# Patient Record
Sex: Male | Born: 1945 | Race: White | Hispanic: No | Marital: Married | Smoking: Former smoker
Health system: Southern US, Community
[De-identification: ages and names within clinical notes are randomized; demographics above are authoritative.]

## PROBLEM LIST (undated history)

## (undated) ENCOUNTER — Emergency Department (HOSPITAL_BASED_OUTPATIENT_CLINIC_OR_DEPARTMENT_OTHER): Payer: Medicare Other

## (undated) DIAGNOSIS — Z9981 Dependence on supplemental oxygen: Secondary | ICD-10-CM

## (undated) DIAGNOSIS — M1990 Unspecified osteoarthritis, unspecified site: Secondary | ICD-10-CM

## (undated) DIAGNOSIS — IMO0001 Reserved for inherently not codable concepts without codable children: Secondary | ICD-10-CM

## (undated) DIAGNOSIS — G2581 Restless legs syndrome: Secondary | ICD-10-CM

## (undated) DIAGNOSIS — N200 Calculus of kidney: Secondary | ICD-10-CM

## (undated) DIAGNOSIS — I4892 Unspecified atrial flutter: Secondary | ICD-10-CM

## (undated) DIAGNOSIS — J449 Chronic obstructive pulmonary disease, unspecified: Secondary | ICD-10-CM

## (undated) HISTORY — PX: KNEE SURGERY: SHX244

---

## 2002-03-29 ENCOUNTER — Ambulatory Visit (HOSPITAL_COMMUNITY): Admission: RE | Admit: 2002-03-29 | Discharge: 2002-03-29 | Payer: Self-pay | Admitting: Family Medicine

## 2002-03-29 ENCOUNTER — Encounter: Payer: Self-pay | Admitting: Family Medicine

## 2003-12-20 ENCOUNTER — Ambulatory Visit (HOSPITAL_COMMUNITY): Admission: RE | Admit: 2003-12-20 | Discharge: 2003-12-20 | Payer: Self-pay | Admitting: Family Medicine

## 2005-08-06 ENCOUNTER — Ambulatory Visit (HOSPITAL_COMMUNITY): Admission: RE | Admit: 2005-08-06 | Discharge: 2005-08-06 | Payer: Self-pay | Admitting: Family Medicine

## 2007-02-26 ENCOUNTER — Ambulatory Visit (HOSPITAL_COMMUNITY): Admission: RE | Admit: 2007-02-26 | Discharge: 2007-02-26 | Payer: Self-pay | Admitting: Family Medicine

## 2007-03-10 ENCOUNTER — Ambulatory Visit (HOSPITAL_COMMUNITY): Admission: RE | Admit: 2007-03-10 | Discharge: 2007-03-10 | Payer: Self-pay | Admitting: Family Medicine

## 2008-04-24 ENCOUNTER — Ambulatory Visit (HOSPITAL_COMMUNITY): Admission: RE | Admit: 2008-04-24 | Discharge: 2008-04-24 | Payer: Self-pay | Admitting: Family Medicine

## 2010-07-24 ENCOUNTER — Ambulatory Visit (HOSPITAL_COMMUNITY)
Admission: RE | Admit: 2010-07-24 | Discharge: 2010-07-24 | Disposition: A | Discharge status: Home / Self Care | Payer: BC Managed Care – PPO | Source: Ambulatory Visit | Attending: Internal Medicine | Admitting: Internal Medicine

## 2010-07-24 ENCOUNTER — Encounter (HOSPITAL_COMMUNITY): Payer: Self-pay

## 2010-07-24 ENCOUNTER — Other Ambulatory Visit (HOSPITAL_COMMUNITY): Payer: Self-pay | Admitting: Internal Medicine

## 2010-07-24 DIAGNOSIS — R059 Cough, unspecified: Secondary | ICD-10-CM | POA: Insufficient documentation

## 2010-07-24 DIAGNOSIS — J438 Other emphysema: Secondary | ICD-10-CM

## 2010-07-24 DIAGNOSIS — R05 Cough: Secondary | ICD-10-CM | POA: Insufficient documentation

## 2010-07-24 DIAGNOSIS — R0602 Shortness of breath: Secondary | ICD-10-CM | POA: Insufficient documentation

## 2010-07-24 HISTORY — DX: Chronic obstructive pulmonary disease, unspecified: J44.9

## 2010-07-26 ENCOUNTER — Other Ambulatory Visit (HOSPITAL_COMMUNITY): Payer: Self-pay | Admitting: Internal Medicine

## 2010-07-26 DIAGNOSIS — Z0389 Encounter for observation for other suspected diseases and conditions ruled out: Secondary | ICD-10-CM

## 2010-07-26 DIAGNOSIS — R61 Generalized hyperhidrosis: Secondary | ICD-10-CM

## 2010-07-30 ENCOUNTER — Encounter (HOSPITAL_COMMUNITY): Payer: Self-pay

## 2010-07-30 ENCOUNTER — Ambulatory Visit (HOSPITAL_COMMUNITY)
Admission: RE | Admit: 2010-07-30 | Discharge: 2010-07-30 | Disposition: A | Discharge status: Home / Self Care | Payer: BC Managed Care – PPO | Source: Ambulatory Visit | Attending: Internal Medicine | Admitting: Internal Medicine

## 2010-07-30 DIAGNOSIS — Z0389 Encounter for observation for other suspected diseases and conditions ruled out: Secondary | ICD-10-CM

## 2010-07-30 DIAGNOSIS — R61 Generalized hyperhidrosis: Secondary | ICD-10-CM

## 2010-07-30 DIAGNOSIS — R9389 Abnormal findings on diagnostic imaging of other specified body structures: Secondary | ICD-10-CM | POA: Insufficient documentation

## 2010-08-05 ENCOUNTER — Other Ambulatory Visit (HOSPITAL_COMMUNITY): Payer: Self-pay | Admitting: Internal Medicine

## 2010-08-05 DIAGNOSIS — R634 Abnormal weight loss: Secondary | ICD-10-CM

## 2010-08-05 DIAGNOSIS — R9389 Abnormal findings on diagnostic imaging of other specified body structures: Secondary | ICD-10-CM

## 2010-08-09 ENCOUNTER — Ambulatory Visit (HOSPITAL_COMMUNITY)
Admission: RE | Admit: 2010-08-09 | Discharge: 2010-08-09 | Disposition: A | Discharge status: Home / Self Care | Payer: BC Managed Care – PPO | Source: Ambulatory Visit | Attending: Internal Medicine | Admitting: Internal Medicine

## 2010-08-09 DIAGNOSIS — J449 Chronic obstructive pulmonary disease, unspecified: Secondary | ICD-10-CM | POA: Insufficient documentation

## 2010-08-09 DIAGNOSIS — R9389 Abnormal findings on diagnostic imaging of other specified body structures: Secondary | ICD-10-CM

## 2010-08-09 DIAGNOSIS — R634 Abnormal weight loss: Secondary | ICD-10-CM

## 2010-08-09 DIAGNOSIS — J4489 Other specified chronic obstructive pulmonary disease: Secondary | ICD-10-CM | POA: Insufficient documentation

## 2010-08-09 DIAGNOSIS — F17200 Nicotine dependence, unspecified, uncomplicated: Secondary | ICD-10-CM | POA: Insufficient documentation

## 2010-08-09 DIAGNOSIS — R0609 Other forms of dyspnea: Secondary | ICD-10-CM | POA: Insufficient documentation

## 2010-08-09 DIAGNOSIS — R0989 Other specified symptoms and signs involving the circulatory and respiratory systems: Secondary | ICD-10-CM | POA: Insufficient documentation

## 2010-08-13 ENCOUNTER — Telehealth: Payer: Self-pay

## 2010-08-13 DIAGNOSIS — Z139 Encounter for screening, unspecified: Secondary | ICD-10-CM

## 2010-08-15 ENCOUNTER — Telehealth: Payer: Self-pay

## 2010-08-15 NOTE — Telephone Encounter (Signed)
Gastroenterology Pre-Procedure Form  Request Date: 08/15/2010,  Requesting Physician: Dr. Sherwood Gambler     PATIENT INFORMATION:  David Stein is a 65 y.o., male (DOB=Jul 29, 1945).  PROCEDURE: Procedure(s) requested: colonoscopy Procedure Reason: screening for colon cancer  PATIENT REVIEW QUESTIONS: The patient reports the following:   1. Diabetes Melitis: no 2. Joint replacements in the past 12 months: no 3. Major health problems in the past 3 months: no 4. Has an artificial valve or MVP:no 5. Has been advised in past to take antibiotics in advance of a procedure like teeth cleaning: no}    MEDICATIONS & ALLERGIES:    Patient reports the following regarding taking any blood thinners:   Plavix? no Aspirin?no Coumadin?  no  Patient confirms/reports the following medications:  Current Outpatient Prescriptions  Medication Sig Dispense Refill  . albuterol (PROVENTIL) (2.5 MG/3ML) 0.083% nebulizer solution Take 2.5 mg by nebulization every 6 (six) hours as needed.        . predniSONE (DELTASONE) 10 MG tablet pack Take 10 mg by mouth daily.        . predniSONE (DELTASONE) 5 MG tablet Take 5 mg by mouth daily. Pt unsure of the dosage       . senna (SENOKOT) 8.6 MG tablet Take 1 tablet by mouth daily.          Patient confirms/reports the following allergies:  No Known Allergies  Patient is appropriate to schedule for requested procedure(s): yes  AUTHORIZATION INFORMATION Primary Insurance: ,  ID #:,  Group #: Pre-Cert / Auth required: Pre-Cert / Auth #:  Secondary Insurance Pre-Cert / Auth required: Pre-Cert / Auth #:  No orders of the defined types were placed in this encounter.    SCHEDULE INFORMATION: Procedure has been scheduled as follows:  Date: 08/20/2010, Time: 12:30 pm  Location: [facility] Short Stay  This Gastroenterology Pre-Precedure Form is being routed to the following provider(s) for review: R. Roetta Sessions, MD

## 2010-08-15 NOTE — Telephone Encounter (Signed)
ERROR. ALREADY HAD ENCOUNTER OPEN FOR THE TRIAGE FOR THIS PT.

## 2010-08-15 NOTE — Telephone Encounter (Signed)
OK to proceed with colonoscopy.

## 2010-08-16 ENCOUNTER — Encounter: Payer: Self-pay | Admitting: Internal Medicine

## 2010-08-20 ENCOUNTER — Other Ambulatory Visit: Payer: Self-pay | Admitting: Internal Medicine

## 2010-08-20 ENCOUNTER — Ambulatory Visit (HOSPITAL_COMMUNITY)
Admission: RE | Admit: 2010-08-20 | Discharge: 2010-08-20 | Disposition: A | Discharge status: Home / Self Care | Payer: BC Managed Care – PPO | Source: Ambulatory Visit | Attending: Internal Medicine | Admitting: Internal Medicine

## 2010-08-20 ENCOUNTER — Encounter: Payer: BC Managed Care – PPO | Admitting: Internal Medicine

## 2010-08-20 DIAGNOSIS — Z1211 Encounter for screening for malignant neoplasm of colon: Secondary | ICD-10-CM

## 2010-08-20 DIAGNOSIS — J4489 Other specified chronic obstructive pulmonary disease: Secondary | ICD-10-CM | POA: Insufficient documentation

## 2010-08-20 DIAGNOSIS — D126 Benign neoplasm of colon, unspecified: Secondary | ICD-10-CM

## 2010-08-20 DIAGNOSIS — J449 Chronic obstructive pulmonary disease, unspecified: Secondary | ICD-10-CM | POA: Insufficient documentation

## 2010-08-29 ENCOUNTER — Encounter: Payer: Self-pay | Admitting: Internal Medicine

## 2010-10-07 NOTE — Op Note (Signed)
  NAME:  David Stein, David Stein                 ACCOUNT NO.:  [id number]  MEDICAL RECORD NO.:  [id number]           PATIENT TYPE:  O  LOCATION:  [facility]                          FACILITY:  [facility]  PHYSICIAN:  R. Roetta Sessions, M.D. DATE OF BIRTH:  January 01, 1946  DATE OF PROCEDURE:  08/20/2010 DATE OF DISCHARGE:                              OPERATIVE REPORT   PROCEDURE:  Colonoscopy with biopsy and colonoscopy with snare polypectomy.  INDICATIONS FOR PROCEDURE:  A 65-year gentleman who presents in referral by Dr. Karleen Hampshire, for his first ever screening colonoscopy.  Mr. Mccaslin is devoid of any lower GI tract symptoms.  No family history of colon cancer biopsy of first or secondary relatives.  Colonoscopy is now being done.  Risks, benefits, limitations, alternatives, and imponderables have been discussed, questions answered.  Please see the documentation medical record.  PROCEDURE NOTE:  O2 saturation, blood pressure, pulse, and respirationsmonitored throughout the entire procedure.  CONSCIOUS SEDATION:  Versed 3 mg IV and Demerol 75 mg IV in divided doses.  INSTRUMENT:  Pentax video chip system.  Digital rectal exam revealed no abnormalities.  Endoscopic findings: The prep was good.  Colon:  Pancolonic mucosa was surveyed from the rectosigmoid junction through the left transverse right colon to the appendiceal orifice, ileocecal valve/cecum.  These structures were well seen and photographed for the record.  From this level, scope was slowly and cautiously withdrawn.  All previously mentioned mucosal surfaces were again seen.  On the way in the mid transverse colon, there was a 5- minute polyp was cold snared, recovered just distal to ileocecal valve, there was a single diminutive polyp was cold biopsied.  Remainder of the colonic mucosa appeared normal.  Scope was pulled down the rectum where thorough examination of the rectal mucosa including retroflex view of anal verge demonstrated no  abnormalities.  The patient tolerated the procedure well.  Cecal withdrawal time 10 minutes.  IMPRESSION: 1. Normal rectum. 2. Polyps in the ascending and transverse segments removed as     described above.  RECOMMENDATIONS: 1. Literature on polyps provided to Mr. Shrewsberry. 2. Follow up on path. 3. Further recommendations to follow.     Jonathon Bellows, M.D.     RMR/MEDQ  D:  08/20/2010  T:  08/21/2010  Job:  [id number]  cc:   Kirk Ruths, M.D. Fax: [phone]  Electronically Signed by Lorrin Goodell M.D. on 10/07/2010 08:35:10 AM

## 2011-01-20 ENCOUNTER — Ambulatory Visit (HOSPITAL_COMMUNITY)
Admission: RE | Admit: 2011-01-20 | Discharge: 2011-01-20 | Disposition: A | Discharge status: Home / Self Care | Payer: Medicare Other | Source: Ambulatory Visit | Attending: Family Medicine | Admitting: Family Medicine

## 2011-01-20 ENCOUNTER — Other Ambulatory Visit (HOSPITAL_COMMUNITY): Payer: Self-pay | Admitting: Family Medicine

## 2011-01-20 DIAGNOSIS — R0989 Other specified symptoms and signs involving the circulatory and respiratory systems: Secondary | ICD-10-CM | POA: Insufficient documentation

## 2011-01-20 DIAGNOSIS — J449 Chronic obstructive pulmonary disease, unspecified: Secondary | ICD-10-CM

## 2011-01-20 DIAGNOSIS — J209 Acute bronchitis, unspecified: Secondary | ICD-10-CM

## 2011-01-20 DIAGNOSIS — J40 Bronchitis, not specified as acute or chronic: Secondary | ICD-10-CM | POA: Insufficient documentation

## 2011-01-20 DIAGNOSIS — J438 Other emphysema: Secondary | ICD-10-CM | POA: Insufficient documentation

## 2011-01-20 DIAGNOSIS — R0609 Other forms of dyspnea: Secondary | ICD-10-CM | POA: Insufficient documentation

## 2011-01-20 DIAGNOSIS — J4489 Other specified chronic obstructive pulmonary disease: Secondary | ICD-10-CM

## 2011-03-12 ENCOUNTER — Other Ambulatory Visit (HOSPITAL_COMMUNITY): Payer: Self-pay | Admitting: Pulmonary Disease

## 2011-03-14 ENCOUNTER — Ambulatory Visit (HOSPITAL_COMMUNITY)
Admission: RE | Admit: 2011-03-14 | Discharge: 2011-03-14 | Disposition: A | Discharge status: Home / Self Care | Payer: Medicare Other | Source: Ambulatory Visit | Attending: Pulmonary Disease | Admitting: Pulmonary Disease

## 2011-03-14 DIAGNOSIS — R0602 Shortness of breath: Secondary | ICD-10-CM | POA: Insufficient documentation

## 2011-03-14 LAB — BLOOD GAS, ARTERIAL
Acid-Base Excess: 3.2 mmol/L — ABNORMAL HIGH (ref 0.0–2.0)
Patient temperature: 37
TCO2: 23.4 mmol/L (ref 0–100)

## 2011-03-14 MED ORDER — ALBUTEROL SULFATE (5 MG/ML) 0.5% IN NEBU
2.5000 mg | INHALATION_SOLUTION | Freq: Once | RESPIRATORY_TRACT | Status: AC
  Administered 2011-03-14: 2.5 mg via RESPIRATORY_TRACT

## 2011-03-14 MED ORDER — ALBUTEROL SULFATE (5 MG/ML) 0.5% IN NEBU: 2.5000 mg | INHALATION_SOLUTION | Freq: Once | RESPIRATORY_TRACT | Status: DC

## 2011-03-17 NOTE — Procedures (Signed)
David Stein, David Stein                 ACCOUNT NO.:  [id number]  MEDICAL RECORD NO.:  [id number]  LOCATION:                                 FACILITY:  PHYSICIAN:  Chrisann Melaragno L. Juanetta Gosling, M.D.DATE OF BIRTH:  Nov 06, 1945  DATE OF PROCEDURE: DATE OF DISCHARGE:                           PULMONARY FUNCTION TEST   Reason for pulmonary function testing is shortness of breath.  1. Spirometry shows severe ventilatory defect with airflow     obstruction. 2. Lung volumes show air trapping. 3. DLCO is severely reduced. 4. Arterial blood gas shows borderline hypoxia and CO2 is normal. 5. There is significant bronchodilator improvement. 6. This is consistent with COPD.     Brionne Mertz L. Juanetta Gosling, M.D.     ELH/MEDQ  D:  03/17/2011  T:  03/17/2011  Job:  [id number]

## 2012-09-12 IMAGING — CT CT CHEST W/O CM
2 of 3 series · 15 of 36 positions shown, 18 images · non-contrast
Comparison: None

CLINICAL DATA: COPD, difficulty breathing, smoker

CT CHEST WITHOUT CONTRAST
TECHNIQUE: Multidetector CT imaging of the chest was performed
following the standard protocol without IV contrast.

[Series 2: chestroutine 5.0 b40f · axial · 0.66mm/px · z∈[-404,-79]mm · 12 of 77 slices shown, 15 images]
[im 6/77  mediastinal]
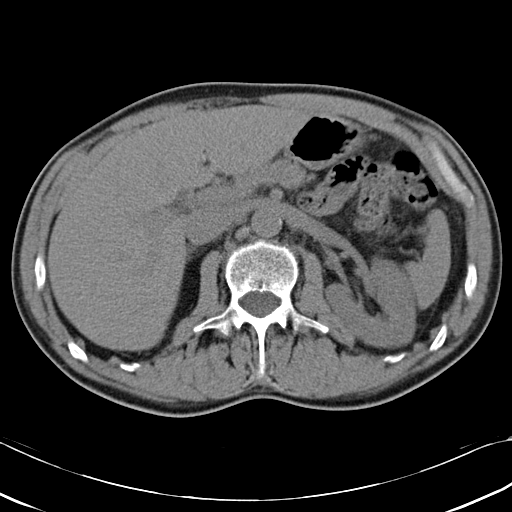
[im 6/77  lung]
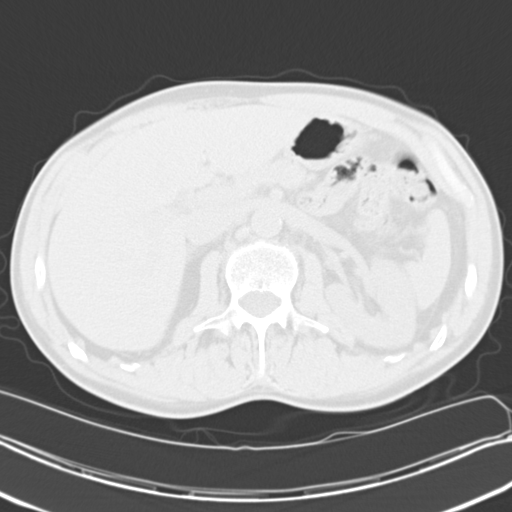
[im 12/77  lung]
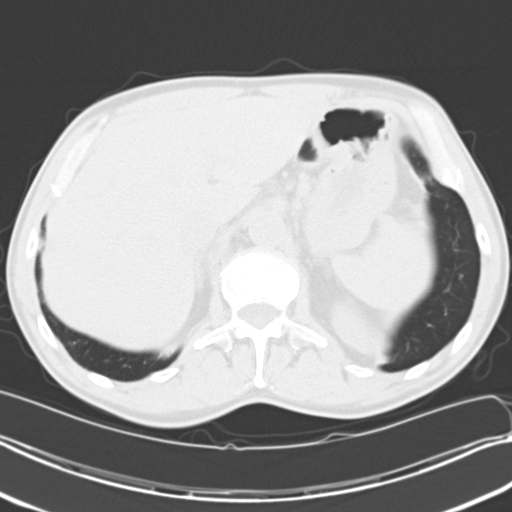
[im 17/77  lung]
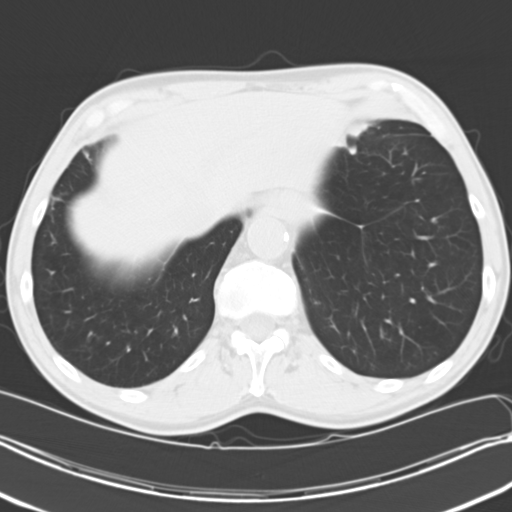
[im 23/77  lung]
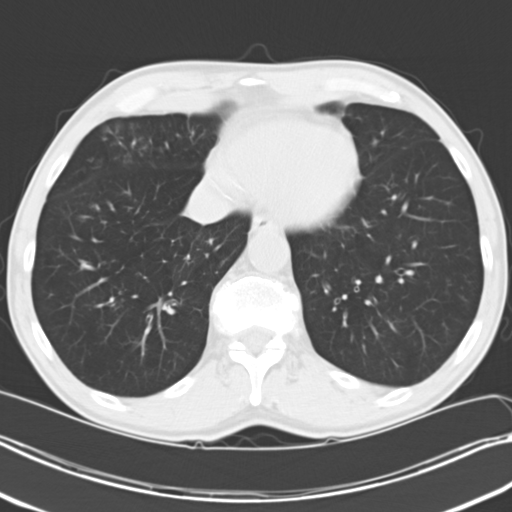
[im 29/77  mediastinal]
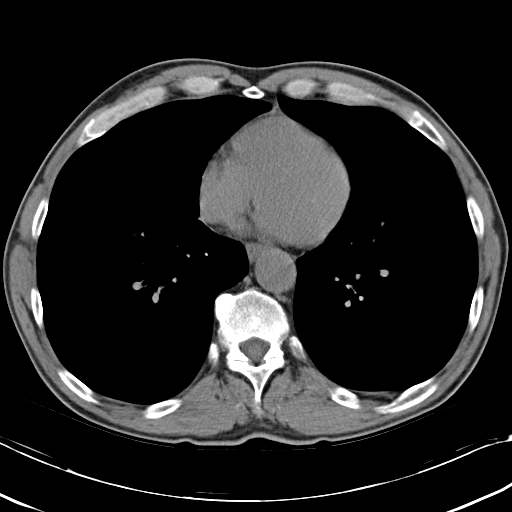
[im 29/77  lung]
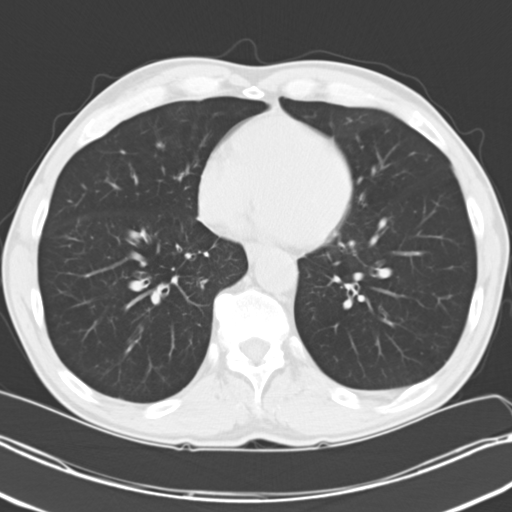
[im 34/77  lung]
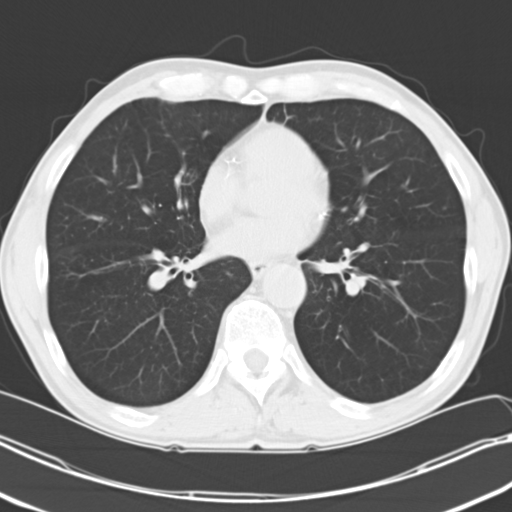
[im 43/77  lung]
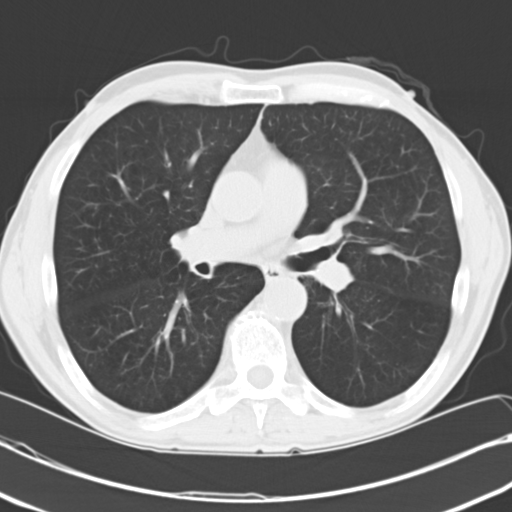
[im 48/77  lung]
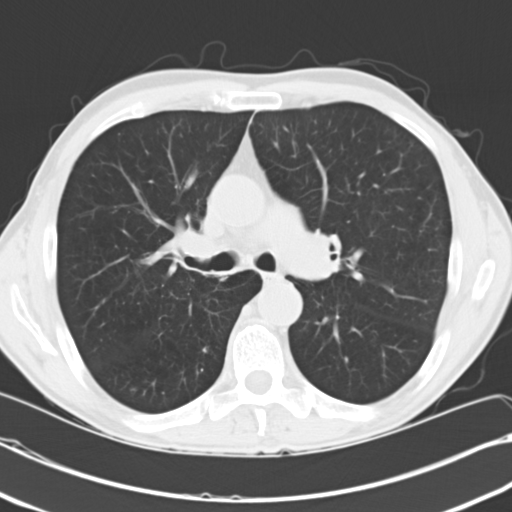
[im 54/77  mediastinal]
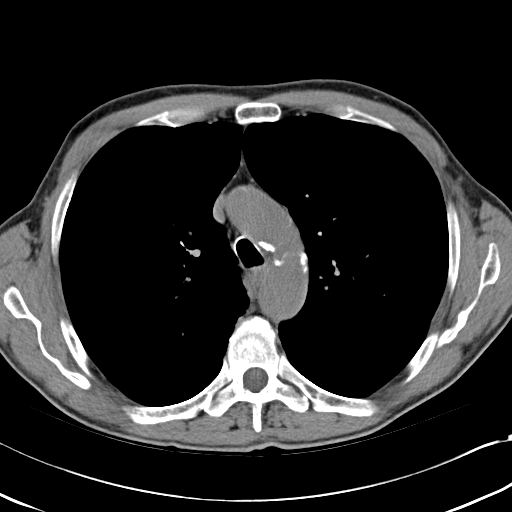
[im 54/77  lung]
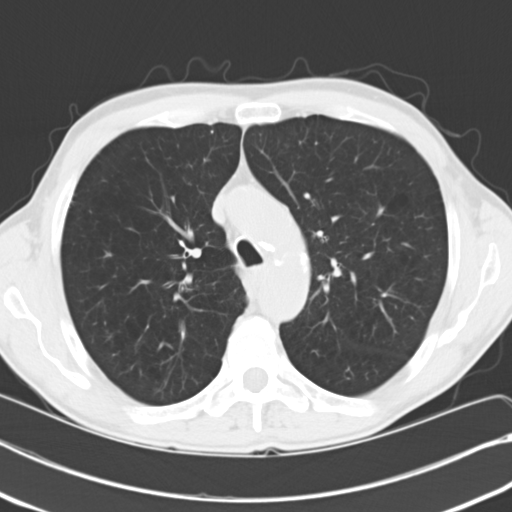
[im 60/77  lung]
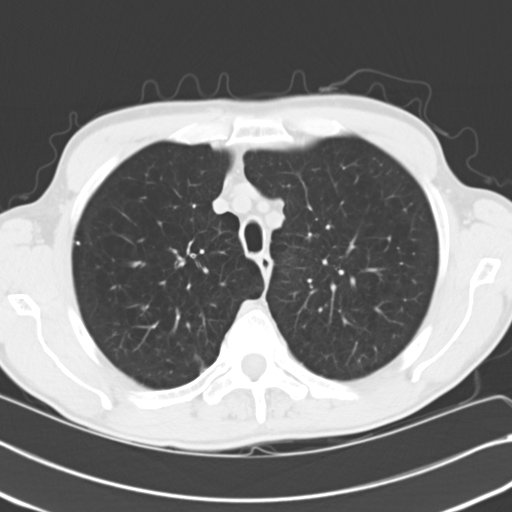
[im 65/77  lung]
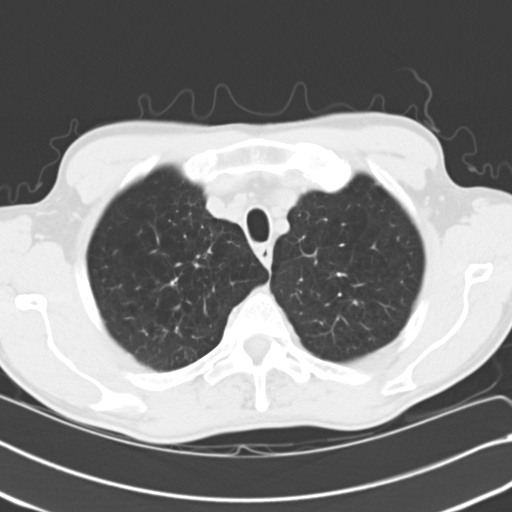
[im 71/77  lung]
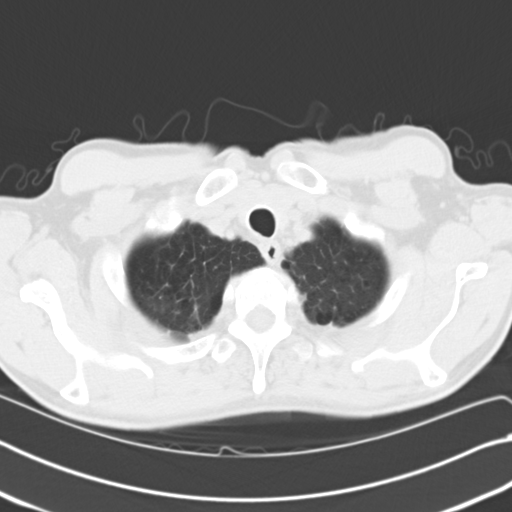

[Series 4: mpr coro 3mm · coronal · 0.67mm/px · 3 of 81 slices shown]
[im 17/81  lung]
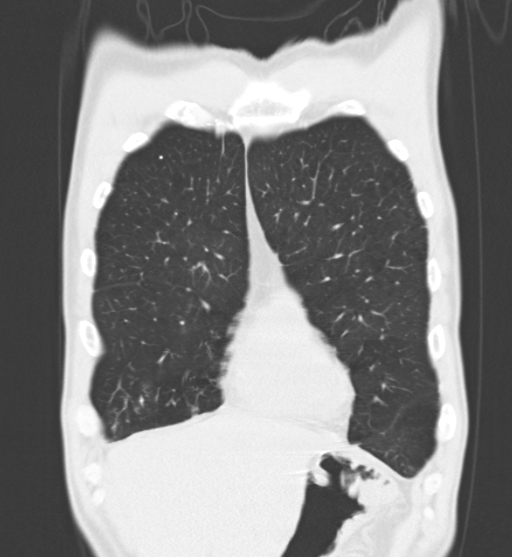
[im 33/81  lung]
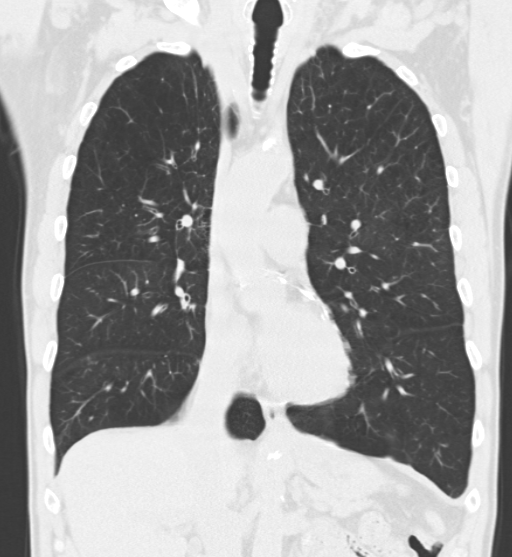
[im 49/81  lung]
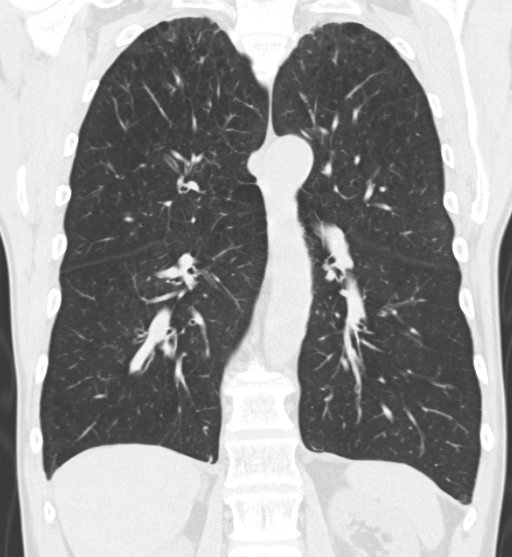

[15 of 36 positions shown; findings below may reference images not displayed]

FINDINGS: Scattered atherosclerotic calcifications of aorta and coronary
arteries.
Thoracic aorta normal caliber.
Visualized portion of upper abdomen normal.
Numerous tiny calcified pulmonary granulomata bilaterally.
Few additional tiny nodules are questionably calcified.
Severe emphysematous changes.
Scattered peribronchial thickening.
No definite infiltrate, effusion, or mass.
No acute osseous findings.
IMPRESSION: Emphysematous and bronchitic changes.
Old granulomatous disease changes.
No acute intrathoracic abnormalities.

## 2012-09-28 ENCOUNTER — Other Ambulatory Visit (HOSPITAL_COMMUNITY): Payer: Self-pay | Admitting: Pulmonary Disease

## 2012-09-28 ENCOUNTER — Ambulatory Visit (HOSPITAL_COMMUNITY)
Admission: RE | Admit: 2012-09-28 | Discharge: 2012-09-28 | Disposition: A | Discharge status: Home / Self Care | Payer: Medicare Other | Source: Ambulatory Visit | Attending: Pulmonary Disease | Admitting: Pulmonary Disease

## 2012-09-28 DIAGNOSIS — M7989 Other specified soft tissue disorders: Secondary | ICD-10-CM

## 2012-09-28 DIAGNOSIS — M25579 Pain in unspecified ankle and joints of unspecified foot: Secondary | ICD-10-CM | POA: Insufficient documentation

## 2012-09-28 DIAGNOSIS — M79605 Pain in left leg: Secondary | ICD-10-CM

## 2012-09-28 DIAGNOSIS — M1120 Other chondrocalcinosis, unspecified site: Secondary | ICD-10-CM | POA: Insufficient documentation

## 2012-09-28 DIAGNOSIS — M25559 Pain in unspecified hip: Secondary | ICD-10-CM | POA: Insufficient documentation

## 2012-09-28 DIAGNOSIS — M25569 Pain in unspecified knee: Secondary | ICD-10-CM | POA: Insufficient documentation

## 2012-09-28 DIAGNOSIS — M79609 Pain in unspecified limb: Secondary | ICD-10-CM | POA: Insufficient documentation

## 2013-04-28 ENCOUNTER — Ambulatory Visit (HOSPITAL_COMMUNITY)
Admission: RE | Admit: 2013-04-28 | Discharge: 2013-04-28 | Disposition: A | Discharge status: Home / Self Care | Payer: Medicare Other | Source: Ambulatory Visit | Attending: Pulmonary Disease | Admitting: Pulmonary Disease

## 2013-04-28 ENCOUNTER — Other Ambulatory Visit (HOSPITAL_COMMUNITY): Payer: Self-pay | Admitting: Pulmonary Disease

## 2013-04-28 DIAGNOSIS — J449 Chronic obstructive pulmonary disease, unspecified: Secondary | ICD-10-CM | POA: Insufficient documentation

## 2013-04-28 DIAGNOSIS — J8410 Pulmonary fibrosis, unspecified: Secondary | ICD-10-CM | POA: Insufficient documentation

## 2013-04-28 DIAGNOSIS — J4489 Other specified chronic obstructive pulmonary disease: Secondary | ICD-10-CM | POA: Insufficient documentation

## 2013-04-28 DIAGNOSIS — R0602 Shortness of breath: Secondary | ICD-10-CM

## 2013-08-22 ENCOUNTER — Other Ambulatory Visit (HOSPITAL_COMMUNITY): Payer: Self-pay | Admitting: Family Medicine

## 2013-08-22 DIAGNOSIS — Z0000 Encounter for general adult medical examination without abnormal findings: Secondary | ICD-10-CM

## 2013-08-22 DIAGNOSIS — J449 Chronic obstructive pulmonary disease, unspecified: Secondary | ICD-10-CM

## 2013-08-22 DIAGNOSIS — F17200 Nicotine dependence, unspecified, uncomplicated: Secondary | ICD-10-CM

## 2013-08-26 ENCOUNTER — Ambulatory Visit (HOSPITAL_COMMUNITY)
Admission: RE | Admit: 2013-08-26 | Discharge: 2013-08-26 | Disposition: A | Discharge status: Home / Self Care | Payer: Medicare Other | Source: Ambulatory Visit | Attending: Family Medicine | Admitting: Family Medicine

## 2013-08-26 DIAGNOSIS — F17200 Nicotine dependence, unspecified, uncomplicated: Secondary | ICD-10-CM

## 2013-08-26 DIAGNOSIS — Z0000 Encounter for general adult medical examination without abnormal findings: Secondary | ICD-10-CM

## 2013-08-26 DIAGNOSIS — Z0389 Encounter for observation for other suspected diseases and conditions ruled out: Secondary | ICD-10-CM | POA: Insufficient documentation

## 2013-08-26 DIAGNOSIS — J449 Chronic obstructive pulmonary disease, unspecified: Secondary | ICD-10-CM

## 2014-01-18 ENCOUNTER — Encounter (HOSPITAL_COMMUNITY): Payer: Self-pay | Admitting: Pharmacy Technician

## 2014-01-25 NOTE — Patient Instructions (Signed)
David Stein  01/25/2014   Your procedure is scheduled on:  01/31/14  Report to [facility] at 0800 AM.  Call this number if you have problems the morning of surgery: [phone]   Remember:   Do not eat food or drink liquids after midnight.   Take these medicines the morning of surgery with A SIP OF WATER: pressair, albuterol, symbicort inhalers & bring them with you. Also take your prednisone.   Do not wear jewelry, make-up or nail polish.  Do not wear lotions, powders, or perfumes. You may wear deodorant.  Do not shave 48 hours prior to surgery. Men may shave face and neck.  Do not bring valuables to the hospital.  [facility] is not responsible                  for any belongings or valuables.               Contacts, dentures or bridgework may not be worn into surgery.  Leave suitcase in the car. After surgery it may be brought to your room.  For patients admitted to the hospital, discharge time is determined by your                treatment team.               Patients discharged the day of surgery will not be allowed to drive  home.  Name and phone number of your driver: family  Special Instructions: N/A   Please read over the following fact sheets that you were given: Anesthesia Post-op Instructions and Care and Recovery After Surgery    PATIENT INSTRUCTIONS POST-ANESTHESIA  IMMEDIATELY FOLLOWING SURGERY:  Do not drive or operate machinery for the first twenty four hours after surgery.  Do not make any important decisions for twenty four hours after surgery or while taking narcotic pain medications or sedatives.  If you develop intractable nausea and vomiting or a severe headache please notify your doctor immediately.  FOLLOW-UP:  Please make an appointment with your surgeon as instructed. You do not need to follow up with anesthesia unless specifically instructed to do so.  WOUND CARE INSTRUCTIONS (if applicable):  Keep a dry clean dressing on the anesthesia/puncture wound  site if there is drainage.  Once the wound has quit draining you may leave it open to air.  Generally you should leave the bandage intact for twenty four hours unless there is drainage.  If the epidural site drains for more than 36-48 hours please call the anesthesia department.  QUESTIONS?:  Please feel free to call your physician or the hospital operator if you have any questions, and they will be happy to assist you.      Cataract A cataract is a clouding of the lens of the eye. When a lens becomes cloudy, vision is reduced based on the degree and nature of the clouding. Many cataracts reduce vision to some degree. Some cataracts make people more near-sighted as they develop. Other cataracts increase glare. Cataracts that are ignored and become worse can sometimes look white. The white color can be seen through the pupil. CAUSES   Aging. However, cataracts may occur at any age, even in newborns.  Certain drugs.  Trauma to the eye.  Certain diseases such as diabetes.  Specific eye diseases such as chronic inflammation inside the eye or a sudden attack of a rare form of glaucoma.  Inherited or acquired medical problems. SYMPTOMS   Gradual, progressive  drop in vision in the affected eye.  Severe, rapid visual loss. This most often happens when trauma is the cause. DIAGNOSIS  To detect a cataract, an eye doctor examines the lens. Cataracts are best diagnosed with an exam of the eyes with the pupils enlarged (dilated) by drops.  TREATMENT  For an early cataract, vision may improve by using different eyeglasses or stronger lighting. If that does not help your vision, surgery is the only effective treatment. A cataract needs to be surgically removed when vision loss interferes with your everyday activities, such as driving, reading, or watching TV. A cataract may also have to be removed if it prevents examination or treatment of another eye problem. Surgery removes the cloudy lens and usually  replaces it with a substitute lens (intraocular lens, IOL).  At a time when both you and your doctor agree, the cataract will be surgically removed. If you have cataracts in both eyes, only one is usually removed at a time. This allows the operated eye to heal and be out of danger from any possible problems after surgery (such as infection or poor wound healing). In rare cases, a cataract may be doing damage to your eye. In these cases, your caregiver may advise surgical removal right away. The vast majority of people who have cataract surgery have better vision afterward. HOME CARE INSTRUCTIONS  If you are not planning surgery, you may be asked to do the following:  Use different eyeglasses.  Use stronger or brighter lighting.  Ask your eye doctor about reducing your medicine dose or changing medicines if it is thought that a medicine caused your cataract. Changing medicines does not make the cataract go away on its own.  Become familiar with your surroundings. Poor vision can lead to injury. Avoid bumping into things on the affected side. You are at a higher risk for tripping or falling.  Exercise extreme care when driving or operating machinery.  Wear sunglasses if you are sensitive to bright light or experiencing problems with glare. SEEK IMMEDIATE MEDICAL CARE IF:   You have a worsening or sudden vision loss.  You notice redness, swelling, or increasing pain in the eye.  You have a fever. Document Released: 03/10/2005 Document Revised: 06/02/2011 Document Reviewed: 11/01/2010 [facility] Patient Information 2015 [place], [place]. This information is not intended to replace advice given to you by your health care provider. Make sure you discuss any questions you have with your health care provider.

## 2014-01-26 ENCOUNTER — Encounter (HOSPITAL_COMMUNITY)
Admission: RE | Admit: 2014-01-26 | Discharge: 2014-01-26 | Disposition: A | Discharge status: Home / Self Care | Payer: Medicare Other | Source: Ambulatory Visit | Attending: Ophthalmology | Admitting: Ophthalmology

## 2014-01-26 ENCOUNTER — Other Ambulatory Visit: Payer: Self-pay

## 2014-01-26 ENCOUNTER — Encounter (HOSPITAL_COMMUNITY): Payer: Self-pay

## 2014-01-26 DIAGNOSIS — Z01818 Encounter for other preprocedural examination: Secondary | ICD-10-CM | POA: Diagnosis present

## 2014-01-26 HISTORY — DX: Restless legs syndrome: G25.81

## 2014-01-26 HISTORY — DX: Calculus of kidney: N20.0

## 2014-01-26 HISTORY — DX: Dependence on supplemental oxygen: Z99.81

## 2014-01-26 HISTORY — DX: Unspecified osteoarthritis, unspecified site: M19.90

## 2014-01-26 HISTORY — DX: Reserved for inherently not codable concepts without codable children: IMO0001

## 2014-01-26 LAB — BASIC METABOLIC PANEL
ANION GAP: 7 (ref 5–15)
BUN: 10 mg/dL (ref 6–23)
CHLORIDE: 96 meq/L (ref 96–112)
CO2: 38 meq/L — AB (ref 19–32)
CREATININE: 0.75 mg/dL (ref 0.50–1.35)
Calcium: 9.4 mg/dL (ref 8.4–10.5)
GFR calc non Af Amer: >90 mL/min (ref >90)
Glucose, Bld: 127 mg/dL — ABNORMAL HIGH (ref 70–99)
POTASSIUM: 4.2 meq/L (ref 3.7–5.3)
SODIUM: 141 meq/L (ref 137–147)

## 2014-01-26 LAB — HEMOGLOBIN AND HEMATOCRIT, BLOOD
HCT: 40.2 % (ref 39.0–52.0)
HEMOGLOBIN: 12.7 g/dL — AB (ref 13.0–17.0)

## 2014-01-26 NOTE — Pre-Procedure Instructions (Signed)
Pt. Given info for MyChart. To be set up at home. 

## 2014-01-30 MED ORDER — TETRACAINE HCL 0.5 % OP SOLN
OPHTHALMIC | Status: AC
  Filled 2014-01-30: qty 2

## 2014-01-30 MED ORDER — KETOROLAC TROMETHAMINE 0.5 % OP SOLN
OPHTHALMIC | Status: AC
  Filled 2014-01-30: qty 5

## 2014-01-30 MED ORDER — CYCLOPENTOLATE-PHENYLEPHRINE OP SOLN OPTIME - NO CHARGE
OPHTHALMIC | Status: AC
  Filled 2014-01-30: qty 2

## 2014-01-30 MED ORDER — PHENYLEPHRINE HCL 2.5 % OP SOLN
OPHTHALMIC | Status: AC
  Filled 2014-01-30: qty 15

## 2014-01-31 ENCOUNTER — Encounter (HOSPITAL_COMMUNITY): Payer: Self-pay | Admitting: *Deleted

## 2014-01-31 ENCOUNTER — Ambulatory Visit (HOSPITAL_COMMUNITY)
Admission: RE | Admit: 2014-01-31 | Discharge: 2014-01-31 | Disposition: A | Discharge status: Home / Self Care | Payer: Medicare Other | Source: Ambulatory Visit | Attending: Ophthalmology | Admitting: Ophthalmology

## 2014-01-31 ENCOUNTER — Encounter (HOSPITAL_COMMUNITY)
Admission: RE | Disposition: A | Discharge status: Home / Self Care | Payer: Self-pay | Source: Ambulatory Visit | Attending: Ophthalmology

## 2014-01-31 ENCOUNTER — Ambulatory Visit (HOSPITAL_COMMUNITY): Payer: Medicare Other | Admitting: Anesthesiology

## 2014-01-31 DIAGNOSIS — Z885 Allergy status to narcotic agent status: Secondary | ICD-10-CM | POA: Insufficient documentation

## 2014-01-31 DIAGNOSIS — F17210 Nicotine dependence, cigarettes, uncomplicated: Secondary | ICD-10-CM | POA: Diagnosis not present

## 2014-01-31 DIAGNOSIS — H2511 Age-related nuclear cataract, right eye: Secondary | ICD-10-CM | POA: Diagnosis not present

## 2014-01-31 DIAGNOSIS — J449 Chronic obstructive pulmonary disease, unspecified: Secondary | ICD-10-CM | POA: Insufficient documentation

## 2014-01-31 DIAGNOSIS — M1990 Unspecified osteoarthritis, unspecified site: Secondary | ICD-10-CM | POA: Diagnosis not present

## 2014-01-31 HISTORY — PX: CATARACT EXTRACTION W/PHACO: SHX586

## 2014-01-31 SURGERY — PHACOEMULSIFICATION, CATARACT, WITH IOL INSERTION
Anesthesia: Monitor Anesthesia Care | Laterality: Right

## 2014-01-31 MED ORDER — PHENYLEPHRINE HCL 2.5 % OP SOLN
1.0000 [drp] | OPHTHALMIC | Status: AC
  Administered 2014-01-31 (×3): 1 [drp] via OPHTHALMIC

## 2014-01-31 MED ORDER — CYCLOPENTOLATE-PHENYLEPHRINE 0.2-1 % OP SOLN
1.0000 [drp] | OPHTHALMIC | Status: AC
  Administered 2014-01-31 (×3): 1 [drp] via OPHTHALMIC

## 2014-01-31 MED ORDER — PROVISC 10 MG/ML IO SOLN
INTRAOCULAR | Status: DC | PRN
  Administered 2014-01-31: 0.85 mL via INTRAOCULAR

## 2014-01-31 MED ORDER — TETRACAINE 0.5 % OP SOLN OPTIME - NO CHARGE
OPHTHALMIC | Status: DC | PRN
  Administered 2014-01-31: 2 [drp] via OPHTHALMIC

## 2014-01-31 MED ORDER — DEXAMETHASONE SODIUM PHOSPHATE 4 MG/ML IJ SOLN
4.0000 mg | Freq: Once | INTRAMUSCULAR | Status: AC
  Administered 2014-01-31: 4 mg via INTRAVENOUS

## 2014-01-31 MED ORDER — FENTANYL CITRATE 0.05 MG/ML IJ SOLN
INTRAMUSCULAR | Status: AC
  Filled 2014-01-31: qty 2

## 2014-01-31 MED ORDER — LACTATED RINGERS IV SOLN
INTRAVENOUS | Status: DC
  Administered 2014-01-31: 1000 mL via INTRAVENOUS

## 2014-01-31 MED ORDER — KETOROLAC TROMETHAMINE 0.5 % OP SOLN
1.0000 [drp] | OPHTHALMIC | Status: AC
  Administered 2014-01-31 (×3): 1 [drp] via OPHTHALMIC

## 2014-01-31 MED ORDER — EPINEPHRINE HCL 1 MG/ML IJ SOLN
INTRAOCULAR | Status: DC | PRN
  Administered 2014-01-31: 09:00:00

## 2014-01-31 MED ORDER — MIDAZOLAM HCL 2 MG/2ML IJ SOLN
INTRAMUSCULAR | Status: AC
  Filled 2014-01-31: qty 2

## 2014-01-31 MED ORDER — ONDANSETRON HCL 4 MG/2ML IJ SOLN
INTRAMUSCULAR | Status: AC
  Filled 2014-01-31: qty 2

## 2014-01-31 MED ORDER — FENTANYL CITRATE 0.05 MG/ML IJ SOLN
25.0000 ug | INTRAMUSCULAR | Status: AC
  Administered 2014-01-31 (×2): 25 ug via INTRAVENOUS

## 2014-01-31 MED ORDER — DEXAMETHASONE SODIUM PHOSPHATE 4 MG/ML IJ SOLN
INTRAMUSCULAR | Status: AC
  Filled 2014-01-31: qty 1

## 2014-01-31 MED ORDER — TETRACAINE HCL 0.5 % OP SOLN
1.0000 [drp] | OPHTHALMIC | Status: AC
  Administered 2014-01-31 (×3): 1 [drp] via OPHTHALMIC

## 2014-01-31 MED ORDER — MIDAZOLAM HCL 2 MG/2ML IJ SOLN
1.0000 mg | INTRAMUSCULAR | Status: DC | PRN
  Administered 2014-01-31: 2 mg via INTRAVENOUS

## 2014-01-31 MED ORDER — BSS IO SOLN
INTRAOCULAR | Status: DC | PRN
  Administered 2014-01-31: 15 mL

## 2014-01-31 MED ORDER — EPINEPHRINE HCL 1 MG/ML IJ SOLN
INTRAMUSCULAR | Status: AC
  Filled 2014-01-31: qty 1

## 2014-01-31 SURGICAL SUPPLY — 10 items
CLOTH BEACON ORANGE TIMEOUT ST (SAFETY) ×2 IMPLANT
EYE SHIELD UNIVERSAL CLEAR (GAUZE/BANDAGES/DRESSINGS) ×2 IMPLANT
GLOVE BIO SURGEON STRL SZ 6.5 (GLOVE) ×2 IMPLANT
GLOVE BIO SURGEONS STRL SZ 6.5 (GLOVE) ×1
GLOVE EXAM NITRILE MD LF STRL (GLOVE) ×2 IMPLANT
PAD ARMBOARD 7.5X6 YLW CONV (MISCELLANEOUS) ×2 IMPLANT
SIGHTPATH CAT PROC W REG LENS (Ophthalmic Related) ×3 IMPLANT
TAPE SURG TRANSPORE 1 IN (GAUZE/BANDAGES/DRESSINGS) ×1 IMPLANT
TAPE SURGICAL TRANSPORE 1 IN (GAUZE/BANDAGES/DRESSINGS) ×2
WATER STERILE IRR 250ML POUR (IV SOLUTION) ×2 IMPLANT

## 2014-01-31 NOTE — Discharge Instructions (Signed)
David Stein  01/31/2014           [facility] Instructions [place] [address]      1. Avoid closing eyes tightly. One often closes the eye tightly when laughing, talking, sneezing, coughing or if they feel irritated. At these times, you should be careful not to close your eyes tightly.  2. Instill eye drops as instructed. To instill drops in your eye, open it, look up and have someone gently pull the lower lid down and instill a couple of drops inside the lower lid.  3. Do not touch upper lid.  4. Take Advil or Tylenol for pain.  5. You may use either eye for near work, such as reading or sewing and you may watch television.  6. You may have your hair done at the beauty parlor at any time.  7. Wear dark glasses with or without your own glasses if you are in bright light.  8. Call our office at [phone] or [phone] if you have sharp pain in your eye or unusual symptoms.  9. Do not be concerned because vision in the operative eye is not good. It will not be good, no matter how successful the operation, until you get a special lens for it. Your old glasses will not be suited to the new eye that was operated on and you will not be ready for a new lens for about a month.  10. Follow up at the [facility] office.    I have received a copy of the above instructions and will follow them.      PATIENT INSTRUCTIONS POST-ANESTHESIA  IMMEDIATELY FOLLOWING SURGERY:  Do not drive or operate machinery for the first twenty four hours after surgery.  Do not make any important decisions for twenty four hours after surgery or while taking narcotic pain medications or sedatives.  If you develop intractable nausea and vomiting or a severe headache please notify your doctor immediately.  FOLLOW-UP:  Please make an appointment with your surgeon as instructed. You do not need to follow up with anesthesia unless specifically instructed to do  so.  WOUND CARE INSTRUCTIONS (if applicable):  Keep a dry clean dressing on the anesthesia/puncture wound site if there is drainage.  Once the wound has quit draining you may leave it open to air.  Generally you should leave the bandage intact for twenty four hours unless there is drainage.  If the epidural site drains for more than 36-48 hours please call the anesthesia department.  QUESTIONS?:  Please feel free to call your physician or the hospital operator if you have any questions, and they will be happy to assist you.

## 2014-01-31 NOTE — Transfer of Care (Signed)
Immediate Anesthesia Transfer of Care Note  Patient: David Stein  Procedure(s) Performed: Procedure(s): CATARACT EXTRACTION PHACO AND INTRAOCULAR LENS PLACEMENT RIGHT EYE CDE=6.90 (Right)  Patient Location: Short Stay  Anesthesia Type:MAC  Level of Consciousness: awake  Airway & Oxygen Therapy: Patient Spontanous Breathing  Post-op Assessment: Report given to PACU RN  Post vital signs: Reviewed  Complications: No apparent anesthesia complications

## 2014-01-31 NOTE — Anesthesia Postprocedure Evaluation (Signed)
  Anesthesia Post-op Note  Patient: David Stein  Procedure(s) Performed: Procedure(s): CATARACT EXTRACTION PHACO AND INTRAOCULAR LENS PLACEMENT RIGHT EYE CDE=6.90 (Right)  Patient Location: Short Stay  Anesthesia Type:MAC  Level of Consciousness: awake, alert  and oriented  Airway and Oxygen Therapy: Patient Spontanous Breathing  Post-op Pain: none  Post-op Assessment: Post-op Vital signs reviewed, Patient's Cardiovascular Status Stable, Respiratory Function Stable, Patent Airway and No signs of Nausea or vomiting  Post-op Vital Signs: Reviewed and stable  Last Vitals:  Filed Vitals:   01/31/14 0855  BP: 104/70  Temp:   Resp: 23    Complications: No apparent anesthesia complications

## 2014-01-31 NOTE — H&P (Signed)
The patient was re examined and there is no change in the patients condition since the original H and P. 

## 2014-01-31 NOTE — Op Note (Signed)
Patient brought to the operating room and prepped and draped in the usual manner.  Lid speculum inserted in right eye.  Stab incision made at the twelve o'clock position.  Provisc instilled in the anterior chamber.   A 2.4 mm. Stab incision was made temporally.  An anterior capsulotomy was done with a bent 25 gauge needle.  The nucleus was hydrodissected.  The Phaco tip was inserted in the anterior chamber and the nucleus was emulsified.  CDE was 6.90.  The cortical material was then removed with the I and A tip.  Posterior capsule was the polished.  The anterior chamber was deepened with Provisc.  A 22.5 Alcon SN60WF IOL was then inserted in the capsular bag.  Provisc was then removed with the I and A tip.  The wound was then hydrated.  Patient sent to the Recovery Room in good condition with follow up in my office.  Preoperative Diagnosis:  Nuclear Cataract OD Postoperative Diagnosis:  Same Procedure name: Kelman Phacoemulsification OD with IOL

## 2014-01-31 NOTE — Anesthesia Preprocedure Evaluation (Signed)
Anesthesia Evaluation  Patient identified by MRN, date of birth, ID band Patient awake    Reviewed: Allergy & Precautions, H&P , NPO status , Patient's Chart, lab work & pertinent test results  Airway Mallampati: II  TM Distance: >3 FB     Dental  (+) Teeth Intact   Pulmonary shortness of breath and with exertion, COPD (severe) COPD inhaler and oxygen dependent, Current Smoker,  breath sounds clear to auscultation        Cardiovascular negative cardio ROS  Rhythm:Regular Rate:Normal     Neuro/Psych    GI/Hepatic negative GI ROS,   Endo/Other    Renal/GU      Musculoskeletal  (+) Arthritis -,   Abdominal   Peds  Hematology   Anesthesia Other Findings   Reproductive/Obstetrics                             Anesthesia Physical Anesthesia Plan  ASA: III  Anesthesia Plan: MAC   Post-op Pain Management:    Induction: Intravenous  Airway Management Planned: Nasal Cannula  Additional Equipment:   Intra-op Plan:   Post-operative Plan:   Informed Consent: I have reviewed the patients History and Physical, chart, labs and discussed the procedure including the risks, benefits and alternatives for the proposed anesthesia with the patient or authorized representative who has indicated his/her understanding and acceptance.     Plan Discussed with:   Anesthesia Plan Comments:         Anesthesia Quick Evaluation

## 2014-02-01 ENCOUNTER — Encounter (HOSPITAL_COMMUNITY): Payer: Self-pay | Admitting: Ophthalmology

## 2014-02-09 ENCOUNTER — Encounter (HOSPITAL_COMMUNITY): Payer: Self-pay

## 2014-02-09 ENCOUNTER — Encounter (HOSPITAL_COMMUNITY)
Admission: RE | Admit: 2014-02-09 | Discharge: 2014-02-09 | Disposition: A | Discharge status: Home / Self Care | Payer: Medicare Other | Source: Ambulatory Visit | Attending: Ophthalmology | Admitting: Ophthalmology

## 2014-02-13 MED ORDER — PHENYLEPHRINE HCL 2.5 % OP SOLN
OPHTHALMIC | Status: AC
  Filled 2014-02-13: qty 15

## 2014-02-13 MED ORDER — CYCLOPENTOLATE-PHENYLEPHRINE OP SOLN OPTIME - NO CHARGE
OPHTHALMIC | Status: AC
  Filled 2014-02-13: qty 2

## 2014-02-13 MED ORDER — KETOROLAC TROMETHAMINE 0.5 % OP SOLN
OPHTHALMIC | Status: AC
  Filled 2014-02-13: qty 5

## 2014-02-13 MED ORDER — TETRACAINE HCL 0.5 % OP SOLN
OPHTHALMIC | Status: AC
  Filled 2014-02-13: qty 2

## 2014-02-14 ENCOUNTER — Ambulatory Visit (HOSPITAL_COMMUNITY)
Admission: RE | Admit: 2014-02-14 | Discharge: 2014-02-14 | Disposition: A | Discharge status: Home / Self Care | Payer: Medicare Other | Source: Ambulatory Visit | Attending: Ophthalmology | Admitting: Ophthalmology

## 2014-02-14 ENCOUNTER — Encounter (HOSPITAL_COMMUNITY): Payer: Self-pay | Admitting: *Deleted

## 2014-02-14 ENCOUNTER — Encounter (HOSPITAL_COMMUNITY)
Admission: RE | Disposition: A | Discharge status: Home / Self Care | Payer: Self-pay | Source: Ambulatory Visit | Attending: Ophthalmology

## 2014-02-14 ENCOUNTER — Ambulatory Visit (HOSPITAL_COMMUNITY): Payer: Medicare Other | Admitting: Anesthesiology

## 2014-02-14 DIAGNOSIS — J449 Chronic obstructive pulmonary disease, unspecified: Secondary | ICD-10-CM | POA: Diagnosis not present

## 2014-02-14 DIAGNOSIS — H269 Unspecified cataract: Secondary | ICD-10-CM | POA: Insufficient documentation

## 2014-02-14 DIAGNOSIS — Z7951 Long term (current) use of inhaled steroids: Secondary | ICD-10-CM | POA: Insufficient documentation

## 2014-02-14 DIAGNOSIS — Z9981 Dependence on supplemental oxygen: Secondary | ICD-10-CM | POA: Diagnosis not present

## 2014-02-14 DIAGNOSIS — F17200 Nicotine dependence, unspecified, uncomplicated: Secondary | ICD-10-CM | POA: Diagnosis not present

## 2014-02-14 HISTORY — PX: CATARACT EXTRACTION W/PHACO: SHX586

## 2014-02-14 SURGERY — PHACOEMULSIFICATION, CATARACT, WITH IOL INSERTION
Anesthesia: Monitor Anesthesia Care | Site: Eye | Laterality: Left

## 2014-02-14 MED ORDER — LIDOCAINE HCL (PF) 1 % IJ SOLN
INTRAMUSCULAR | Status: AC
  Filled 2014-02-14: qty 2

## 2014-02-14 MED ORDER — KETOROLAC TROMETHAMINE 0.5 % OP SOLN
1.0000 [drp] | OPHTHALMIC | Status: AC
  Administered 2014-02-14 (×3): 1 [drp] via OPHTHALMIC

## 2014-02-14 MED ORDER — PROVISC 10 MG/ML IO SOLN
INTRAOCULAR | Status: DC | PRN
  Administered 2014-02-14: 0.85 mL via INTRAOCULAR

## 2014-02-14 MED ORDER — FENTANYL CITRATE 0.05 MG/ML IJ SOLN
INTRAMUSCULAR | Status: AC
  Filled 2014-02-14: qty 2

## 2014-02-14 MED ORDER — PHENYLEPHRINE HCL 2.5 % OP SOLN
1.0000 [drp] | OPHTHALMIC | Status: AC | PRN
  Administered 2014-02-14 (×3): 1 [drp] via OPHTHALMIC

## 2014-02-14 MED ORDER — TETRACAINE 0.5 % OP SOLN OPTIME - NO CHARGE
OPHTHALMIC | Status: DC | PRN
  Administered 2014-02-14: 1 [drp] via OPHTHALMIC

## 2014-02-14 MED ORDER — EPINEPHRINE HCL 1 MG/ML IJ SOLN
INTRAMUSCULAR | Status: AC
  Filled 2014-02-14: qty 1

## 2014-02-14 MED ORDER — MIDAZOLAM HCL 2 MG/2ML IJ SOLN
1.0000 mg | INTRAMUSCULAR | Status: DC | PRN
  Administered 2014-02-14: 2 mg via INTRAVENOUS

## 2014-02-14 MED ORDER — CYCLOPENTOLATE-PHENYLEPHRINE 0.2-1 % OP SOLN
1.0000 [drp] | OPHTHALMIC | Status: AC
  Administered 2014-02-14 (×3): 1 [drp] via OPHTHALMIC

## 2014-02-14 MED ORDER — TETRACAINE HCL 0.5 % OP SOLN
1.0000 [drp] | OPHTHALMIC | Status: AC
  Administered 2014-02-14 (×3): 1 [drp] via OPHTHALMIC

## 2014-02-14 MED ORDER — LACTATED RINGERS IV SOLN
INTRAVENOUS | Status: DC
  Administered 2014-02-14: 07:00:00 via INTRAVENOUS

## 2014-02-14 MED ORDER — FENTANYL CITRATE 0.05 MG/ML IJ SOLN
25.0000 ug | INTRAMUSCULAR | Status: AC
  Administered 2014-02-14 (×2): 25 ug via INTRAVENOUS

## 2014-02-14 MED ORDER — EPINEPHRINE HCL 1 MG/ML IJ SOLN
INTRAOCULAR | Status: DC | PRN
  Administered 2014-02-14: 08:00:00

## 2014-02-14 MED ORDER — MIDAZOLAM HCL 2 MG/2ML IJ SOLN
INTRAMUSCULAR | Status: AC
  Filled 2014-02-14: qty 2

## 2014-02-14 MED ORDER — BSS IO SOLN
INTRAOCULAR | Status: DC | PRN
  Administered 2014-02-14: 15 mL via INTRAOCULAR

## 2014-02-14 SURGICAL SUPPLY — 27 items
CAPSULAR TENSION RING-AMO (OPHTHALMIC RELATED) IMPLANT
CLOTH BEACON ORANGE TIMEOUT ST (SAFETY) ×2 IMPLANT
EYE SHIELD UNIVERSAL CLEAR (GAUZE/BANDAGES/DRESSINGS) ×2 IMPLANT
GLOVE BIO SURGEON STRL SZ 6.5 (GLOVE) IMPLANT
GLOVE BIO SURGEONS STRL SZ 6.5 (GLOVE)
GLOVE BIOGEL PI IND STRL 6.5 (GLOVE) IMPLANT
GLOVE BIOGEL PI INDICATOR 6.5 (GLOVE) ×2
GLOVE ECLIPSE 6.5 STRL STRAW (GLOVE) IMPLANT
GLOVE ECLIPSE 7.0 STRL STRAW (GLOVE) IMPLANT
GLOVE EXAM NITRILE LRG STRL (GLOVE) IMPLANT
GLOVE EXAM NITRILE MD LF STRL (GLOVE) ×2 IMPLANT
GLOVE SKINSENSE NS SZ6.5 (GLOVE)
GLOVE SKINSENSE STRL SZ6.5 (GLOVE) IMPLANT
HEALON 5 0.6 ML (INTRAOCULAR LENS) IMPLANT
KIT VITRECTOMY (OPHTHALMIC RELATED) IMPLANT
PAD ARMBOARD 7.5X6 YLW CONV (MISCELLANEOUS) ×2 IMPLANT
PROC W NO LENS (INTRAOCULAR LENS)
PROC W SPEC LENS (INTRAOCULAR LENS)
PROCESS W NO LENS (INTRAOCULAR LENS) IMPLANT
PROCESS W SPEC LENS (INTRAOCULAR LENS) IMPLANT
RETRACTOR IRIS SIGHTPATH (OPHTHALMIC RELATED) IMPLANT
RING MALYGIN (MISCELLANEOUS) IMPLANT
SIGHTPATH CAT PROC W REG LENS (Ophthalmic Related) ×3 IMPLANT
TAPE SURG TRANSPORE 1 IN (GAUZE/BANDAGES/DRESSINGS) IMPLANT
TAPE SURGICAL TRANSPORE 1 IN (GAUZE/BANDAGES/DRESSINGS) ×2
VISCOELASTIC ADDITIONAL (OPHTHALMIC RELATED) IMPLANT
WATER STERILE IRR 250ML POUR (IV SOLUTION) ×2 IMPLANT

## 2014-02-14 NOTE — Op Note (Signed)
Patient brought to the operating room and prepped and draped in the usual manner.  Lid speculum inserted in left eye.  Stab incision made at the twelve o'clock position.  Provisc instilled in the anterior chamber.   A 2.4 mm. Stab incision was made temporally.  An anterior capsulotomy was done with a bent 25 gauge needle.  The nucleus was hydrodissected.  The Phaco tip was inserted in the anterior chamber and the nucleus was emulsified.  CDE was 8.34.David Stein  The cortical material was then removed with the I and A tip.  Posterior capsule was the polished.  The anterior chamber was deepened with Provisc.  A 22.5 Alcon SN60WF IOL was then inserted in the capsular bag.  Provisc was then removed with the I and A tip.  The wound was then hydrated.  Patient sent to the Recovery Room in good condition with follow up in my office.  Preoperative Diagnosis:  Nuclear Cataract OS Postoperative Diagnosis:  Same Procedure name: Kelman Phacoemulsification OS with IOL

## 2014-02-14 NOTE — Anesthesia Postprocedure Evaluation (Signed)
  Anesthesia Post-op Note  Patient: David Stein  Procedure(s) Performed: Procedure(s): CATARACT EXTRACTION PHACO AND INTRAOCULAR LENS PLACEMENT; CDE:  8.34 (Left)  Patient Location: Short Stay  Anesthesia Type:MAC  Level of Consciousness: awake, alert , oriented and patient cooperative  Airway and Oxygen Therapy: Patient Spontanous Breathing  Post-op Pain: none  Post-op Assessment: Post-op Vital signs reviewed, Patient's Cardiovascular Status Stable, Respiratory Function Stable, Patent Airway and Pain level controlled  Post-op Vital Signs: Reviewed and stable  Last Vitals:  Filed Vitals:   02/14/14 0720  BP: 124/81  Pulse:   Temp:   Resp: 25    Complications: No apparent anesthesia complications

## 2014-02-14 NOTE — H&P (Signed)
The patient was re examined and there is no change in the patients condition since the original H and P. 

## 2014-02-14 NOTE — Discharge Instructions (Signed)
David Stein  02/14/2014           [facility] Instructions [place] [address]      1. Avoid closing eyes tightly. One often closes the eye tightly when laughing, talking, sneezing, coughing or if they feel irritated. At these times, you should be careful not to close your eyes tightly.  2. Instill eye drops as instructed. To instill drops in your eye, open it, look up and have someone gently pull the lower lid down and instill a couple of drops inside the lower lid.  3. Do not touch upper lid.  4. Take Advil or Tylenol for pain.  5. You may use either eye for near work, such as reading or sewing and you may watch television.  6. You may have your hair done at the beauty parlor at any time.  7. Wear dark glasses with or without your own glasses if you are in bright light.  8. Call our office at [phone] or [phone] if you have sharp pain in your eye or unusual symptoms.  9. Do not be concerned because vision in the operative eye is not good. It will not be good, no matter how successful the operation, until you get a special lens for it. Your old glasses will not be suited to the new eye that was operated on and you will not be ready for a new lens for about a month.  10. Follow up at the [facility] office.    I have received a copy of the above instructions and will follow them.      PATIENT INSTRUCTIONS POST-ANESTHESIA  IMMEDIATELY FOLLOWING SURGERY:  Do not drive or operate machinery for the first twenty four hours after surgery.  Do not make any important decisions for twenty four hours after surgery or while taking narcotic pain medications or sedatives.  If you develop intractable nausea and vomiting or a severe headache please notify your doctor immediately.  FOLLOW-UP:  Please make an appointment with your surgeon as instructed. You do not need to follow up with anesthesia unless specifically instructed to do  so.  WOUND CARE INSTRUCTIONS (if applicable):  Keep a dry clean dressing on the anesthesia/puncture wound site if there is drainage.  Once the wound has quit draining you may leave it open to air.  Generally you should leave the bandage intact for twenty four hours unless there is drainage.  If the epidural site drains for more than 36-48 hours please call the anesthesia department.  QUESTIONS?:  Please feel free to call your physician or the hospital operator if you have any questions, and they will be happy to assist you.

## 2014-02-14 NOTE — Anesthesia Preprocedure Evaluation (Signed)
Anesthesia Evaluation  Patient identified by MRN, date of birth, ID band Patient awake    Reviewed: Allergy & Precautions, H&P , NPO status , Patient's Chart, lab work & pertinent test results  Airway Mallampati: II  TM Distance: >3 FB     Dental  (+) Teeth Intact   Pulmonary shortness of breath and with exertion, COPD (severe) COPD inhaler and oxygen dependent, Current Smoker,  breath sounds clear to auscultation        Cardiovascular negative cardio ROS  Rhythm:Regular Rate:Normal     Neuro/Psych    GI/Hepatic negative GI ROS,   Endo/Other    Renal/GU      Musculoskeletal  (+) Arthritis -,   Abdominal   Peds  Hematology   Anesthesia Other Findings   Reproductive/Obstetrics                             Anesthesia Physical Anesthesia Plan  ASA: III  Anesthesia Plan: MAC   Post-op Pain Management:    Induction: Intravenous  Airway Management Planned: Nasal Cannula  Additional Equipment:   Intra-op Plan:   Post-operative Plan:   Informed Consent: I have reviewed the patients History and Physical, chart, labs and discussed the procedure including the risks, benefits and alternatives for the proposed anesthesia with the patient or authorized representative who has indicated his/her understanding and acceptance.     Plan Discussed with:   Anesthesia Plan Comments:         Anesthesia Quick Evaluation

## 2014-02-14 NOTE — Transfer of Care (Signed)
Immediate Anesthesia Transfer of Care Note  Patient: David Stein  Procedure(s) Performed: Procedure(s): CATARACT EXTRACTION PHACO AND INTRAOCULAR LENS PLACEMENT; CDE:  8.34 (Left)  Patient Location: Short Stay  Anesthesia Type:MAC  Level of Consciousness: awake, alert , oriented and patient cooperative  Airway & Oxygen Therapy: Patient Spontanous Breathing  Post-op Assessment: Report given to PACU RN  Post vital signs: Reviewed and stable  Complications: No apparent anesthesia complications

## 2014-02-15 ENCOUNTER — Encounter (HOSPITAL_COMMUNITY): Payer: Self-pay | Admitting: Ophthalmology

## 2014-03-24 DIAGNOSIS — J449 Chronic obstructive pulmonary disease, unspecified: Secondary | ICD-10-CM | POA: Diagnosis not present

## 2014-03-24 DIAGNOSIS — J439 Emphysema, unspecified: Secondary | ICD-10-CM | POA: Diagnosis not present

## 2014-03-28 DIAGNOSIS — Z961 Presence of intraocular lens: Secondary | ICD-10-CM | POA: Diagnosis not present

## 2014-04-06 DIAGNOSIS — J449 Chronic obstructive pulmonary disease, unspecified: Secondary | ICD-10-CM | POA: Diagnosis not present

## 2014-04-24 DIAGNOSIS — J449 Chronic obstructive pulmonary disease, unspecified: Secondary | ICD-10-CM | POA: Diagnosis not present

## 2014-04-24 DIAGNOSIS — J439 Emphysema, unspecified: Secondary | ICD-10-CM | POA: Diagnosis not present

## 2014-05-23 DIAGNOSIS — J449 Chronic obstructive pulmonary disease, unspecified: Secondary | ICD-10-CM | POA: Diagnosis not present

## 2014-05-23 DIAGNOSIS — J439 Emphysema, unspecified: Secondary | ICD-10-CM | POA: Diagnosis not present

## 2014-06-23 DIAGNOSIS — J449 Chronic obstructive pulmonary disease, unspecified: Secondary | ICD-10-CM | POA: Diagnosis not present

## 2014-06-23 DIAGNOSIS — J439 Emphysema, unspecified: Secondary | ICD-10-CM | POA: Diagnosis not present

## 2014-06-27 DIAGNOSIS — Z961 Presence of intraocular lens: Secondary | ICD-10-CM | POA: Diagnosis not present

## 2014-07-23 DIAGNOSIS — J439 Emphysema, unspecified: Secondary | ICD-10-CM | POA: Diagnosis not present

## 2014-07-23 DIAGNOSIS — J449 Chronic obstructive pulmonary disease, unspecified: Secondary | ICD-10-CM | POA: Diagnosis not present

## 2014-08-04 DIAGNOSIS — J449 Chronic obstructive pulmonary disease, unspecified: Secondary | ICD-10-CM | POA: Diagnosis not present

## 2014-08-23 DIAGNOSIS — J439 Emphysema, unspecified: Secondary | ICD-10-CM | POA: Diagnosis not present

## 2014-08-23 DIAGNOSIS — J449 Chronic obstructive pulmonary disease, unspecified: Secondary | ICD-10-CM | POA: Diagnosis not present

## 2014-09-22 DIAGNOSIS — J439 Emphysema, unspecified: Secondary | ICD-10-CM | POA: Diagnosis not present

## 2014-09-22 DIAGNOSIS — J449 Chronic obstructive pulmonary disease, unspecified: Secondary | ICD-10-CM | POA: Diagnosis not present

## 2014-10-23 DIAGNOSIS — J439 Emphysema, unspecified: Secondary | ICD-10-CM | POA: Diagnosis not present

## 2014-10-23 DIAGNOSIS — J449 Chronic obstructive pulmonary disease, unspecified: Secondary | ICD-10-CM | POA: Diagnosis not present

## 2014-11-23 DIAGNOSIS — J439 Emphysema, unspecified: Secondary | ICD-10-CM | POA: Diagnosis not present

## 2014-11-23 DIAGNOSIS — J449 Chronic obstructive pulmonary disease, unspecified: Secondary | ICD-10-CM | POA: Diagnosis not present

## 2014-12-01 ENCOUNTER — Other Ambulatory Visit (HOSPITAL_COMMUNITY): Payer: Self-pay | Admitting: Internal Medicine

## 2014-12-01 DIAGNOSIS — R05 Cough: Secondary | ICD-10-CM | POA: Diagnosis not present

## 2014-12-01 DIAGNOSIS — I10 Essential (primary) hypertension: Secondary | ICD-10-CM | POA: Diagnosis not present

## 2014-12-01 DIAGNOSIS — M1991 Primary osteoarthritis, unspecified site: Secondary | ICD-10-CM | POA: Diagnosis not present

## 2014-12-01 DIAGNOSIS — Z1389 Encounter for screening for other disorder: Secondary | ICD-10-CM

## 2014-12-01 DIAGNOSIS — M779 Enthesopathy, unspecified: Secondary | ICD-10-CM | POA: Diagnosis not present

## 2014-12-01 DIAGNOSIS — J449 Chronic obstructive pulmonary disease, unspecified: Secondary | ICD-10-CM | POA: Diagnosis not present

## 2014-12-01 DIAGNOSIS — M25531 Pain in right wrist: Secondary | ICD-10-CM

## 2014-12-01 DIAGNOSIS — Z0001 Encounter for general adult medical examination with abnormal findings: Secondary | ICD-10-CM | POA: Diagnosis not present

## 2014-12-01 DIAGNOSIS — Z6823 Body mass index (BMI) 23.0-23.9, adult: Secondary | ICD-10-CM | POA: Diagnosis not present

## 2014-12-04 DIAGNOSIS — J449 Chronic obstructive pulmonary disease, unspecified: Secondary | ICD-10-CM | POA: Diagnosis not present

## 2014-12-04 DIAGNOSIS — J209 Acute bronchitis, unspecified: Secondary | ICD-10-CM | POA: Diagnosis not present

## 2014-12-04 DIAGNOSIS — J9611 Chronic respiratory failure with hypoxia: Secondary | ICD-10-CM | POA: Diagnosis not present

## 2014-12-23 DIAGNOSIS — J439 Emphysema, unspecified: Secondary | ICD-10-CM | POA: Diagnosis not present

## 2014-12-23 DIAGNOSIS — J449 Chronic obstructive pulmonary disease, unspecified: Secondary | ICD-10-CM | POA: Diagnosis not present

## 2015-01-23 DIAGNOSIS — J449 Chronic obstructive pulmonary disease, unspecified: Secondary | ICD-10-CM | POA: Diagnosis not present

## 2015-01-23 DIAGNOSIS — J439 Emphysema, unspecified: Secondary | ICD-10-CM | POA: Diagnosis not present

## 2015-02-08 ENCOUNTER — Other Ambulatory Visit (HOSPITAL_COMMUNITY): Payer: Self-pay | Admitting: Internal Medicine

## 2015-02-08 ENCOUNTER — Ambulatory Visit (HOSPITAL_COMMUNITY)
Admission: RE | Admit: 2015-02-08 | Discharge: 2015-02-08 | Disposition: A | Discharge status: Home / Self Care | Payer: Medicare Other | Source: Ambulatory Visit | Attending: Internal Medicine | Admitting: Internal Medicine

## 2015-02-08 DIAGNOSIS — M79603 Pain in arm, unspecified: Secondary | ICD-10-CM | POA: Diagnosis not present

## 2015-02-08 DIAGNOSIS — M25539 Pain in unspecified wrist: Secondary | ICD-10-CM

## 2015-02-08 DIAGNOSIS — R0602 Shortness of breath: Secondary | ICD-10-CM | POA: Diagnosis not present

## 2015-02-08 DIAGNOSIS — J439 Emphysema, unspecified: Secondary | ICD-10-CM | POA: Insufficient documentation

## 2015-02-22 DIAGNOSIS — J439 Emphysema, unspecified: Secondary | ICD-10-CM | POA: Diagnosis not present

## 2015-02-22 DIAGNOSIS — J449 Chronic obstructive pulmonary disease, unspecified: Secondary | ICD-10-CM | POA: Diagnosis not present

## 2015-03-25 DIAGNOSIS — J439 Emphysema, unspecified: Secondary | ICD-10-CM | POA: Diagnosis not present

## 2015-03-25 DIAGNOSIS — J449 Chronic obstructive pulmonary disease, unspecified: Secondary | ICD-10-CM | POA: Diagnosis not present

## 2015-04-05 DIAGNOSIS — J449 Chronic obstructive pulmonary disease, unspecified: Secondary | ICD-10-CM | POA: Diagnosis not present

## 2015-04-05 DIAGNOSIS — J9611 Chronic respiratory failure with hypoxia: Secondary | ICD-10-CM | POA: Diagnosis not present

## 2015-04-25 DIAGNOSIS — J439 Emphysema, unspecified: Secondary | ICD-10-CM | POA: Diagnosis not present

## 2015-04-25 DIAGNOSIS — J449 Chronic obstructive pulmonary disease, unspecified: Secondary | ICD-10-CM | POA: Diagnosis not present

## 2015-05-23 DIAGNOSIS — J439 Emphysema, unspecified: Secondary | ICD-10-CM | POA: Diagnosis not present

## 2015-05-23 DIAGNOSIS — J449 Chronic obstructive pulmonary disease, unspecified: Secondary | ICD-10-CM | POA: Diagnosis not present

## 2015-06-23 DIAGNOSIS — J449 Chronic obstructive pulmonary disease, unspecified: Secondary | ICD-10-CM | POA: Diagnosis not present

## 2015-06-23 DIAGNOSIS — J439 Emphysema, unspecified: Secondary | ICD-10-CM | POA: Diagnosis not present

## 2015-07-23 DIAGNOSIS — J439 Emphysema, unspecified: Secondary | ICD-10-CM | POA: Diagnosis not present

## 2015-07-23 DIAGNOSIS — J449 Chronic obstructive pulmonary disease, unspecified: Secondary | ICD-10-CM | POA: Diagnosis not present

## 2015-08-02 DIAGNOSIS — J9611 Chronic respiratory failure with hypoxia: Secondary | ICD-10-CM | POA: Diagnosis not present

## 2015-08-02 DIAGNOSIS — J309 Allergic rhinitis, unspecified: Secondary | ICD-10-CM | POA: Diagnosis not present

## 2015-08-02 DIAGNOSIS — J449 Chronic obstructive pulmonary disease, unspecified: Secondary | ICD-10-CM | POA: Diagnosis not present

## 2015-08-06 ENCOUNTER — Encounter: Payer: Self-pay | Admitting: Internal Medicine

## 2015-08-23 DIAGNOSIS — J449 Chronic obstructive pulmonary disease, unspecified: Secondary | ICD-10-CM | POA: Diagnosis not present

## 2015-08-23 DIAGNOSIS — J439 Emphysema, unspecified: Secondary | ICD-10-CM | POA: Diagnosis not present

## 2015-09-22 DIAGNOSIS — J449 Chronic obstructive pulmonary disease, unspecified: Secondary | ICD-10-CM | POA: Diagnosis not present

## 2015-09-22 DIAGNOSIS — J439 Emphysema, unspecified: Secondary | ICD-10-CM | POA: Diagnosis not present

## 2015-10-23 DIAGNOSIS — J439 Emphysema, unspecified: Secondary | ICD-10-CM | POA: Diagnosis not present

## 2015-10-23 DIAGNOSIS — J449 Chronic obstructive pulmonary disease, unspecified: Secondary | ICD-10-CM | POA: Diagnosis not present

## 2015-11-23 DIAGNOSIS — J439 Emphysema, unspecified: Secondary | ICD-10-CM | POA: Diagnosis not present

## 2015-11-23 DIAGNOSIS — J449 Chronic obstructive pulmonary disease, unspecified: Secondary | ICD-10-CM | POA: Diagnosis not present

## 2015-12-08 ENCOUNTER — Emergency Department (HOSPITAL_COMMUNITY): Payer: Medicare Other

## 2015-12-08 ENCOUNTER — Inpatient Hospital Stay (HOSPITAL_COMMUNITY)
Admission: EM | Admit: 2015-12-08 | Discharge: 2015-12-14 | DRG: 308 | Disposition: A | Discharge status: Home / Self Care | Payer: Medicare Other | Attending: Family Medicine | Admitting: Family Medicine

## 2015-12-08 ENCOUNTER — Encounter (HOSPITAL_COMMUNITY): Payer: Self-pay | Admitting: Emergency Medicine

## 2015-12-08 DIAGNOSIS — N200 Calculus of kidney: Secondary | ICD-10-CM | POA: Insufficient documentation

## 2015-12-08 DIAGNOSIS — J441 Chronic obstructive pulmonary disease with (acute) exacerbation: Secondary | ICD-10-CM | POA: Diagnosis present

## 2015-12-08 DIAGNOSIS — G2581 Restless legs syndrome: Secondary | ICD-10-CM | POA: Diagnosis not present

## 2015-12-08 DIAGNOSIS — Z823 Family history of stroke: Secondary | ICD-10-CM

## 2015-12-08 DIAGNOSIS — J42 Unspecified chronic bronchitis: Secondary | ICD-10-CM | POA: Diagnosis not present

## 2015-12-08 DIAGNOSIS — J449 Chronic obstructive pulmonary disease, unspecified: Secondary | ICD-10-CM

## 2015-12-08 DIAGNOSIS — J440 Chronic obstructive pulmonary disease with acute lower respiratory infection: Secondary | ICD-10-CM | POA: Diagnosis not present

## 2015-12-08 DIAGNOSIS — Z9981 Dependence on supplemental oxygen: Secondary | ICD-10-CM

## 2015-12-08 DIAGNOSIS — I4892 Unspecified atrial flutter: Secondary | ICD-10-CM | POA: Diagnosis not present

## 2015-12-08 DIAGNOSIS — Z820 Family history of epilepsy and other diseases of the nervous system: Secondary | ICD-10-CM

## 2015-12-08 DIAGNOSIS — J9610 Chronic respiratory failure, unspecified whether with hypoxia or hypercapnia: Secondary | ICD-10-CM | POA: Diagnosis not present

## 2015-12-08 DIAGNOSIS — Z792 Long term (current) use of antibiotics: Secondary | ICD-10-CM

## 2015-12-08 DIAGNOSIS — M6281 Muscle weakness (generalized): Secondary | ICD-10-CM

## 2015-12-08 DIAGNOSIS — F17210 Nicotine dependence, cigarettes, uncomplicated: Secondary | ICD-10-CM | POA: Diagnosis present

## 2015-12-08 DIAGNOSIS — I4891 Unspecified atrial fibrillation: Secondary | ICD-10-CM | POA: Diagnosis not present

## 2015-12-08 DIAGNOSIS — Z87442 Personal history of urinary calculi: Secondary | ICD-10-CM | POA: Diagnosis not present

## 2015-12-08 DIAGNOSIS — M1990 Unspecified osteoarthritis, unspecified site: Secondary | ICD-10-CM | POA: Diagnosis not present

## 2015-12-08 DIAGNOSIS — J9611 Chronic respiratory failure with hypoxia: Secondary | ICD-10-CM | POA: Diagnosis not present

## 2015-12-08 DIAGNOSIS — R0602 Shortness of breath: Secondary | ICD-10-CM | POA: Diagnosis not present

## 2015-12-08 DIAGNOSIS — J189 Pneumonia, unspecified organism: Secondary | ICD-10-CM | POA: Diagnosis not present

## 2015-12-08 DIAGNOSIS — Z7952 Long term (current) use of systemic steroids: Secondary | ICD-10-CM | POA: Diagnosis not present

## 2015-12-08 DIAGNOSIS — R079 Chest pain, unspecified: Secondary | ICD-10-CM | POA: Diagnosis not present

## 2015-12-08 LAB — BASIC METABOLIC PANEL
ANION GAP: 7 (ref 5–15)
BUN: 16 mg/dL (ref 6–20)
CHLORIDE: 90 mmol/L — AB (ref 101–111)
CO2: 38 mmol/L — AB (ref 22–32)
Calcium: 9.2 mg/dL (ref 8.9–10.3)
Creatinine, Ser: 0.85 mg/dL (ref 0.61–1.24)
GFR calc Af Amer: >60 mL/min (ref >60)
GFR calc non Af Amer: >60 mL/min (ref >60)
GLUCOSE: 103 mg/dL — AB (ref 65–99)
Potassium: 3.9 mmol/L (ref 3.5–5.1)
Sodium: 135 mmol/L (ref 135–145)

## 2015-12-08 LAB — CBC
HEMATOCRIT: 42.1 % (ref 39.0–52.0)
HEMOGLOBIN: 13.1 g/dL (ref 13.0–17.0)
MCH: 31.4 pg (ref 26.0–34.0)
MCHC: 31.1 g/dL (ref 30.0–36.0)
MCV: 101 fL — AB (ref 78.0–100.0)
Platelets: 180 10*3/uL (ref 150–400)
RBC: 4.17 MIL/uL — ABNORMAL LOW (ref 4.22–5.81)
RDW: 13.5 % (ref 11.5–15.5)
WBC: 13.5 10*3/uL — ABNORMAL HIGH (ref 4.0–10.5)

## 2015-12-08 LAB — TROPONIN I: Troponin I: <0.03 ng/mL (ref <0.03)

## 2015-12-08 LAB — BRAIN NATRIURETIC PEPTIDE: B Natriuretic Peptide: 279 pg/mL — ABNORMAL HIGH (ref 0.0–100.0)

## 2015-12-08 LAB — PROTIME-INR
INR: 1.07
PROTHROMBIN TIME: 13.9 s (ref 11.4–15.2)

## 2015-12-08 LAB — T4, FREE: FREE T4: 1.08 ng/dL (ref 0.61–1.12)

## 2015-12-08 LAB — LACTIC ACID, PLASMA: LACTIC ACID, VENOUS: 1.1 mmol/L (ref 0.5–1.9)

## 2015-12-08 LAB — TSH: TSH: 1.397 u[IU]/mL (ref 0.350–4.500)

## 2015-12-08 LAB — D-DIMER, QUANTITATIVE: D-Dimer, Quant: 0.44 ug/mL-FEU (ref 0.00–0.50)

## 2015-12-08 MED ORDER — LEVOFLOXACIN 750 MG PO TABS
750.0000 mg | ORAL_TABLET | ORAL | Status: AC
  Administered 2015-12-09 – 2015-12-13 (×5): 750 mg via ORAL
  Filled 2015-12-08 (×5): qty 1

## 2015-12-08 MED ORDER — APIXABAN 5 MG PO TABS
5.0000 mg | ORAL_TABLET | Freq: Two times a day (BID) | ORAL | Status: DC
  Administered 2015-12-08 – 2015-12-14 (×12): 5 mg via ORAL
  Filled 2015-12-08 (×12): qty 1

## 2015-12-08 MED ORDER — DEXTROSE 5 % IV SOLN
1.0000 g | INTRAVENOUS | Status: DC
Start: 2015-12-08 — End: 2015-12-08
  Administered 2015-12-08: 1 g via INTRAVENOUS
  Filled 2015-12-08 (×2): qty 10

## 2015-12-08 MED ORDER — IBUPROFEN 400 MG PO TABS: 400.0000 mg | ORAL_TABLET | Freq: Four times a day (QID) | ORAL | Status: DC | PRN

## 2015-12-08 MED ORDER — ASPIRIN 81 MG PO CHEW
324.0000 mg | CHEWABLE_TABLET | Freq: Once | ORAL | Status: AC
  Administered 2015-12-08: 324 mg via ORAL
  Filled 2015-12-08: qty 4

## 2015-12-08 MED ORDER — ROPINIROLE HCL 0.25 MG PO TABS
0.2500 mg | ORAL_TABLET | Freq: Every day | ORAL | Status: DC
  Administered 2015-12-08 – 2015-12-13 (×6): 0.25 mg via ORAL
  Filled 2015-12-08 (×9): qty 1

## 2015-12-08 MED ORDER — PREDNISONE 10 MG PO TABS
10.0000 mg | ORAL_TABLET | Freq: Every day | ORAL | Status: DC
  Administered 2015-12-09: 10 mg via ORAL
  Filled 2015-12-08: qty 1

## 2015-12-08 MED ORDER — TIOTROPIUM BROMIDE MONOHYDRATE 18 MCG IN CAPS
18.0000 ug | ORAL_CAPSULE | Freq: Every day | RESPIRATORY_TRACT | Status: DC
  Filled 2015-12-08: qty 5

## 2015-12-08 MED ORDER — ROPINIROLE HCL 0.25 MG PO TABS
ORAL_TABLET | ORAL | Status: AC
  Filled 2015-12-08: qty 1

## 2015-12-08 MED ORDER — MOMETASONE FURO-FORMOTEROL FUM 200-5 MCG/ACT IN AERO
2.0000 | INHALATION_SPRAY | Freq: Two times a day (BID) | RESPIRATORY_TRACT | Status: DC
  Filled 2015-12-08: qty 8.8

## 2015-12-08 MED ORDER — DILTIAZEM HCL 100 MG IV SOLR
5.0000 mg/h | INTRAVENOUS | Status: DC
  Administered 2015-12-08: 10 mg/h via INTRAVENOUS
  Administered 2015-12-08: 5 mg/h via INTRAVENOUS
  Administered 2015-12-08: 15 mg/h via INTRAVENOUS
  Administered 2015-12-09: 11 mg/h via INTRAVENOUS
  Administered 2015-12-09 – 2015-12-10 (×3): 9 mg/h via INTRAVENOUS
  Filled 2015-12-08 (×4): qty 100

## 2015-12-08 MED ORDER — DILTIAZEM LOAD VIA INFUSION
10.0000 mg | Freq: Once | INTRAVENOUS | Status: AC
  Administered 2015-12-08: 10 mg via INTRAVENOUS
  Filled 2015-12-08: qty 10

## 2015-12-08 MED ORDER — DILTIAZEM HCL 100 MG IV SOLR
INTRAVENOUS | Status: AC
  Filled 2015-12-08: qty 100

## 2015-12-08 MED ORDER — ALBUTEROL SULFATE (2.5 MG/3ML) 0.083% IN NEBU: 2.5000 mg | INHALATION_SOLUTION | Freq: Four times a day (QID) | RESPIRATORY_TRACT | Status: DC | PRN

## 2015-12-08 MED ORDER — SODIUM CHLORIDE 0.9 % IV BOLUS (SEPSIS)
1000.0000 mL | Freq: Once | INTRAVENOUS | Status: AC
  Administered 2015-12-08: 1000 mL via INTRAVENOUS

## 2015-12-08 MED ORDER — DEXTROSE 5 % IV SOLN
500.0000 mg | INTRAVENOUS | Status: DC
  Administered 2015-12-08: 500 mg via INTRAVENOUS
  Filled 2015-12-08 (×2): qty 500

## 2015-12-08 NOTE — H&P (Signed)
History and Physical  David Stein [id number] DOB: 06-14-1945 DOA: 12/08/2015  Referring physician: Dr Sherry Ruffing, ED physician PCP: Leonides Grills, MD  Outpatient Specialists: none  Chief Complaint: shortness of breath  HPI: David Stein is a 70 y.o. male with a history of chronic respiratory failure, oxygen dependence, steroid dependence COPD, arthritis, restless leg syndrome. Patient seen with one-week history of persistent shortness of breath, worse with activity and improved with rest. Patient was placed on Bactrim by his PCP due to the respiratory symptoms as he frequently gets respiratory infections. After 3-4 days of no improvement, the patient presented here for evaluation. He denies increase in cough, sputum production, but does admit to fever couple of days ago with night sweats.   Review of Systems:   Pt denies any fevers, chills, nausea, vomiting, diarrhea, constipation, abdominal pain, orthopnea, cough, wheezing, palpitations, headache, vision changes, lightheadedness, dizziness, melena, rectal bleeding.  Review of systems are otherwise negative  Past Medical History:  Diagnosis Date  . Arthritis   . COPD (chronic obstructive pulmonary disease) ([place])   . Kidney stones   . 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 Restless leg syndrome   . Shortness of breath dyspnea    Past Surgical History:  Procedure Laterality Date  . CATARACT EXTRACTION W/PHACO Right 01/31/2014   Procedure: CATARACT EXTRACTION PHACO AND INTRAOCULAR LENS PLACEMENT RIGHT EYE CDE=6.90;  Surgeon: Elta Guadeloupe T. Gershon Crane, MD;  Location: [facility];  Service: Ophthalmology;  Laterality: Right;  . CATARACT EXTRACTION W/PHACO Left 02/14/2014   Procedure: CATARACT EXTRACTION PHACO AND INTRAOCULAR LENS PLACEMENT; CDE:  8.34;  Surgeon: Elta Guadeloupe T. Gershon Crane, MD;  Location: [facility];  Service: Ophthalmology;  Laterality: Left;  . KNEE SURGERY Left    Social History:  reports that he has been smoking Cigarettes.  He has a 13.75 pack-year  smoking history. He has never used smokeless tobacco. He reports that he does not drink alcohol or use drugs. Patient lives at [place]  . Codeine Other (See Comments)    headache    Family History  Problem Relation Age of Onset  . Rheumatic fever Father   . Alzheimer's disease Mother   . Stroke Sister       Prior to Admission medications   Medication Sig Start Date End Date Taking? Authorizing Provider  Aclidinium Bromide (TUDORZA PRESSAIR) 400 MCG/ACT AEPB Inhale 1 puff into the lungs daily.    Historical Provider, MD  albuterol (PROVENTIL) (2.5 MG/3ML) 0.083% nebulizer solution Take 2.5 mg by nebulization every 6 (six) hours as needed for wheezing or shortness of breath.     Historical Provider, MD  budesonide-formoterol (SYMBICORT) 160-4.5 MCG/ACT inhaler Inhale 2 puffs into the lungs 2 (two) times daily.    Historical Provider, MD  ibuprofen (ADVIL,MOTRIN) 200 MG tablet Take 400 mg by mouth every 6 (six) hours as needed for moderate pain.    Historical Provider, MD  Multiple Vitamin (MULTIVITAMIN WITH MINERALS) TABS tablet Take 1 tablet by mouth daily.    Historical Provider, MD  predniSONE (DELTASONE) 5 MG tablet Take 10 mg by mouth daily as needed (difficulty breathing). Pt unsure of the dosage    Historical Provider, MD    Physical Exam: BP 107/87   Pulse (!) 154   Temp 98.2 F (36.8 C) (Oral)   Resp 19   Ht 6\' 2"  (1.88 m)   Wt 81.6 kg (180 lb)   SpO2 97%   BMI 23.11 kg/m   General: Older  Caucasian gentleman. Awake and alert and oriented x3. No acute cardiopulmonary distress.  HEENT: Normocephalic atraumatic.  Right and left ears normal in appearance.  Pupils equal, round, reactive to light. Extraocular muscles are intact. Sclerae anicteric and noninjected.  Moist mucosal membranes. No mucosal lesions.  Neck: Neck supple without lymphadenopathy. No carotid bruits. No masses palpated.  Cardiovascular: Tachycardic rate with normal S1-S2  sounds. No murmurs, rubs, gallops auscultated. No JVD.  Respiratory: Diminished breath sounds throughout. Mild wheezes throughout. No rales. Abdomen: Soft, nontender, nondistended. Active bowel sounds. No masses or hepatosplenomegaly  Skin: No rashes, lesions, or ulcerations.  Dry, warm to touch. 2+ dorsalis pedis and radial pulses. Musculoskeletal: No calf or leg pain. All major joints not erythematous nontender.  No upper or lower joint deformation.  Good ROM.  No contractures  Psychiatric: Intact judgment and insight. Pleasant and cooperative. Neurologic: No focal neurological deficits. Strength is 5/5 and symmetric in upper and lower extremities.  Cranial nerves II through XII are grossly intact.           Labs on Admission: I have personally reviewed following labs and imaging studies  CBC:  Recent Labs Lab 12/08/15 1436  WBC 13.5*  HGB 13.1  HCT 42.1  MCV 101.0*  PLT 99991111   Basic Metabolic Panel:  Recent Labs Lab 12/08/15 1436  NA 135  K 3.9  CL 90*  CO2 38*  GLUCOSE 103*  BUN 16  CREATININE 0.85  CALCIUM 9.2   GFR: Estimated Creatinine Clearance: 93.3 mL/min (by C-G formula based on SCr of 0.85 mg/dL). Liver Function Tests: No results for input(s): AST, ALT, ALKPHOS, BILITOT, PROT, ALBUMIN in the last 168 hours. No results for input(s): LIPASE, AMYLASE in the last 168 hours. No results for input(s): AMMONIA in the last 168 hours. Coagulation Profile: No results for input(s): INR, PROTIME in the last 168 hours. Cardiac Enzymes:  Recent Labs Lab 12/08/15 1436  TROPONINI <0.03   BNP (last 3 results) No results for input(s): PROBNP in the last 8760 hours. HbA1C: No results for input(s): HGBA1C in the last 72 hours. CBG: No results for input(s): GLUCAP in the last 168 hours. Lipid Profile: No results for input(s): CHOL, HDL, LDLCALC, TRIG, CHOLHDL, LDLDIRECT in the last 72 hours. Thyroid Function Tests: No results for input(s): TSH, T4TOTAL, FREET4,  T3FREE, THYROIDAB in the last 72 hours. Anemia Panel: No results for input(s): VITAMINB12, FOLATE, FERRITIN, TIBC, IRON, RETICCTPCT in the last 72 hours. Urine analysis: No results found for: COLORURINE, APPEARANCEUR, LABSPEC, PHURINE, GLUCOSEU, HGBUR, BILIRUBINUR, KETONESUR, PROTEINUR, UROBILINOGEN, NITRITE, LEUKOCYTESUR Sepsis Labs: @LABRCNTIP (procalcitonin:4,lacticidven:4) )No results found for this or any previous visit (from the past 240 hour(s)).   Radiological Exams on Admission: Dg Chest Port 1 View  Result Date: 12/08/2015 CLINICAL DATA:  Chest pain and tachycardia. Worsening shortness of breath over the past 5 days with bilateral peripheral edema. Oxygen dependent. COPD. EXAM: PORTABLE CHEST 1 VIEW COMPARISON:  02/08/2015 FINDINGS: Lungs are hyperinflated with minimal focal opacification in the left base just above the hemidiaphragm which may be due to atelectasis or infection. Evidence of emphysematous disease. Few small stable calcified granulomas. Cardiac silhouette is within normal. There is calcified plaque over the thoracic aorta. Remainder the exam is unchanged. IMPRESSION: Minimal focal opacification over the left base which may be due to atelectasis or infection. Aortic atherosclerosis. COPD. Electronically Signed   By: Marin Olp M.D.   On: 12/08/2015 15:11    EKG: Independently reviewed. White QRS tachycardia with right bundle branch  block. Rhythm strips show atrial flutter at 2-3:1.  Assessment/Plan: Principal Problem:   Atrial flutter with rapid ventricular response (HCC) Active Problems:   CAP (community acquired pneumonia)   Chronic respiratory failure ([place])    This patient was discussed with the ED physician, including pertinent vitals, physical exam findings, labs, and imaging.  We also discussed care given by the ED provider.  #1 atrial flutter  Admit to stepdown  Continue Cardizem drip - resect this is improving  Start anticoagulation with  Eliquis  TSH, T4, INR now  Echocardiogram in the morning  We'll need cardiology consult if patient doesn't convert with Cardizem #2 community acquired pneumonia  Levofloxacin  Sputum cultures  Strep urine antigen  Unable to do percussive tone and secondary to prolonged antibiotic use and prolonged steroid use. #3 chronic respiratory failure  Continue nasal cannula #4 COPD  Continue home inhalers  DVT prophylaxis: Full dose and a coagulation with Eliquis Consultants: None Code Status: Full code Family Communication: Wife, son and room  Disposition Plan: Return to home following admission   Truett Mainland, DO [facility] Pager [phone]  If 7PM-7AM, please contact night-coverage [email or web address] Password [email or web address]

## 2015-12-08 NOTE — ED Triage Notes (Signed)
PT c/o center chest pain with worsening in SOB x5days and increased bilateral peripheral edema. PT states he wears 2.5L per n/c of oxygen at all times.

## 2015-12-08 NOTE — ED Notes (Signed)
Verified that rocephin and Zithromax are compatible with cardizem

## 2015-12-08 NOTE — ED Notes (Signed)
MD at bedside. 

## 2015-12-08 NOTE — ED Provider Notes (Addendum)
[facility] DEPT Provider Note   CSN: [id number] Arrival date & time: 12/08/15  1411     History   Chief Complaint Chief Complaint  Patient presents with  . Chest Pain    HPI David Stein is a 70 y.o. male With a past medical history significant for COPD requiring 2.5 L nasal cannula At baseline, And reported mild CHF Who presents with chest pain, shortness of breath, tachycardia, palpitations, and fatigue. Patient reports that he has been "not feeling right" all week. He says that he has had some shortness of breath and chest congestion for the last week prompting him to be given Bactrim as an antibiotic for his congestion. He reports that his symptoms have not improved and today, he had sudden onset of chest pain and shortness of breath palpitations.  Patient reports chest pain is pressure like, and his central chest, nonradiating, associated with nausea and vomiting, associated with diaphoresis, and causing worsen fatigue. Patient denies taking any other medications and denies leg pain But does say he has had mild bilateral leg swelling. He denies any history of DVT were. He denies any fevers.    The history is provided by the patient, a relative and medical records. No language interpreter was used.  Palpitations   This is a new problem. The current episode started 6 to 12 hours ago. The problem occurs constantly. The problem has not changed since onset.Associated symptoms include diaphoresis, chest pain, chest pressure, irregular heartbeat, nausea, vomiting, cough and shortness of breath. Pertinent negatives include no fever, no numbness, no near-syncope, no abdominal pain, no back pain, no leg pain, no lower extremity edema, no dizziness and no weakness. He has tried nothing for the symptoms. The treatment provided no relief. Risk factors: COPD, CHF.    Past Medical History:  Diagnosis Date  . Arthritis   . COPD (chronic obstructive pulmonary disease) ([place])   . Kidney stones     . 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 Restless leg syndrome   . Shortness of breath dyspnea     There are no active problems to display for this patient.   Past Surgical History:  Procedure Laterality Date  . CATARACT EXTRACTION W/PHACO Right 01/31/2014   Procedure: CATARACT EXTRACTION PHACO AND INTRAOCULAR LENS PLACEMENT RIGHT EYE CDE=6.90;  Surgeon: Elta Guadeloupe T. Gershon Crane, MD;  Location: [facility];  Service: Ophthalmology;  Laterality: Right;  . CATARACT EXTRACTION W/PHACO Left 02/14/2014   Procedure: CATARACT EXTRACTION PHACO AND INTRAOCULAR LENS PLACEMENT; CDE:  8.34;  Surgeon: Elta Guadeloupe T. Gershon Crane, MD;  Location: [facility];  Service: Ophthalmology;  Laterality: Left;  . KNEE SURGERY Left        Home Medications    Prior to Admission medications   Medication Sig Start Date End Date Taking? Authorizing Provider  Aclidinium Bromide (TUDORZA PRESSAIR) 400 MCG/ACT AEPB Inhale 1 puff into the lungs daily.    Historical Provider, MD  albuterol (PROVENTIL) (2.5 MG/3ML) 0.083% nebulizer solution Take 2.5 mg by nebulization every 6 (six) hours as needed for wheezing or shortness of breath.     Historical Provider, MD  budesonide-formoterol (SYMBICORT) 160-4.5 MCG/ACT inhaler Inhale 2 puffs into the lungs 2 (two) times daily.    Historical Provider, MD  ibuprofen (ADVIL,MOTRIN) 200 MG tablet Take 400 mg by mouth every 6 (six) hours as needed for moderate pain.    Historical Provider, MD  Multiple Vitamin (MULTIVITAMIN WITH MINERALS) TABS tablet Take 1 tablet by mouth daily.    Historical Provider, MD  predniSONE (DELTASONE) 5 MG tablet Take 10 mg by mouth daily as needed (difficulty breathing). Pt unsure of the dosage    Historical Provider, MD    Family History History reviewed. No pertinent family history.  Social History Social History  Substance Use Topics  . Smoking status: Light Tobacco Smoker    Packs/day: 0.25    Years: 55.00    Types: Cigarettes  . Smokeless tobacco: Never Used  . Alcohol use  No     Allergies   Codeine   Review of Systems Review of Systems  Constitutional: Positive for diaphoresis. Negative for appetite change, chills and fever.  HENT: Positive for congestion. Negative for rhinorrhea.   Eyes: Negative for visual disturbance.  Respiratory: Positive for cough and shortness of breath. Negative for chest tightness, wheezing and stridor.   Cardiovascular: Positive for chest pain, palpitations and leg swelling. Negative for near-syncope.  Gastrointestinal: Positive for nausea and vomiting. Negative for abdominal pain, constipation and diarrhea.  Genitourinary: Negative for dysuria.  Musculoskeletal: Negative for back pain, neck pain and neck stiffness.  Skin: Negative for rash and wound.  Neurological: Negative for dizziness, weakness and numbness.  Psychiatric/Behavioral: Negative for agitation and confusion.  All other systems reviewed and are negative.    Physical Exam Updated Vital Signs BP 119/79 (BP Location: Left Arm)   Pulse (!) 154   Temp 98.2 F (36.8 C) (Oral)   Resp 26   Ht 6\' 2"  (1.88 m)   Wt 180 lb (81.6 kg)   SpO2 95%   BMI 23.11 kg/m   Physical Exam  Constitutional: He is oriented to person, place, and time. He appears well-developed and well-nourished. He appears distressed.  HENT:  Head: Normocephalic and atraumatic.  Mouth/Throat: Oropharynx is clear and moist. No oropharyngeal exudate.  Eyes: Conjunctivae and EOM are normal. Pupils are equal, round, and reactive to light.  Neck: Normal range of motion. Neck supple.  Cardiovascular: Intact distal pulses.  An irregular rhythm present. Tachycardia present.   No murmur heard. Pulmonary/Chest: Effort normal. No stridor. No respiratory distress. He has no wheezes. He exhibits no tenderness.  Abdominal: Soft. There is no tenderness.  Musculoskeletal: He exhibits no edema or tenderness.  Neurological: He is alert and oriented to person, place, and time. He exhibits normal muscle  tone.  Skin: Skin is warm. Capillary refill takes less than 2 seconds. He is diaphoretic. No pallor.  Psychiatric: He has a normal mood and affect.  Nursing note and vitals reviewed.    ED Treatments / Results  Labs (all labs ordered are listed, but only abnormal results are displayed) Labs Reviewed  BASIC METABOLIC PANEL - Abnormal; Notable for the following:       Result Value   Chloride 90 (*)    CO2 38 (*)    Glucose, Bld 103 (*)    All other components within normal limits  CBC - Abnormal; Notable for the following:    WBC 13.5 (*)    RBC 4.17 (*)    MCV 101.0 (*)    All other components within normal limits  BRAIN NATRIURETIC PEPTIDE - Abnormal; Notable for the following:    B Natriuretic Peptide 279.0 (*)    All other components within normal limits  MRSA PCR SCREENING  CULTURE, EXPECTORATED SPUTUM-ASSESSMENT  GRAM STAIN  TROPONIN I  D-DIMER, QUANTITATIVE (NOT AT [facility])  LACTIC ACID, PLASMA  TSH  PROTIME-INR  STREP PNEUMONIAE URINARY ANTIGEN  CBC  BASIC METABOLIC PANEL  T4, FREE  EKG  EKG Interpretation  Date/Time:  Saturday December 08 2015 14:16:42 EDT Ventricular Rate:  154 PR Interval:    QRS Duration: 130 QT Interval:  298 QTC Calculation: 477 R Axis:   104 Text Interpretation:  Wide QRS tachycardia Right bundle branch block T wave abnormality, consider inferior ischemia Abnormal ECG Confirmed by Sherry Ruffing MD, CHRISTOPHER [phone]) on 12/08/2015 2:52:18 PM       Radiology Dg Chest Port 1 View  Result Date: 12/08/2015 CLINICAL DATA:  Chest pain and tachycardia. Worsening shortness of breath over the past 5 days with bilateral peripheral edema. Oxygen dependent. COPD. EXAM: PORTABLE CHEST 1 VIEW COMPARISON:  02/08/2015 FINDINGS: Lungs are hyperinflated with minimal focal opacification in the left base just above the hemidiaphragm which may be due to atelectasis or infection. Evidence of emphysematous disease. Few small stable calcified granulomas.  Cardiac silhouette is within normal. There is calcified plaque over the thoracic aorta. Remainder the exam is unchanged. IMPRESSION: Minimal focal opacification over the left base which may be due to atelectasis or infection. Aortic atherosclerosis. COPD. Electronically Signed   By: Marin Olp M.D.   On: 12/08/2015 15:11    Procedures Procedures (including critical care time)  CRITICAL CARE Performed by: Gwenyth Allegra Tegeler Total critical care time: 30 minutes Critical care time was exclusive of separately billable procedures and treating other patients. Critical care was necessary to treat or prevent imminent or life-threatening deterioration. Critical care was time spent personally by me on the following activities: development of treatment plan with patient and/or surrogate as well as nursing, discussions with consultants, evaluation of patient's response to treatment, examination of patient, obtaining history from patient or surrogate, ordering and performing treatments and interventions, ordering and review of laboratory studies, ordering and review of radiographic studies, pulse oximetry and re-evaluation of patient's condition.     Medications Ordered in ED Medications  diltiazem (CARDIZEM) 1 mg/mL load via infusion 10 mg (10 mg Intravenous Bolus from Bag 12/08/15 1454)    And  diltiazem (CARDIZEM) 100 mg in dextrose 5 % 100 mL (1 mg/mL) infusion (12 mg/hr Intravenous Rate/Dose Change 12/08/15 1855)  cefTRIAXone (ROCEPHIN) 1 g in dextrose 5 % 50 mL IVPB (0 g Intravenous Stopped 12/08/15 1648)  predniSONE (DELTASONE) tablet 10 mg (not administered)  albuterol (PROVENTIL) (2.5 MG/3ML) 0.083% nebulizer solution 2.5 mg (not administered)  mometasone-formoterol (DULERA) 200-5 MCG/ACT inhaler 2 puff (not administered)  tiotropium (SPIRIVA) inhalation capsule 18 mcg (not administered)  levofloxacin (LEVAQUIN) tablet 750 mg (not administered)  apixaban (ELIQUIS) tablet 5 mg (not  administered)  aspirin chewable tablet 324 mg (324 mg Oral Given 12/08/15 1454)  sodium chloride 0.9 % bolus 1,000 mL (1,000 mLs Intravenous Transfusing/Transfer 12/08/15 1648)     Initial Impression / Assessment and Plan / ED Course  I have reviewed the triage vital signs and the nursing notes.  Pertinent labs & imaging results that were available during my care of the patient were reviewed by me and considered in my medical decision making (see chart for details).  Clinical Course  Value Comment By Time  Chloride: (!) 90 (Reviewed) Gwenyth Allegra Tegeler, MD 09/16 2023    Waynard Edwards Boehnke is a 70 y.o. male With a past medical history significant for COPD requiring 2.5 L nasal cannula At baseline, And reported mild CHF Who presents with chest pain, shortness of breath, tachycardia, palpitations, and fatigue. In triage, patient found to be diaphoretic, tachycardic in the 150s, and ill appearing. Initial EKG showed tachycardia with concern  for a Flutter with RVR.   Patient was quickly taken to exam room where he was evaluated. Vagal maneuver attempted with mild pause appreciated on telemetry revealing atrial flutter morphology. Given this, diltiazem bolus And a drip was started. Laboratory testing was performed including troponin, BNP, CBC, and BNP. Lactic acid was normal, BNP slightly elevated, CBC showed mild leukocytosis, ND dimer was negative. Chest x-Quentavious showed concern for possible pneumonia.  Given chest x-Georg findings, leukocyte hoses, and patient currently being on antibiotics for chest congestion, patient given antibiotics for community acquired pneumonia. Patient's heart rate improved on diltiazem.  With new diagnosis of atrial flutter with RVR and concern for possible inciting pneumonia, patient admitted to hospitalist service for further management.   Final Clinical Impressions(s) / ED Diagnoses   Final diagnoses:  Atrial flutter with rapid ventricular response ([place])  Community  acquired pneumonia    New Prescriptions Current Discharge Medication List     Clinical Impression: 1. Atrial flutter with rapid ventricular response ([place])   2. Chronic obstructive pulmonary disease, unspecified COPD type ([place])   3. Arthritis   4. Restless leg syndrome   5. Kidney stones   6. Community acquired pneumonia     Disposition: Admit  Condition: Stable    Courtney Paris, MD 12/08/15 2025    Gwenyth Allegra Tegeler, MD 12/09/15 [phone]

## 2015-12-09 ENCOUNTER — Inpatient Hospital Stay (HOSPITAL_COMMUNITY): Payer: Medicare Other

## 2015-12-09 DIAGNOSIS — I4892 Unspecified atrial flutter: Secondary | ICD-10-CM

## 2015-12-09 LAB — CBC
HCT: 36.1 % — ABNORMAL LOW (ref 39.0–52.0)
HEMOGLOBIN: 11.1 g/dL — AB (ref 13.0–17.0)
MCH: 31.6 pg (ref 26.0–34.0)
MCHC: 30.7 g/dL (ref 30.0–36.0)
MCV: 102.8 fL — ABNORMAL HIGH (ref 78.0–100.0)
Platelets: 177 10*3/uL (ref 150–400)
RBC: 3.51 MIL/uL — AB (ref 4.22–5.81)
RDW: 13.3 % (ref 11.5–15.5)
WBC: 11.4 10*3/uL — ABNORMAL HIGH (ref 4.0–10.5)

## 2015-12-09 LAB — ECHOCARDIOGRAM COMPLETE
HEIGHTINCHES: 74 in
WEIGHTICAEL: 3065.28 [oz_av]

## 2015-12-09 LAB — BASIC METABOLIC PANEL
ANION GAP: 1 — AB (ref 5–15)
BUN: 10 mg/dL (ref 6–20)
CHLORIDE: 96 mmol/L — AB (ref 101–111)
CO2: 38 mmol/L — AB (ref 22–32)
Calcium: 8 mg/dL — ABNORMAL LOW (ref 8.9–10.3)
Creatinine, Ser: 0.73 mg/dL (ref 0.61–1.24)
GFR calc non Af Amer: >60 mL/min (ref >60)
Glucose, Bld: 96 mg/dL (ref 65–99)
POTASSIUM: 4.2 mmol/L (ref 3.5–5.1)
SODIUM: 135 mmol/L (ref 135–145)

## 2015-12-09 LAB — MRSA PCR SCREENING: MRSA BY PCR: NEGATIVE

## 2015-12-09 MED ORDER — LEVALBUTEROL HCL 0.63 MG/3ML IN NEBU: 0.6300 mg | INHALATION_SOLUTION | Freq: Four times a day (QID) | RESPIRATORY_TRACT | Status: DC | PRN

## 2015-12-09 MED ORDER — METHYLPREDNISOLONE SODIUM SUCC 125 MG IJ SOLR
60.0000 mg | Freq: Two times a day (BID) | INTRAMUSCULAR | Status: DC
  Administered 2015-12-09 – 2015-12-10 (×4): 60 mg via INTRAVENOUS
  Filled 2015-12-09 (×4): qty 2

## 2015-12-09 MED ORDER — TIOTROPIUM BROMIDE MONOHYDRATE 18 MCG IN CAPS
18.0000 ug | ORAL_CAPSULE | Freq: Every day | RESPIRATORY_TRACT | Status: DC
  Filled 2015-12-09: qty 5

## 2015-12-09 MED ORDER — IPRATROPIUM BROMIDE 0.02 % IN SOLN
0.5000 mg | Freq: Four times a day (QID) | RESPIRATORY_TRACT | Status: DC
  Filled 2015-12-09: qty 2.5

## 2015-12-09 MED ORDER — GUAIFENESIN ER 600 MG PO TB12
600.0000 mg | ORAL_TABLET | Freq: Two times a day (BID) | ORAL | Status: DC
  Administered 2015-12-09 – 2015-12-14 (×11): 600 mg via ORAL
  Filled 2015-12-09 (×11): qty 1

## 2015-12-09 MED ORDER — MOMETASONE FURO-FORMOTEROL FUM 200-5 MCG/ACT IN AERO
2.0000 | INHALATION_SPRAY | Freq: Two times a day (BID) | RESPIRATORY_TRACT | Status: DC
  Filled 2015-12-09: qty 8.8

## 2015-12-09 MED ORDER — IPRATROPIUM BROMIDE 0.02 % IN SOLN: 0.5000 mg | Freq: Four times a day (QID) | RESPIRATORY_TRACT | Status: DC | PRN

## 2015-12-09 MED ORDER — LEVALBUTEROL HCL 0.63 MG/3ML IN NEBU
0.6300 mg | INHALATION_SOLUTION | Freq: Four times a day (QID) | RESPIRATORY_TRACT | Status: DC
  Filled 2015-12-09: qty 3

## 2015-12-09 NOTE — Progress Notes (Signed)
*  PRELIMINARY RESULTS* Echocardiogram 2D Echocardiogram has been performed.  David Stein 12/09/2015, 8:39 AM

## 2015-12-09 NOTE — Progress Notes (Signed)
Pharmacy Antibiotic Note  David Stein is a 70 y.o. male admitted on 12/08/2015 with pneumonia.  Pharmacy has been consulted for renal dose adjustment of antibiotics.  Levaquin 750 mg po q24 hours ordered for 5 days  Plan: Cont levaquin as ordered F/u renal function, cultures and clinical course  Height: 6\' 2"  (188 cm) Weight: 191 lb 9.3 oz (86.9 kg) IBW/kg (Calculated) : 82.2  Temp (24hrs), Avg:98.3 F (36.8 C), Min:97.9 F (36.6 C), Max:98.6 F (37 C)   Recent Labs Lab 12/08/15 1436 12/08/15 1555 12/09/15 0356  WBC 13.5*  --  11.4*  CREATININE 0.85  --  0.73  LATICACIDVEN  --  1.1  --     Estimated Creatinine Clearance: 99.9 mL/min (by C-G formula based on SCr of 0.73 mg/dL).    Allergies  Allergen Reactions  . Codeine Other (See Comments)    headache     Thank you for allowing pharmacy to be a part of this patient's care.  David Stein 12/09/2015 9:48 AM

## 2015-12-09 NOTE — Progress Notes (Signed)
[facility] Progress Note  Patient: David Stein [id number]   PCP: Leonides Grills, MD DOB: 1945-12-26   DOA: 12/08/2015   DOS: 12/09/2015   Date of Service: the patient was seen and examined on 12/09/2015  Brief hospital course: Pt. with PMH of COPD, chronic respiratory failure on 2.5 L of oxygen, chronic prednisone, restless leg syndrome; admitted on 12/08/2015, with complaint of shortness of breath, was found to have A. fib with RVR. Currently further plan is continue Cardizem and infusion and monitor in the step down unit.  Assessment and Plan: 1. Atrial flutter with rapid ventricular response ([place]) Patient started on Cardizem infusion. We'll continue to monitor the patient in the step down unit. Cardiology consulted. Patient's CHA2DS2-VASc score is 1, even though patient was started on Apixaban in the ER. We'll monitor for recommendation from cardiology prior to stopping it. I provided him suggestive of 60-65% EF. no wall motion abnormality.  2. Community-acquired pneumonia. X-[place] shows left basal infiltrate. Next line with worsening respiratory distress, fever ongoing for one week patient is suspected to have pneumonia. Patient is on Levaquin which will finish 5 days treatment course. Continue to monitor culture.  3. History of COPD, chronic respiratory failure. Patient is a 2.5 L of oxygen. Currently appears to be in distress more than his usual. With this I will increase the patient's steroids. Initial plan was to switch the patient to nebulizers although the respiratory therapist recommends to use nebulizer only as needed. Will resume patient's home inhaler.  Pain management: When necessary Tylenol Activity: Consulted physical therapy Bowel regimen: last BM prior to admission Diet: Cardiac diet DVT Prophylaxis: subcutaneous Heparin  Advance goals of care discussion: Full code  Family Communication: family was present at bedside, at the time of interview. The pt  provided permission to discuss medical plan with the family. Opportunity was given to ask question and all questions were answered satisfactorily.   Disposition:  Discharge to home with likely home health. Expected discharge date: 12/11/2015, improvement in A. fib  Consultants: Cardiology Procedures: Echocardiogram  Antibiotics: Anti-infectives    Start     Dose/Rate Route Frequency Ordered Stop   12/09/15 1700  levofloxacin (LEVAQUIN) tablet 750 mg     750 mg Oral Every 24 hours 12/08/15 1742 12/14/15 1659   12/08/15 1545  cefTRIAXone (ROCEPHIN) 1 g in dextrose 5 % 50 mL IVPB  Status:  Discontinued     1 g 100 mL/hr over 30 Minutes Intravenous Every 24 hours 12/08/15 1538 12/08/15 2134   12/08/15 1545  azithromycin (ZITHROMAX) 500 mg in dextrose 5 % 250 mL IVPB  Status:  Discontinued     500 mg 250 mL/hr over 60 Minutes Intravenous Every 24 hours 12/08/15 1538 12/08/15 1749        Subjective: Patient is feeling better although appears to be in respiratory distress. No cough no fever here no nausea no vomiting. No abdominal pain. Nutrition: Tolerating oral diet  Objective: Physical Exam: Vitals:   12/09/15 0400 12/09/15 0500 12/09/15 0731 12/09/15 0830  BP:   101/60 (!) 95/58  Pulse:   76 69  Resp:   20 (!) 27  Temp: 97.9 F (36.6 C)  98.3 F (36.8 C)   TempSrc: Axillary  Oral   SpO2:   97% 96%  Weight:  86.9 kg (191 lb 9.3 oz)    Height:        Intake/Output Summary (Last 24 hours) at 12/09/15 1718 Last data filed at 12/09/15 1559  Gross per  24 hour  Intake           251.96 ml  Output             1050 ml  Net          -798.04 ml   Filed Weights   12/08/15 1415 12/08/15 1800 12/09/15 0500  Weight: 81.6 kg (180 lb) 86.1 kg (189 lb 13.1 oz) 86.9 kg (191 lb 9.3 oz)    General: Alert, Awake and Oriented to Time, Place and Person. Appear in moderate distress, affect appropriate Eyes: PERRL, Conjunctiva normal ENT: Oral Mucosa clear moist. Neck: positive JVD, no  Abnormal Mass Or lumps Cardiovascular: S1 and S2 Present, no Murmur, Respiratory: Bilateral Air entry equal and Decreased, no use of accessory muscle, no Crackles, bilateral wheezes Abdomen: Bowel Sound present, Soft and no tenderness Skin: no redness, no Rash, no induration Extremities: no Pedal edema, no calf tenderness Neurologic: Grossly no focal neuro deficit. Bilaterally Equal motor strength  Data Reviewed: CBC:  Recent Labs Lab 12/08/15 1436 12/09/15 0356  WBC 13.5* 11.4*  HGB 13.1 11.1*  HCT 42.1 36.1*  MCV 101.0* 102.8*  PLT 180 123XX123   Basic Metabolic Panel:  Recent Labs Lab 12/08/15 1436 12/09/15 0356  NA 135 135  K 3.9 4.2  CL 90* 96*  CO2 38* 38*  GLUCOSE 103* 96  BUN 16 10  CREATININE 0.85 0.73  CALCIUM 9.2 8.0*    Liver Function Tests: No results for input(s): AST, ALT, ALKPHOS, BILITOT, PROT, ALBUMIN in the last 168 hours. No results for input(s): LIPASE, AMYLASE in the last 168 hours. No results for input(s): AMMONIA in the last 168 hours. Coagulation Profile:  Recent Labs Lab 12/08/15 1751  INR 1.07   Cardiac Enzymes:  Recent Labs Lab 12/08/15 1436  TROPONINI <0.03   BNP (last 3 results) No results for input(s): PROBNP in the last 8760 hours.  CBG: No results for input(s): GLUCAP in the last 168 hours.  Studies: No results found.   Scheduled Meds: . apixaban  5 mg Oral BID  . guaiFENesin  600 mg Oral BID  . levofloxacin  750 mg Oral Q24H  . methylPREDNISolone (SOLU-MEDROL) injection  60 mg Intravenous Q12H  . rOPINIRole  0.25 mg Oral QHS   Continuous Infusions: . diltiazem (CARDIZEM) infusion 13 mg/hr (12/09/15 1559)   PRN Meds: albuterol, ipratropium, levalbuterol  Time spent: 30 minutes  Author: Berle Mull, MD [facility] Pager: [phone] 12/09/2015 5:18 PM  If 7PM-7AM, please contact night-coverage at [email or web address], password [facility]

## 2015-12-10 DIAGNOSIS — I4892 Unspecified atrial flutter: Principal | ICD-10-CM

## 2015-12-10 LAB — COMPREHENSIVE METABOLIC PANEL
ALT: 18 U/L (ref 17–63)
ANION GAP: 4 — AB (ref 5–15)
AST: 17 U/L (ref 15–41)
Albumin: 3.4 g/dL — ABNORMAL LOW (ref 3.5–5.0)
Alkaline Phosphatase: 65 U/L (ref 38–126)
BUN: 11 mg/dL (ref 6–20)
CHLORIDE: 93 mmol/L — AB (ref 101–111)
CO2: 37 mmol/L — AB (ref 22–32)
Calcium: 8.9 mg/dL (ref 8.9–10.3)
Creatinine, Ser: 0.58 mg/dL — ABNORMAL LOW (ref 0.61–1.24)
GFR calc non Af Amer: >60 mL/min (ref >60)
Glucose, Bld: 165 mg/dL — ABNORMAL HIGH (ref 65–99)
Potassium: 4.5 mmol/L (ref 3.5–5.1)
SODIUM: 134 mmol/L — AB (ref 135–145)
Total Bilirubin: 0.3 mg/dL (ref 0.3–1.2)
Total Protein: 6.6 g/dL (ref 6.5–8.1)

## 2015-12-10 LAB — CBC WITH DIFFERENTIAL/PLATELET
Basophils Absolute: 0 10*3/uL (ref 0.0–0.1)
Basophils Relative: 0 %
EOS ABS: 0 10*3/uL (ref 0.0–0.7)
EOS PCT: 0 %
HCT: 38.8 % — ABNORMAL LOW (ref 39.0–52.0)
Hemoglobin: 12 g/dL — ABNORMAL LOW (ref 13.0–17.0)
LYMPHS ABS: 0.5 10*3/uL — AB (ref 0.7–4.0)
Lymphocytes Relative: 5 %
MCH: 31.1 pg (ref 26.0–34.0)
MCHC: 30.9 g/dL (ref 30.0–36.0)
MCV: 100.5 fL — ABNORMAL HIGH (ref 78.0–100.0)
MONO ABS: 0.3 10*3/uL (ref 0.1–1.0)
MONOS PCT: 3 %
Neutro Abs: 8.3 10*3/uL — ABNORMAL HIGH (ref 1.7–7.7)
Neutrophils Relative %: 92 %
PLATELETS: 177 10*3/uL (ref 150–400)
RBC: 3.86 MIL/uL — ABNORMAL LOW (ref 4.22–5.81)
RDW: 12.8 % (ref 11.5–15.5)
WBC: 9.1 10*3/uL (ref 4.0–10.5)

## 2015-12-10 LAB — STREP PNEUMONIAE URINARY ANTIGEN: Strep Pneumo Urinary Antigen: NEGATIVE

## 2015-12-10 LAB — PROTIME-INR
INR: 1.3
PROTHROMBIN TIME: 16.3 s — AB (ref 11.4–15.2)

## 2015-12-10 LAB — MAGNESIUM: Magnesium: 2.1 mg/dL (ref 1.7–2.4)

## 2015-12-10 MED ORDER — ACLIDINIUM BROMIDE 400 MCG/ACT IN AEPB: 1.0000 | INHALATION_SPRAY | Freq: Every day | RESPIRATORY_TRACT | Status: DC

## 2015-12-10 MED ORDER — DILTIAZEM HCL 30 MG PO TABS
30.0000 mg | ORAL_TABLET | Freq: Four times a day (QID) | ORAL | Status: DC
  Administered 2015-12-10 – 2015-12-11 (×4): 30 mg via ORAL
  Filled 2015-12-10 (×4): qty 1

## 2015-12-10 MED ORDER — DILTIAZEM HCL 100 MG IV SOLR
5.0000 mg/h | INTRAVENOUS | Status: DC
  Administered 2015-12-10 (×2): 7.5 mg/h via INTRAVENOUS
  Filled 2015-12-10 (×2): qty 100

## 2015-12-10 NOTE — Consult Note (Signed)
CARDIOLOGY CONSULT NOTE   Patient ID: ARTH HEDINGER MRN: [id number] DOB/AGE: 11-02-1945 70 y.o.  Admit Date: 12/08/2015 Referring Physician: TRH-Patel  Primary Physician: Leonides Grills, MD Consulting Cardiologist: Carlyle Dolly MD Primary Cardiologist: New Reason for Consultation: Atrial fib with RVR  Clinical Summary Mr. Haverty is a 70 y.o.male with no prior history of coronary artery disease or cardiology evaluation, known history of chronic respiratory failure, oxygen dependent COPD, arthritis, who presented to the emergency room with one-week history of persistent dyspnea, worsening with activity and improved with rest. He recently been treated with antibiotics by primary care physician in the setting of respiratory infection.  He states symptoms began about 5 days ago with lower extremity edema, worsening dyspnea, coughing and congestion. He was able to feel his heart rate racing. However, he states he's been feeling his heart rate go up for the last few months. It would cause some minor chest discomfort on the left, but go away on its own. He attributed to his chronic infections and COPD.  EKG in the emergency room, the patient was found to have right bundle Atziri Zubiate block and tachycardia per EKG, but apparently was diagnosed with atrial flutter with RVR on telemetry. Heart rate was found to be 155 bpm. He was started on diltiazem drip after IV loading, and allso placed on ELIQUIS 5 mg twice a day Labs on arrival sodium 135 glucose 103, creatinine 0.85. White blood cells were elevated at 13.5 (patient is on steroids at home), hemoglobin 13.1, hematocrit 42.1. Chest x-Koal revealed minimal focal opacification, over the left base either due to atelectasis or infection. He was found to have COPD. He is currently being treated with antibiotics, Rocephin and Zithromax. He remains on diltiazem gtt at 9 mg/hour.   He is currently comfortable, breathing much better, without complaints of  chest discomfort.  Allergies  Allergen Reactions  . Codeine Other (See Comments)    headache    Medications Scheduled Medications: . apixaban  5 mg Oral BID  . guaiFENesin  600 mg Oral BID  . levofloxacin  750 mg Oral Q24H  . methylPREDNISolone (SOLU-MEDROL) injection  60 mg Intravenous Q12H  . mometasone-formoterol  2 puff Inhalation BID  . rOPINIRole  0.25 mg Oral QHS  . tiotropium  18 mcg Inhalation Daily     Infusions: . diltiazem (CARDIZEM) infusion 9 mg/hr (12/10/15 0420)     PRN Medications:  albuterol, ipratropium, levalbuterol   Past Medical History:  Diagnosis Date  . Arthritis   . COPD (chronic obstructive pulmonary disease) ([place])   . Kidney stones   . 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 Restless leg syndrome   . Shortness of breath dyspnea     Past Surgical History:  Procedure Laterality Date  . CATARACT EXTRACTION W/PHACO Right 01/31/2014   Procedure: CATARACT EXTRACTION PHACO AND INTRAOCULAR LENS PLACEMENT RIGHT EYE CDE=6.90;  Surgeon: Elta Guadeloupe T. Gershon Crane, MD;  Location: [facility];  Service: Ophthalmology;  Laterality: Right;  . CATARACT EXTRACTION W/PHACO Left 02/14/2014   Procedure: CATARACT EXTRACTION PHACO AND INTRAOCULAR LENS PLACEMENT; CDE:  8.34;  Surgeon: Elta Guadeloupe T. Gershon Crane, MD;  Location: [facility];  Service: Ophthalmology;  Laterality: Left;  . KNEE SURGERY Left     Family History  Problem Relation Age of Onset  . Rheumatic fever Father   . Alzheimer's disease Mother   . Stroke Sister      Social History Mr. Dewaard reports that he has been smoking Cigarettes.  He has a  13.75 pack-year smoking history. He has never used smokeless tobacco. Mr. Langel reports that he does not drink alcohol.  Review of Systems Complete review of systems are found to be negative unless outlined in H&P above.  Physical Examination Blood pressure 120/72, pulse 74, temperature 97 F (36.1 C), temperature source Axillary, resp. rate 14, height 6\' 2"  (1.88 m), weight 190  lb 14.7 oz (86.6 kg), SpO2 99 %.  Intake/Output Summary (Last 24 hours) at 12/10/15 0911 Last data filed at 12/10/15 0700  Gross per 24 hour  Intake            75.02 ml  Output              600 ml  Net          -524.98 ml    Telemetry:Atrial flutter, irregular rates, heart rate 100-110 bpm.  GEN: No acute distress HEENT: Conjunctiva and lids normal, oropharynx clear with moist mucosa. Neck: Supple, no elevated JVP or carotid bruits, no thyromegaly. Lungs: Clear to auscultation, nonlabored breathing at rest. Expiratory wheezes Cardiac: Iregular rate and rhythm, distant heart sounds, no S3 or significant systolic murmur, no pericardial rub. Abdomen: Soft, nontender, no hepatomegaly, bowel sounds present, no guarding or rebound. Extremities: No pitting edema, distal pulses 2+. Skin: Warm and dry. Musculoskeletal: No kyphosis. Neuropsychiatric: Alert and oriented x3, affect grossly appropriate.  Prior Cardiac Testing/Procedures  1.Echocardiogram: 12/09/2015.  Left ventricle: The cavity size was normal. Wall thickness was   normal. Systolic function was normal. The estimated ejection   fraction was in the range of 55% to 60%. Although no diagnostic   regional wall motion abnormality was identified, this possibility   cannot be completely excluded on the basis of this study. - Ventricular septum: Septal motion showed abnormal function,   dyssynergy, and paradox. The contour showed systolic flattening.   These changes are consistent with RV pressure overload.   Lab Results  Basic Metabolic Panel:  Recent Labs Lab 12/08/15 1436 12/09/15 0356 12/10/15 0431  NA 135 135 134*  K 3.9 4.2 4.5  CL 90* 96* 93*  CO2 38* 38* 37*  GLUCOSE 103* 96 165*  BUN 16 10 11   CREATININE 0.85 0.73 0.58*  CALCIUM 9.2 8.0* 8.9  MG  --   --  2.1    Liver Function Tests:  Recent Labs Lab 12/10/15 0431  AST 17  ALT 18  ALKPHOS 65  BILITOT 0.3  PROT 6.6  ALBUMIN 3.4*    CBC:  Recent  Labs Lab 12/08/15 1436 12/09/15 0356 12/10/15 0431  WBC 13.5* 11.4* 9.1  NEUTROABS  --   --  8.3*  HGB 13.1 11.1* 12.0*  HCT 42.1 36.1* 38.8*  MCV 101.0* 102.8* 100.5*  PLT 180 177 177    Cardiac Enzymes:  Recent Labs Lab 12/08/15 1436  TROPONINI <0.03    Radiology: Dg Chest Port 1 View  Result Date: 12/08/2015 CLINICAL DATA:  Chest pain and tachycardia. Worsening shortness of breath over the past 5 days with bilateral peripheral edema. Oxygen dependent. COPD. EXAM: PORTABLE CHEST 1 VIEW COMPARISON:  02/08/2015 FINDINGS: Lungs are hyperinflated with minimal focal opacification in the left base just above the hemidiaphragm which may be due to atelectasis or infection. Evidence of emphysematous disease. Few small stable calcified granulomas. Cardiac silhouette is within normal. There is calcified plaque over the thoracic aorta. Remainder the exam is unchanged. IMPRESSION: Minimal focal opacification over the left base which may be due to atelectasis or infection. Aortic atherosclerosis. COPD.  Electronically Signed   By: Marin Olp M.D.   On: 12/08/2015 15:11     ECG: Sinus rhythm with right bundle-Carmina Walle block. We'll repeat EKG now that he is in atrial fib flutter   Impression and Recommendations  1. New onset Atrial fib/flutter: Of uncertain duration, patient states he has been feeling his heart racing for the last couple of months usually associated with COPD exacerbations and infection. He was able to tell that his heart was racing only during these times, and is unable to tell if his heart rate is irregular now. He is currently on diltiazem drip at 9 mg an hour, ELIQUIS 5 mg twice a day. We'll transition to by mouth diltiazem 30 mg every 6 hours. Blood pressure is stable. Continue ELIQUIS. Left atrium was found to be normal in size.   Can possibly be related to infective process along with COPD. May pharmacologically convert. Dr. Harl Bowie discussed need for DCCV if he does not  after being on Eliquis for a minimum of 3 weeks.   2. COPD with chronic bronchitis: Currently being treated by primary care physician Dr. Luan Pulling with frequent antibiotic therapy and steroids.  3. Lower extremity edema: No evidence of edema on physical exam. Likely related to steroids versus mild heart failure in the setting of rapid heart rhythm. This is now resolved.   Signed: Phill Myron. Lawrence NP [place]  12/10/2015, 9:11 AM Co-Sign MD  Patient seen and discussed with NP Purcell Nails, I agree with her documentation above. 70 yo male with history of COPD on home O2 admitted with SOB. Found to be in aflutter with RVR, started on dilt gtt and started on eliquis for anticoagulation.    K 3.9, Cr 0.85, Hgb 13.1, Plt 180, BNP 279, D-dimer neg, lactic acid 1.1, TSH 1.397,  Trop neg x 1 CXR minimal focal opacification left base, atelectasis vs infection Echo LVEF 55-60%, evidence of RV pressure overload, cannot estimate PASP in absence of TR jet, normal RV size and function described, RA reportedly normal EKG: aflutter rate 150, RAD, RBBB  Pneumonia management per primary team. Aflutter rate controlled with IV dilt, CHADS2 Vasc score is 1 (age), indicating either ASA or anticoag is reasonable. Has been started on eliquis on admission, tolerating, would continue at this time. 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 We will continue rate control with dilt, start to transition to oral. Given his respiratory status would not pursue TEE/DCCV at this time. Hopefully with medication and as the sympathetic/catecholamine drive decreases as his COPD exacerbation and pneumonia improve is rates will be controlled. If dilt fails can consider a trial of amiodarone, would not plan for extended use given his pulmonary disease but would be reasonable for a few weeks course until  acute COPD exacerbation/pneumonia resolved.     Zandra Abts MD

## 2015-12-10 NOTE — Progress Notes (Signed)
PT Cancellation Note  Patient Details Name: David Stein MRN: [id number] DOB: 01/28/1946   Cancelled Treatment:    Reason Eval/Treat Not Completed: Medical issues which prohibited therapy (Spoke with RN, Mysti who states they are still trying to keep his HR under control.  It is currently in the 120's.  Will hold PT today, and check back tomorrow.  )   Eustaquio Maize Fernand Sorbello, PT, DPT X: [phone]

## 2015-12-10 NOTE — Progress Notes (Signed)
[facility] Progress Note  Patient: David Stein [id number]   PCP: Leonides Grills, MD DOB: 1945-10-22   DOA: 12/08/2015   DOS: 12/10/2015   Date of Service: the patient was seen and examined on 12/10/2015  Brief hospital course: Pt. with PMH of COPD, chronic respiratory failure on 2.5 L of oxygen, chronic prednisone, restless leg syndrome; admitted on 12/08/2015, with complaint of shortness of breath, was found to have A. fib with RVR. Currently further plan is continue Cardizem infusion and monitor in the step down unit.  Assessment and Plan: 1. Atrial flutter with rapid ventricular response ([place]) Patient started on Cardizem infusion. We'll continue to monitor the patient in the step down unit. Cardiology consulted. Patient's CHA2DS2-VASc score is 1, even though patient was started on Apixaban in the ER. Echocardiogram suggestive of 60-65% EF. no wall motion abnormality. [place] cardiology consultation.  2. Community-acquired pneumonia. X-[place] shows left basal infiltrate.  with worsening respiratory distress, fever ongoing for one week patient is suspected to have pneumonia. Patient is on Levaquin which will finish 5 days treatment course. Continue to monitor culture.  3. History of COPD, chronic respiratory failure. Patient is a 2.5 L of oxygen. Currently appears to be in distress more than his usual. Continue IV steroids. Initial plan was to switch the patient to nebulizers although the respiratory therapist recommends to use nebulizer only as needed. Will resume patient's home inhaler.  Pain management: When necessary Tylenol Activity: Consulted physical therapy Bowel regimen: last BM 12/08/2015 Diet: Cardiac diet DVT Prophylaxis: subcutaneous Heparin  Advance goals of care discussion: Full code  Family Communication: family was present at bedside, at the time of interview. The pt provided permission to discuss medical plan with the family. Opportunity was given  to ask question and all questions were answered satisfactorily.   Disposition:  Discharge to home with likely home health. Expected discharge date: 12/11/2015, improvement in A. fib  Consultants: Cardiology Procedures: Echocardiogram  Antibiotics: Anti-infectives    Start     Dose/Rate Route Frequency Ordered Stop   12/09/15 1700  levofloxacin (LEVAQUIN) tablet 750 mg     750 mg Oral Every 24 hours 12/08/15 1742 12/14/15 1659   12/08/15 1545  cefTRIAXone (ROCEPHIN) 1 g in dextrose 5 % 50 mL IVPB  Status:  Discontinued     1 g 100 mL/hr over 30 Minutes Intravenous Every 24 hours 12/08/15 1538 12/08/15 2134   12/08/15 1545  azithromycin (ZITHROMAX) 500 mg in dextrose 5 % 250 mL IVPB  Status:  Discontinued     500 mg 250 mL/hr over 60 Minutes Intravenous Every 24 hours 12/08/15 1538 12/08/15 1749        Subjective: Patient is feeling better although appears to be in respiratory distress. No cough no fever here no nausea no vomiting. No abdominal pain. Nutrition: Tolerating oral diet  Objective: Physical Exam: Vitals:   12/10/15 1100 12/10/15 1130 12/10/15 1155 12/10/15 1200  BP: 106/67 122/67  115/72  Pulse: (!) 48 (!) 50  87  Resp: 19 (!) 21  (!) 21  Temp:   97.6 F (36.4 C)   TempSrc:   Oral   SpO2: 96% 97%  98%  Weight:      Height:        Intake/Output Summary (Last 24 hours) at 12/10/15 1542 Last data filed at 12/10/15 1200  Gross per 24 hour  Intake           256.92 ml  Output  1000 ml  Net          -743.08 ml   Filed Weights   12/08/15 1800 12/09/15 0500 12/10/15 0500  Weight: 86.1 kg (189 lb 13.1 oz) 86.9 kg (191 lb 9.3 oz) 86.6 kg (190 lb 14.7 oz)    General: Alert, Awake and Oriented to Time, Place and Person. Appear in moderate distress, affect appropriate Eyes: PERRL, Conjunctiva normal ENT: Oral Mucosa clear moist. Neck: positive JVD, no Abnormal Mass Or lumps Cardiovascular: S1 and S2 Present, no Murmur, Respiratory: Bilateral Air entry  equal and Decreased, no use of accessory muscle, no Crackles, bilateral wheezes Abdomen: Bowel Sound present, Soft and no tenderness Skin: no redness, no Rash, no induration Extremities: no Pedal edema, no calf tenderness Neurologic: Grossly no focal neuro deficit. Bilaterally Equal motor strength  Data Reviewed: CBC:  Recent Labs Lab 12/08/15 1436 12/09/15 0356 12/10/15 0431  WBC 13.5* 11.4* 9.1  NEUTROABS  --   --  8.3*  HGB 13.1 11.1* 12.0*  HCT 42.1 36.1* 38.8*  MCV 101.0* 102.8* 100.5*  PLT 180 177 [id number]   Basic Metabolic Panel:  Recent Labs Lab 12/08/15 1436 12/09/15 0356 12/10/15 0431  NA 135 135 134*  K 3.9 4.2 4.5  CL 90* 96* 93*  CO2 38* 38* 37*  GLUCOSE 103* 96 165*  BUN 16 10 11   CREATININE 0.85 0.73 0.58*  CALCIUM 9.2 8.0* 8.9  MG  --   --  2.1    Liver Function Tests:  Recent Labs Lab 12/10/15 0431  AST 17  ALT 18  ALKPHOS 65  BILITOT 0.3  PROT 6.6  ALBUMIN 3.4*   No results for input(s): LIPASE, AMYLASE in the last 168 hours. No results for input(s): AMMONIA in the last 168 hours. Coagulation Profile:  Recent Labs Lab 12/08/15 1751 12/10/15 0431  INR 1.07 1.30   Cardiac Enzymes:  Recent Labs Lab 12/08/15 1436  TROPONINI <0.03   BNP (last 3 results) No results for input(s): PROBNP in the last 8760 hours.  CBG: No results for input(s): GLUCAP in the last 168 hours.  Studies: No results found.   Scheduled Meds: . Aclidinium Bromide  1 puff Inhalation Daily  . apixaban  5 mg Oral BID  . diltiazem  30 mg Oral Q6H  . guaiFENesin  600 mg Oral BID  . levofloxacin  750 mg Oral Q24H  . methylPREDNISolone (SOLU-MEDROL) injection  60 mg Intravenous Q12H  . mometasone-formoterol  2 puff Inhalation BID  . rOPINIRole  0.25 mg Oral QHS   Continuous Infusions:   PRN Meds: albuterol, ipratropium, levalbuterol  Time spent: 30 minutes  Author: Berle Mull, MD [facility] Pager: [phone] 12/10/2015 3:42 PM  If  7PM-7AM, please contact night-coverage at [email or web address], password [facility]

## 2015-12-10 NOTE — Care Management Important Message (Signed)
Important Message  Patient Details  Name: David Stein MRN: [id number] Date of Birth: June 18, 1945   Medicare Important Message Given:  Yes    Sherald Barge, RN 12/10/2015, 2:53 PM

## 2015-12-10 NOTE — Discharge Instructions (Signed)
Apixaban oral tablets °What is this medicine? °APIXABAN (a PIX a ban) is an anticoagulant (blood thinner). It is used to lower the chance of stroke in people with a medical condition called atrial fibrillation. It is also used to treat or prevent blood clots in the lungs or in the veins. °This medicine may be used for other purposes; ask your health care provider or pharmacist if you have questions. °What should I tell my health care provider before I take this medicine? °They need to know if you have any of these conditions: °-bleeding disorders °-bleeding in the brain °-blood in your stools (black or tarry stools) or if you have blood in your vomit °-history of stomach bleeding °-kidney disease °-liver disease °-mechanical heart valve °-an unusual or allergic reaction to apixaban, other medicines, foods, dyes, or preservatives °-pregnant or trying to get pregnant °-breast-feeding °How should I use this medicine? °Take this medicine by mouth with a glass of water. Follow the directions on the prescription label. You can take it with or without food. If it upsets your stomach, take it with food. Take your medicine at regular intervals. Do not take it more often than directed. Do not stop taking except on your doctor's advice. Stopping this medicine may increase your risk of a blot clot. Be sure to refill your prescription before you run out of medicine. °Talk to your pediatrician regarding the use of this medicine in children. Special care may be needed. °Overdosage: If you think you have taken too much of this medicine contact a poison control center or emergency room at once. °NOTE: This medicine is only for you. Do not share this medicine with others. °What if I miss a dose? °If you miss a dose, take it as soon as you can. If it is almost time for your next dose, take only that dose. Do not take double or extra doses. °What may interact with this medicine? °This medicine may interact with the following: °-aspirin  and aspirin-like medicines °-certain medicines for fungal infections like ketoconazole and itraconazole °-certain medicines for seizures like carbamazepine and phenytoin °-certain medicines that treat or prevent blood clots like warfarin, enoxaparin, and dalteparin °-clarithromycin °-NSAIDs, medicines for pain and inflammation, like ibuprofen or naproxen °-rifampin °-ritonavir °-St. John's wort °This list may not describe all possible interactions. Give your health care provider a list of all the medicines, herbs, non-prescription drugs, or dietary supplements you use. Also tell them if you smoke, drink alcohol, or use illegal drugs. Some items may interact with your medicine. °What should I watch for while using this medicine? °Notify your doctor or health care professional and seek emergency treatment if you develop breathing problems; changes in vision; chest pain; severe, sudden headache; pain, swelling, warmth in the leg; trouble speaking; sudden numbness or weakness of the face, arm, or leg. These can be signs that your condition has gotten worse. °If you are going to have surgery, tell your doctor or health care professional that you are taking this medicine. °Tell your health care professional that you use this medicine before you have a spinal or epidural procedure. Sometimes people who take this medicine have bleeding problems around the spine when they have a spinal or epidural procedure. This bleeding is very rare. If you have a spinal or epidural procedure while on this medicine, call your health care professional immediately if you have back pain, numbness or tingling (especially in your legs and feet), muscle weakness, paralysis, or loss of bladder or bowel   control. °Avoid sports and activities that might cause injury while you are using this medicine. Severe falls or injuries can cause unseen bleeding. Be careful when using sharp tools or knives. Consider using an electric razor. Take special care  brushing or flossing your teeth. Report any injuries, bruising, or red spots on the skin to your doctor or health care professional. °What side effects may I notice from receiving this medicine? °Side effects that you should report to your doctor or health care professional as soon as possible: °-allergic reactions like skin rash, itching or hives, swelling of the face, lips, or tongue °-signs and symptoms of bleeding such as bloody or black, tarry stools; red or dark-brown urine; spitting up blood or brown material that looks like coffee grounds; red spots on the skin; unusual bruising or bleeding from the eye, gums, or nose °This list may not describe all possible side effects. Call your doctor for medical advice about side effects. You may report side effects to FDA at 1-800-FDA-1088. °Where should I keep my medicine? °Keep out of the reach of children. °Store at room temperature between 20 and 25 degrees C (68 and 77 degrees F). Throw away any unused medicine after the expiration date. °NOTE: This sheet is a summary. It may not cover all possible information. If you have questions about this medicine, talk to your doctor, pharmacist, or health care provider. °  °© 2016, [employer]. (2012-11-12 11:59:24) ° °

## 2015-12-11 LAB — COMPREHENSIVE METABOLIC PANEL
ALT: 20 U/L (ref 17–63)
ANION GAP: 6 (ref 5–15)
AST: 18 U/L (ref 15–41)
Albumin: 3.2 g/dL — ABNORMAL LOW (ref 3.5–5.0)
Alkaline Phosphatase: 58 U/L (ref 38–126)
BUN: 15 mg/dL (ref 6–20)
CHLORIDE: 91 mmol/L — AB (ref 101–111)
CO2: 37 mmol/L — AB (ref 22–32)
CREATININE: 0.75 mg/dL (ref 0.61–1.24)
Calcium: 8.9 mg/dL (ref 8.9–10.3)
Glucose, Bld: 156 mg/dL — ABNORMAL HIGH (ref 65–99)
POTASSIUM: 4.7 mmol/L (ref 3.5–5.1)
SODIUM: 134 mmol/L — AB (ref 135–145)
Total Bilirubin: 0.5 mg/dL (ref 0.3–1.2)
Total Protein: 6.3 g/dL — ABNORMAL LOW (ref 6.5–8.1)

## 2015-12-11 LAB — MAGNESIUM: MAGNESIUM: 2.1 mg/dL (ref 1.7–2.4)

## 2015-12-11 MED ORDER — AMIODARONE HCL IN DEXTROSE 360-4.14 MG/200ML-% IV SOLN
60.0000 mg/h | INTRAVENOUS | Status: AC
  Administered 2015-12-11: 60 mg/h via INTRAVENOUS
  Filled 2015-12-11: qty 200

## 2015-12-11 MED ORDER — DILTIAZEM HCL 30 MG PO TABS: 45.0000 mg | ORAL_TABLET | Freq: Four times a day (QID) | ORAL | Status: DC

## 2015-12-11 MED ORDER — AMIODARONE HCL IN DEXTROSE 360-4.14 MG/200ML-% IV SOLN
30.0000 mg/h | INTRAVENOUS | Status: DC
  Administered 2015-12-11 – 2015-12-12 (×4): 30 mg/h via INTRAVENOUS
  Filled 2015-12-11: qty 400
  Filled 2015-12-11 (×3): qty 200

## 2015-12-11 MED ORDER — SENNOSIDES-DOCUSATE SODIUM 8.6-50 MG PO TABS
2.0000 | ORAL_TABLET | Freq: Every evening | ORAL | Status: DC | PRN
  Administered 2015-12-11: 2 via ORAL
  Filled 2015-12-11: qty 2

## 2015-12-11 MED ORDER — DILTIAZEM HCL 30 MG PO TABS
30.0000 mg | ORAL_TABLET | Freq: Four times a day (QID) | ORAL | Status: DC
  Administered 2015-12-11 – 2015-12-12 (×4): 30 mg via ORAL
  Filled 2015-12-11 (×4): qty 1

## 2015-12-11 MED ORDER — AMIODARONE LOAD VIA INFUSION
150.0000 mg | Freq: Once | INTRAVENOUS | Status: AC
  Administered 2015-12-11: 150 mg via INTRAVENOUS
  Filled 2015-12-11: qty 83.34

## 2015-12-11 MED ORDER — DILTIAZEM HCL 30 MG PO TABS
15.0000 mg | ORAL_TABLET | Freq: Once | ORAL | Status: AC
  Administered 2015-12-11: 15 mg via ORAL
  Filled 2015-12-11: qty 1

## 2015-12-11 MED ORDER — PREDNISONE 10 MG PO TABS
50.0000 mg | ORAL_TABLET | Freq: Every day | ORAL | Status: DC
  Administered 2015-12-11 – 2015-12-14 (×4): 50 mg via ORAL
  Filled 2015-12-11 (×5): qty 2

## 2015-12-11 MED ORDER — POLYETHYLENE GLYCOL 3350 17 G PO PACK
17.0000 g | PACK | Freq: Every day | ORAL | Status: DC
  Administered 2015-12-11 – 2015-12-14 (×4): 17 g via ORAL
  Filled 2015-12-11 (×4): qty 1

## 2015-12-11 NOTE — Progress Notes (Signed)
PT Cancellation Note  Patient Details Name: David Stein MRN: [id number] DOB: 1945/10/11   Cancelled Treatment:    Reason Eval/Treat Not Completed: Patient not medically ready (Pt continues to have elevated resting HR at 120-130bpm while the pt is resting supine in bed.  Will check back as schedule allows. )   Beth Zephaniah Lubrano, PT, DPT X: [phone]

## 2015-12-11 NOTE — Progress Notes (Signed)
MEDICATION RELATED CONSULT NOTE - INITIAL   Pharmacy Consult for Amiodarone > review for possible drug interactions Indication: amiodarone added to medication regimen  Allergies  Allergen Reactions  . Codeine Other (See Comments)    headache   Patient Measurements: Height: 6\' 2"  (188 cm) Weight: 190 lb 14.7 oz (86.6 kg) IBW/kg (Calculated) : 82.2  Vital Signs: Temp: 98.2 F (36.8 C) (09/19 1142) Temp Source: Oral (09/19 1142) BP: 116/81 (09/19 1300) Pulse Rate: 140 (09/19 1300) Intake/Output from previous day: 09/18 0701 - 09/19 0700 In: 1866.2 [P.O.:1560; I.V.:306.2] Out: 2500 [Urine:2500] Intake/Output from this shift: Total I/O In: 240 [P.O.:240] Out: 300 [Urine:300]  Labs:  Recent Labs  12/08/15 1436 12/09/15 0356 12/10/15 0431 12/11/15 0431  WBC 13.5* 11.4* 9.1  --   HGB 13.1 11.1* 12.0*  --   HCT 42.1 36.1* 38.8*  --   PLT 180 177 177  --   CREATININE 0.85 0.73 0.58* 0.75  MG  --   --  2.1 2.1  ALBUMIN  --   --  3.4* 3.2*  PROT  --   --  6.6 6.3*  AST  --   --  17 18  ALT  --   --  18 20  ALKPHOS  --   --  65 58  BILITOT  --   --  0.3 0.5   Estimated Creatinine Clearance: 99.9 mL/min (by C-G formula based on SCr of 0.75 mg/dL).  Microbiology: Recent Results (from the past 720 hour(s))  MRSA PCR Screening     Status: None   Collection Time: 12/08/15  6:00 PM  Result Value Ref Range Status   MRSA by PCR NEGATIVE NEGATIVE Final    Comment:        The GeneXpert MRSA Assay (FDA approved for NASAL specimens only), is one component of a comprehensive MRSA colonization surveillance program. It is not intended to diagnose MRSA infection nor to guide or monitor treatment for MRSA infections.    Medical History: Past Medical History:  Diagnosis Date  . Arthritis   . COPD (chronic obstructive pulmonary disease) ([place])   . Kidney stones   . 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 Restless leg syndrome   . Shortness of breath dyspnea    Medications:   Scheduled:  Marland Kitchen Aclidinium Bromide  1 puff Inhalation Daily  . apixaban  5 mg Oral BID  . diltiazem  30 mg Oral Q6H  . guaiFENesin  600 mg Oral BID  . levofloxacin  750 mg Oral Q24H  . mometasone-formoterol  2 puff Inhalation BID  . polyethylene glycol  17 g Oral Daily  . predniSONE  50 mg Oral Q breakfast  . rOPINIRole  0.25 mg Oral QHS   Assessment: 70yo male in ICU with Afib with RVR, trouble breathing and heart racing.  Cardiology has started amiodarone.  Potential drug interactions listed below:  + diltiazem:  Recommended to use alternative Rx.  Monitor BP, ECG.  Combo may increase risk of hypotension, bradycardia, AV block, and arrhythmias.  + Levaquin:  Combo may increase risk of QT prolongation, cardiac arrhythmias.  Consider alternative ABX (such as Rocephin IV or Ceftin PO) + Prednisone:  Monitor K+, electrolytes.  Caution advised: combo may increase risk of QT prolongation and arrhythmias.  May increase risk of hypokalemia and other electrolyte abnormalities. + Apixaban:  Caution advised:  Combo may increase risk of bleeding (increase apixaban levels)  Goal of Therapy:  Reduce risk of adverse effects.  Plan:  Monitor pt closely >  BP, electrolytes, HR, EKG Consider d/c Levaquin and switch to alternative ABX (eg, Rocephin IV or Ceftin PO)  Ondine Gemme A 12/11/2015,1:55 PM

## 2015-12-11 NOTE — Progress Notes (Signed)
[facility] Progress Note  Patient: David Stein [id number]   PCP: Leonides Grills, MD DOB: 1945-05-16   DOA: 12/08/2015   DOS: 12/11/2015   Date of Service: the patient was seen and examined on 12/11/2015  Brief hospital course: Pt. with PMH of COPD, chronic respiratory failure on 2.5 L of oxygen, chronic prednisone, restless leg syndrome; admitted on 12/08/2015, with complaint of shortness of breath, was found to have A. fib with RVR. Currently further plan is continue Cardizem infusion and monitor in the step down unit.  Assessment and Plan: 1. Atrial flutter with rapid ventricular response ([place]) We'll continue to monitor the patient in the step down unit. Cardiology consulted. Patient's CHA2DS2-VASc score is 1,  patient was started on Apixaban in the ER. Echocardiogram suggestive of 60-65% EF. no wall motion abnormality. [facility] cardiology consultation. Started on amiodarone today. Continue Cardizem.  2. Community-acquired pneumonia. X-Rayen shows left basal infiltrate.  with worsening respiratory distress, fever ongoing for one week patient is suspected to have pneumonia. Patient is on Levaquin which will finish 5 days treatment course. Continue to monitor culture.  3. History of COPD, chronic respiratory failure. Patient is a 2.5 L of oxygen. Currently appears to be in distress more than his usual. Changing IV steroids to oral prednisone. Continue patient's home inhaler.  Pain management: When necessary Tylenol Activity: Consulted physical therapy Bowel regimen: last BM 12/08/2015, bowel regimen ordered Diet: Cardiac diet DVT Prophylaxis: Therapeutic anticoagulation  Advance goals of care discussion: Full code  Family Communication: no family was present at bedside, at the time of interview.  Disposition:  Discharge to home with likely home health. Expected discharge date: 12/13/2015, improvement in A. fib  Consultants: Cardiology Procedures:  Echocardiogram  Antibiotics: Anti-infectives    Start     Dose/Rate Route Frequency Ordered Stop   12/09/15 1700  levofloxacin (LEVAQUIN) tablet 750 mg     750 mg Oral Every 24 hours 12/08/15 1742 12/14/15 1659   12/08/15 1545  cefTRIAXone (ROCEPHIN) 1 g in dextrose 5 % 50 mL IVPB  Status:  Discontinued     1 g 100 mL/hr over 30 Minutes Intravenous Every 24 hours 12/08/15 1538 12/08/15 2134   12/08/15 1545  azithromycin (ZITHROMAX) 500 mg in dextrose 5 % 250 mL IVPB  Status:  Discontinued     500 mg 250 mL/hr over 60 Minutes Intravenous Every 24 hours 12/08/15 1538 12/08/15 1749        Subjective: Patient is feeling better although appears to be in respiratory distress. No cough no fever here no nausea no vomiting. No abdominal pain. Nutrition: Tolerating oral diet  Objective: Physical Exam: Vitals:   12/11/15 1400 12/11/15 1500 12/11/15 1522 12/11/15 1704  BP: (!) 110/93 (!) 131/102 116/69   Pulse: (!) 145 71 (!) 134   Resp: (!) 25 (!) 25 19   Temp:    98 F (36.7 C)  TempSrc:    Oral  SpO2: 97% 97% 98%   Weight:      Height:        Intake/Output Summary (Last 24 hours) at 12/11/15 1738 Last data filed at 12/11/15 1517  Gross per 24 hour  Intake          1268.17 ml  Output             2225 ml  Net          -956.83 ml   Filed Weights   12/09/15 0500 12/10/15 0500 12/11/15 0500  Weight: 86.9 kg (  191 lb 9.3 oz) 86.6 kg (190 lb 14.7 oz) 86.6 kg (190 lb 14.7 oz)    General: Alert, Awake and Oriented to Time, Place and Person. Appear in moderate distress, affect appropriate Eyes: PERRL, Conjunctiva normal ENT: Oral Mucosa clear moist. Neck: positive JVD, no Abnormal Mass Or lumps Cardiovascular: S1 and S2 Present, no Murmur, Respiratory: Bilateral Air entry equal and Decreased, no use of accessory muscle, no Crackles, bilateral wheezes Abdomen: Bowel Sound present, Soft and no tenderness Skin: no redness, no Rash, no induration Extremities: no Pedal edema, no calf  tenderness Neurologic: Grossly no focal neuro deficit. Bilaterally Equal motor strength  Data Reviewed: CBC:  Recent Labs Lab 12/08/15 1436 12/09/15 0356 12/10/15 0431  WBC 13.5* 11.4* 9.1  NEUTROABS  --   --  8.3*  HGB 13.1 11.1* 12.0*  HCT 42.1 36.1* 38.8*  MCV 101.0* 102.8* 100.5*  PLT 180 177 [id number]   Basic Metabolic Panel:  Recent Labs Lab 12/08/15 1436 12/09/15 0356 12/10/15 0431 12/11/15 0431  NA 135 135 134* 134*  K 3.9 4.2 4.5 4.7  CL 90* 96* 93* 91*  CO2 38* 38* 37* 37*  GLUCOSE 103* 96 165* 156*  BUN 16 10 11 15   CREATININE 0.85 0.73 0.58* 0.75  CALCIUM 9.2 8.0* 8.9 8.9  MG  --   --  2.1 2.1    Liver Function Tests:  Recent Labs Lab 12/10/15 0431 12/11/15 0431  AST 17 18  ALT 18 20  ALKPHOS 65 58  BILITOT 0.3 0.5  PROT 6.6 6.3*  ALBUMIN 3.4* 3.2*   No results for input(s): LIPASE, AMYLASE in the last 168 hours. No results for input(s): AMMONIA in the last 168 hours. Coagulation Profile:  Recent Labs Lab 12/08/15 1751 12/10/15 0431  INR 1.07 1.30   Cardiac Enzymes:  Recent Labs Lab 12/08/15 1436  TROPONINI <0.03   BNP (last 3 results) No results for input(s): PROBNP in the last 8760 hours.  CBG: No results for input(s): GLUCAP in the last 168 hours.  Studies: No results found.   Scheduled Meds: . Aclidinium Bromide  1 puff Inhalation Daily  . apixaban  5 mg Oral BID  . diltiazem  30 mg Oral Q6H  . guaiFENesin  600 mg Oral BID  . levofloxacin  750 mg Oral Q24H  . mometasone-formoterol  2 puff Inhalation BID  . polyethylene glycol  17 g Oral Daily  . predniSONE  50 mg Oral Q breakfast  . rOPINIRole  0.25 mg Oral QHS   Continuous Infusions: . amiodarone 30 mg/hr (12/11/15 1325)   PRN Meds: albuterol, ipratropium, levalbuterol, senna-docusate  Time spent: 30 minutes  Author: Berle Mull, MD [facility] Pager: [phone] 12/11/2015 5:38 PM  If 7PM-7AM, please contact night-coverage at [email or web address], password  [facility]

## 2015-12-11 NOTE — Progress Notes (Addendum)
Primary Cardiologist: Carlyle Dolly MD  Cardiology Specific Problem List: 1. New Onset Aflutter  Subjective:    Still having trouble with heart racing. Breathing is improved.   Objective:   Temp:  [97.4 F (36.3 C)-97.7 F (36.5 C)] 97.4 F (36.3 C) (09/19 0750) Pulse Rate:  [49-113] 74 (09/19 0600) Resp:  [14-25] 17 (09/19 0600) BP: (97-123)/(61-83) 102/71 (09/19 0600) SpO2:  [92 %-99 %] 98 % (09/19 0600) Weight:  [190 lb 14.7 oz (86.6 kg)] 190 lb 14.7 oz (86.6 kg) (09/19 0500) Last BM Date: 12/08/15 (Per pt)  Filed Weights   12/09/15 0500 12/10/15 0500 12/11/15 0500  Weight: 191 lb 9.3 oz (86.9 kg) 190 lb 14.7 oz (86.6 kg) 190 lb 14.7 oz (86.6 kg)    Intake/Output Summary (Last 24 hours) at 12/11/15 0814 Last data filed at 12/11/15 0600  Gross per 24 hour  Intake          1866.16 ml  Output             2500 ml  Net          -633.84 ml    Telemetry: Atrial fib with RVR, rates into the 120's transiently.   Exam:  General: No acute distress.  HEENT: Conjunctiva and lids normal, oropharynx clear.  Lungs: Clear to auscultation, nonlabored.  Cardiac: No elevated JVP or bruits. IRRR, distant, no gallop or rub.   Abdomen: Normoactive bowel sounds, nontender, nondistended.  Extremities: No pitting edema, distal pulses full.  Neuropsychiatric: Alert and oriented x3, affect appropriate. Tremors.    Lab Results:  Basic Metabolic Panel:  Recent Labs Lab 12/09/15 0356 12/10/15 0431 12/11/15 0431  NA 135 134* 134*  K 4.2 4.5 4.7  CL 96* 93* 91*  CO2 38* 37* 37*  GLUCOSE 96 165* 156*  BUN 10 11 15   CREATININE 0.73 0.58* 0.75  CALCIUM 8.0* 8.9 8.9  MG  --  2.1 2.1    Liver Function Tests:  Recent Labs Lab 12/10/15 0431 12/11/15 0431  AST 17 18  ALT 18 20  ALKPHOS 65 58  BILITOT 0.3 0.5  PROT 6.6 6.3*  ALBUMIN 3.4* 3.2*    CBC:  Recent Labs Lab 12/08/15 1436 12/09/15 0356 12/10/15 0431  WBC 13.5* 11.4* 9.1  HGB 13.1 11.1* 12.0*   HCT 42.1 36.1* 38.8*  MCV 101.0* 102.8* 100.5*  PLT 180 177 177    Cardiac Enzymes:  Recent Labs Lab 12/08/15 1436  TROPONINI <0.03     Coagulation:  Recent Labs Lab 12/08/15 1751 12/10/15 0431  INR 1.07 1.30   Echocardiogram Left ventricle: The cavity size was normal. Wall thickness was   normal. Systolic function was normal. The estimated ejection   fraction was in the range of 55% to 60%. Although no diagnostic   regional wall motion abnormality was identified, this possibility   cannot be completely excluded on the basis of this study. - Ventricular septum: Septal motion showed abnormal function,   dyssynergy, and paradox. The contour showed systolic flattening.   These changes are consistent with RV pressure overload.  - The systolic PA pressure could not be estimated due to the   absence of TR jet. The 2D findings are suspicious for severely   elevated PA pressure.      Medications:   Scheduled Medications: . Aclidinium Bromide  1 puff Inhalation Daily  . apixaban  5 mg Oral BID  . diltiazem  15 mg Oral Once  . diltiazem  45 mg  Oral Q6H  . guaiFENesin  600 mg Oral BID  . levofloxacin  750 mg Oral Q24H  . methylPREDNISolone (SOLU-MEDROL) injection  60 mg Intravenous Q12H  . mometasone-formoterol  2 puff Inhalation BID  . rOPINIRole  0.25 mg Oral QHS       PRN Medications:  albuterol, ipratropium, levalbuterol   Assessment and Plan:   1.Atrial fib with RVR: Continuing to have difficulty with rate control on transition to oral diltiazem. Now on 45 mg Q 6 hours. BP is soft.Heart rate may  be related to steroids and infection. Had diltiazem gtt restarted last evening due to HR elevation.  Heart rate at lowest recording in the 90's. Will discuss with Dr. Harl Bowie starting amiodarone gtt with loading.   2. COPD with Pneumonia: Continued on steroids and antibiotics.          Phill Myron. Lawrence NP [place]  12/11/2015, 8:14 AM   Attending  Note Patient admitted with new onset aflutter with RVR in setting of COPD exacerbation and pneumonia. Rate controlled with dilt gtt, were not able to wean dilt drip yesterday. Remains on IV dilt, oral dilt increased to 45mg  po q6 hrs (has not yet received increased oral dose). Borderline soft bp's on current dilt dosing, heart rates not controlled. We will load IV amiodarone today, after 24 hr load convert to oral tomorrow. Plan would be for 6 weeks of therapy given his lung disease CHADS2Vasc score of 1, in discussions with patient and family we have elected for anticoagulation with eliquis. Since starting amio will decrease his oral dilt back to 30mg  po q6hrs.    Zandra Abts MD

## 2015-12-12 DIAGNOSIS — J189 Pneumonia, unspecified organism: Secondary | ICD-10-CM

## 2015-12-12 DIAGNOSIS — J449 Chronic obstructive pulmonary disease, unspecified: Secondary | ICD-10-CM

## 2015-12-12 DIAGNOSIS — J9611 Chronic respiratory failure with hypoxia: Secondary | ICD-10-CM

## 2015-12-12 LAB — BASIC METABOLIC PANEL
Anion gap: 5 (ref 5–15)
BUN: 15 mg/dL (ref 6–20)
CALCIUM: 8.7 mg/dL — AB (ref 8.9–10.3)
CO2: 36 mmol/L — AB (ref 22–32)
CREATININE: 0.67 mg/dL (ref 0.61–1.24)
Chloride: 93 mmol/L — ABNORMAL LOW (ref 101–111)
GFR calc Af Amer: >60 mL/min (ref >60)
GFR calc non Af Amer: >60 mL/min (ref >60)
GLUCOSE: 130 mg/dL — AB (ref 65–99)
Potassium: 3.8 mmol/L (ref 3.5–5.1)
Sodium: 134 mmol/L — ABNORMAL LOW (ref 135–145)

## 2015-12-12 LAB — MAGNESIUM: Magnesium: 2.1 mg/dL (ref 1.7–2.4)

## 2015-12-12 MED ORDER — DILTIAZEM HCL 30 MG PO TABS
45.0000 mg | ORAL_TABLET | Freq: Four times a day (QID) | ORAL | Status: DC
  Administered 2015-12-12 – 2015-12-13 (×4): 45 mg via ORAL
  Filled 2015-12-12 (×4): qty 2

## 2015-12-12 NOTE — Progress Notes (Addendum)
Subjective:  Feeling a little better. Can't feel heart racing unless 140 bpm.  Objective:  Vital Signs in the last 24 hours: Temp:  [96.9 F (36.1 C)-98.2 F (36.8 C)] 97 F (36.1 C) (09/20 0400) Pulse Rate:  [25-145] 67 (09/20 0415) Resp:  [13-33] 19 (09/20 0415) BP: (93-131)/(63-102) 104/75 (09/20 0400) SpO2:  [95 %-99 %] 97 % (09/20 0415) Weight:  [194 lb 0.1 oz (88 kg)] 194 lb 0.1 oz (88 kg) (09/20 0500)  Intake/Output from previous day: 09/19 0701 - 09/20 0700 In: 2876 [P.O.:840; I.V.:2036] Out: 1025 [Urine:1025] Intake/Output from this shift: No intake/output data recorded.  Physical Exam: NECK: Without JVD, HJR, or bruit LUNGS: Decreased breath sounds but Clear anterior, posterior, lateral HEART: Irregular rate and rhythm, no murmur, gallop, rub, bruit, thrill, or heave EXTREMITIES: plus 1-2 ankle edema,Without cyanosis, clubbing    Lab Results:  Recent Labs  12/10/15 0431  WBC 9.1  HGB 12.0*  PLT 177    Recent Labs  12/11/15 0431 12/12/15 0506  NA 134* 134*  K 4.7 3.8  CL 91* 93*  CO2 37* 36*  GLUCOSE 156* 130*  BUN 15 15  CREATININE 0.75 0.67   No results for input(s): TROPONINI in the last 72 hours.  Invalid input(s): CK, MB Hepatic Function Panel  Recent Labs  12/11/15 0431  PROT 6.3*  ALBUMIN 3.2*  AST 18  ALT 20  ALKPHOS 58  BILITOT 0.5   No results for input(s): CHOL in the last 72 hours. No results for input(s): PROTIME in the last 72 hours.    Cardiac Studies: Echocardiogram Left ventricle: The cavity size was normal. Wall thickness was   normal. Systolic function was normal. The estimated ejection   fraction was in the range of 55% to 60%. Although no diagnostic   regional wall motion abnormality was identified, this possibility   cannot be completely excluded on the basis of this study. - Ventricular septum: Septal motion showed abnormal function,   dyssynergy, and paradox. The contour showed systolic flattening.  These changes are consistent with RV pressure overload.   - The systolic PA pressure could not be estimated due to the   absence of TR jet. The 2D findings are suspicious for severely   elevated PA pressure.  Assessment/Plan:  1.Atrial flutter with RVR: Continuing to have difficulty with rate control on transition to oral diltiazem. Now on 30 mg Q 6 hours. BP is soft.Heart rate may  be related to steroids and infection. On IV Amiodaraone 30 mg/hr after load yesterday. Chadsvasc=1 but decision made to start Eliquis. HR still 115-120 bpm. Only received diltiazem to 45 mg once. Will increase to every 6 hrs. Probably switch to oral amiodarone today.  2. COPD with Pneumonia: Continued on steroids and antibiotics.      LOS: 4 days    David Stein 12/12/2015, 7:40 AM   Attending note Patient seen and discussed with PA Bonnell Public, I agree with her documentation. Continued elevated rates with aflutter, high catecholamine/sympathetic drive in setting of COPD exacerbation and pneumonia. Received amio IV 150mg  bolus x 2 yesterday, now on drip at 30mg /hr. Ok to increase dilt to 45mg  po q6 hrs. Hopefully as acute respiratory issues resolve his drive for tachcyardia will low and rates improve. Continue eliquis. With his respiratory status would not consider TEE/DCCV at this time. Continue IV amiodarone at 30mg /hr today. Likely change to oral tomorrow. If aflutter with RVR persists during this admission, if respiratory status improves could consider TEE/DCCV.  Zandra Abts MD

## 2015-12-12 NOTE — Progress Notes (Signed)
PT Cancellation Note  Patient Details Name: David Stein MRN: [id number] DOB: 10/01/1945   Cancelled Treatment:    Reason Eval/Treat Not Completed: Patient not medically ready (PT has attempted to see pt on 3 consecutive days, however he continues to have elevated HR.  Currently HR up to 138bpm.  At this point, PT will sign off due to continued elevated HR.  Please re-consult when pt is able to participate in skilled therapeutic intervention.  Thank you!)   Eustaquio Maize Zacharia Sowles, PT, DPT X: [phone]

## 2015-12-12 NOTE — Care Management Important Message (Signed)
Important Message  Patient Details  Name: David Stein MRN: [id number] Date of Birth: 1945/09/08   Medicare Important Message Given:  Yes    Sherald Barge, RN 12/12/2015, 1:49 PM

## 2015-12-12 NOTE — Progress Notes (Signed)
PROGRESS NOTE    RUDIE SERVEN  [id number] DOB: 1945-10-03 DOA: 12/08/2015 PCP: Leonides Grills, MD    Brief Narrative:  Pt. with PMH of COPD, chronic respiratory failure on 2.5 L of oxygen, chronic prednisone, restless leg syndrome; admitted on 12/08/2015, with complaint of shortness of breath, was found to have A. fib with RVR. Currently further plan is to transition to PO diltiazem and start IV amiodarone and monitor in the step down unit.   Assessment & Plan:   Principal Problem:   Atrial flutter with rapid ventricular response (HCC) Active Problems:   CAP (community acquired pneumonia)   Chronic respiratory failure (HCC)   Atrial flutter with rapid ventricular response ([place]) We'll continue to monitor the patient in the step down unit. Cardiology consulted and appreciate their recommendations Patient's CHA2DS2-VASc score is 1,  patient was started on Apixaban in the ER. Echocardiogram suggestive of 60-65% EF. no wall motion abnormality. Cardizem 45mg  PO q6hr ordered Amiodarone IV 30mg /hr (loading dose given yesterday)  Community-acquired pneumonia. X-[place] shows left basal infiltrate.  with worsening respiratory distress, fever ongoing for one week patient is suspected to have pneumonia. Patient is on Levaquin which will finish 5 days treatment course (currently on day 4)  History of COPD, chronic respiratory failure. Patient is a 2.5 L of oxygen. Currently appears to be in distress more than his usual. Prednisone 50mg  daily Continue Xopenex nebs, Dulera, Atrovent, Albuterol, Aclidinium Bromide   DVT prophylaxis: Therapeutic anticoagulation Code Status: Full Code Family Communication:  Disposition Plan: discharge home when Atrial fibrillation controlled with oral pharmacotherapy per cardiology recommendations   Consultants:   Cardiology  Procedures:   Echocardiogram  Antimicrobials:   Azithromycin 9/16  Rocephin 9/16  Levofloxacin 9/16=>     Subjective: Patient is sitting up eating breakfast at time of exam.  Voices that he is feeling slightly better than yesterday.  Mentions he feels his heart rate start to race at around 140bpm but only reported feeling that last evening when he ate dinner.  Denies that he felt that when eating breakfast today and so believes he is improving.  Did get up to the chair for about 3 hours yesterday and was able to ambulate to the bathroom.  Asked appropriate questions about heart rate control options if amiodarone, which was started yesterday, does not work.  Objective: Vitals:   12/12/15 0345 12/12/15 0400 12/12/15 0415 12/12/15 0500  BP:  104/75    Pulse: 64 (!) 59 67   Resp: (!) 23 (!) 21 19   Temp:  97 F (36.1 C)    TempSrc:  Oral    SpO2: 98% 97% 97%   Weight:    88 kg (194 lb 0.1 oz)  Height:        Intake/Output Summary (Last 24 hours) at 12/12/15 0748 Last data filed at 12/12/15 0000  Gross per 24 hour  Intake          2876.03 ml  Output             1025 ml  Net          1851.03 ml   Filed Weights   12/10/15 0500 12/11/15 0500 12/12/15 0500  Weight: 86.6 kg (190 lb 14.7 oz) 86.6 kg (190 lb 14.7 oz) 88 kg (194 lb 0.1 oz)    Examination:  General exam: Appears calm and comfortable  Respiratory system: Bilateral wheezing noted, on 2L [place], Respiratory effort normal. Cardiovascular system: S1 & S2 heard, irregularly irregular rhythm,  No JVD, murmurs, rubs, gallops or clicks. 2+ pedal edema. Gastrointestinal system: Abdomen is nondistended, soft and nontender. No organomegaly or masses felt. Normal bowel sounds heard. Central nervous system: Alert and oriented. No focal neurological deficits. Extremities: Symmetric 5 x 5 power. Skin: No rashes, lesions or ulcers Psychiatry: Judgement and insight appear normal. Mood & affect appropriate.     Data Reviewed: I have personally reviewed following labs and imaging studies  CBC:  Recent Labs Lab 12/08/15 1436  12/09/15 0356 12/10/15 0431  WBC 13.5* 11.4* 9.1  NEUTROABS  --   --  8.3*  HGB 13.1 11.1* 12.0*  HCT 42.1 36.1* 38.8*  MCV 101.0* 102.8* 100.5*  PLT 180 177 123XX123   Basic Metabolic Panel:  Recent Labs Lab 12/08/15 1436 12/09/15 0356 12/10/15 0431 12/11/15 0431 12/12/15 0506  NA 135 135 134* 134* 134*  K 3.9 4.2 4.5 4.7 3.8  CL 90* 96* 93* 91* 93*  CO2 38* 38* 37* 37* 36*  GLUCOSE 103* 96 165* 156* 130*  BUN 16 10 11 15 15   CREATININE 0.85 0.73 0.58* 0.75 0.67  CALCIUM 9.2 8.0* 8.9 8.9 8.7*  MG  --   --  2.1 2.1 2.1   GFR: Estimated Creatinine Clearance: 99.9 mL/min (by C-G formula based on SCr of 0.67 mg/dL). Liver Function Tests:  Recent Labs Lab 12/10/15 0431 12/11/15 0431  AST 17 18  ALT 18 20  ALKPHOS 65 58  BILITOT 0.3 0.5  PROT 6.6 6.3*  ALBUMIN 3.4* 3.2*   No results for input(s): LIPASE, AMYLASE in the last 168 hours. No results for input(s): AMMONIA in the last 168 hours. Coagulation Profile:  Recent Labs Lab 12/08/15 1751 12/10/15 0431  INR 1.07 1.30   Cardiac Enzymes:  Recent Labs Lab 12/08/15 1436  TROPONINI <0.03   BNP (last 3 results) No results for input(s): PROBNP in the last 8760 hours. HbA1C: No results for input(s): HGBA1C in the last 72 hours. CBG: No results for input(s): GLUCAP in the last 168 hours. Lipid Profile: No results for input(s): CHOL, HDL, LDLCALC, TRIG, CHOLHDL, LDLDIRECT in the last 72 hours. Thyroid Function Tests: No results for input(s): TSH, T4TOTAL, FREET4, T3FREE, THYROIDAB in the last 72 hours. Anemia Panel: No results for input(s): VITAMINB12, FOLATE, FERRITIN, TIBC, IRON, RETICCTPCT in the last 72 hours. Sepsis Labs:  Recent Labs Lab 12/08/15 1555  LATICACIDVEN 1.1    Recent Results (from the past 240 hour(s))  MRSA PCR Screening     Status: None   Collection Time: 12/08/15  6:00 PM  Result Value Ref Range Status   MRSA by PCR NEGATIVE NEGATIVE Final    Comment:        The GeneXpert MRSA  Assay (FDA approved for NASAL specimens only), is one component of a comprehensive MRSA colonization surveillance program. It is not intended to diagnose MRSA infection nor to guide or monitor treatment for MRSA infections.          Radiology Studies: No results found.      Scheduled Meds: . Aclidinium Bromide  1 puff Inhalation Daily  . apixaban  5 mg Oral BID  . diltiazem  30 mg Oral Q6H  . guaiFENesin  600 mg Oral BID  . levofloxacin  750 mg Oral Q24H  . mometasone-formoterol  2 puff Inhalation BID  . polyethylene glycol  17 g Oral Daily  . predniSONE  50 mg Oral Q breakfast  . rOPINIRole  0.25 mg Oral QHS   Continuous Infusions: .  amiodarone 30 mg/hr (12/12/15 0338)     LOS: 4 days    Time spent: 40 minutes    Newman Pies, MD [facility] Pager [phone]  If 7PM-7AM, please contact night-coverage [email or web address] Password [facility] 12/12/2015, 7:48 AM

## 2015-12-12 NOTE — Care Management Note (Signed)
Case Management Note  Patient Details  Name: David Stein MRN: [id number] Date of Birth: 18-Apr-1945  Subjective/Objective:                  Pt admitted with new onset A-fib. She is from home, lives with his wife and is ind with ADL's. He has home oxygen PTA. He states he drives himself to appointments, has no difficulty affording medications and has PCP. He plans to return home at DC. PT eval pending HR control.   Action/Plan: Will cont to follow for DC planning, will re-visit after PT eval.   Expected Discharge Date:    12/14/2015              Expected Discharge Plan:  Home/Self Care  In-House Referral:  NA  Discharge planning Services  CM Consult  Post Acute Care Choice:  NA Choice offered to:  NA  DME Arranged:    DME Agency:     HH Arranged:    HH Agency:     Status of Service:  In process, will continue to follow  If discussed at Long Length of Stay Meetings, dates discussed:    Additional Comments:  Sherald Barge, RN 12/12/2015, 1:46 PM

## 2015-12-13 MED ORDER — AMIODARONE HCL 200 MG PO TABS: 200.0000 mg | ORAL_TABLET | Freq: Two times a day (BID) | ORAL | Status: DC

## 2015-12-13 MED ORDER — AMIODARONE HCL 200 MG PO TABS
400.0000 mg | ORAL_TABLET | Freq: Two times a day (BID) | ORAL | Status: DC
  Administered 2015-12-13 – 2015-12-14 (×3): 400 mg via ORAL
  Filled 2015-12-13 (×3): qty 2

## 2015-12-13 MED ORDER — DILTIAZEM HCL ER COATED BEADS 180 MG PO CP24
180.0000 mg | ORAL_CAPSULE | Freq: Every day | ORAL | Status: DC
  Administered 2015-12-13: 180 mg via ORAL
  Filled 2015-12-13: qty 1

## 2015-12-13 NOTE — Progress Notes (Signed)
PROGRESS NOTE    David Stein  [id number] DOB: 1946/02/26 DOA: 12/08/2015 PCP: Leonides Grills, MD    Brief Narrative:  Pt. with PMH of COPD, chronic respiratory failure on 2.5 L of oxygen, chronic prednisone, restless leg syndrome; admitted on 12/08/2015, with complaint of shortness of breath, was found to have A. fib with RVR. Currently further plan is to transition to PO diltiazem and start IV amiodarone and monitor in the step down unit.   Assessment & Plan:   Principal Problem:   Atrial flutter with rapid ventricular response (HCC) Active Problems:   CAP (community acquired pneumonia)   Chronic respiratory failure (HCC)   Atrial flutter with rapid ventricular response ([place]) We'll continue to monitor the patient in the step down unit. Cardiology consulted and appreciate their recommendations Patient's CHA2DS2-VASc score is 1,  patient was started on Apixaban in the ER. Echocardiogram suggestive of 60-65% EF. no wall motion abnormality. Cardizem 45mg  PO q6hr yesterday and will change to 200mg  BID this morning Amiodarone IV 30mg /hr (loading dose) Will await cardiology recommendations concerning TEE  Community-acquired pneumonia. X-[place] shows left basal infiltrate.  with worsening respiratory distress, fever ongoing for one week patient is suspected to have pneumonia. Patient is on Levaquin which will finish 5 days treatment course (currently on day 5)  History of COPD, chronic respiratory failure. Patient is a 2.5 L of oxygen. Prednisone 50mg  daily Continue Xopenex nebs, Dulera, Atrovent, Albuterol, Aclidinium Bromide Breathing status improved Albuterol, xopenex and Atrovent ordered PRN and patient has not required any administration   DVT prophylaxis: Therapeutic anticoagulation Code Status: Full Code Family Communication: no family is bedside at time of interview Disposition Plan: discharge home when Atrial fibrillation controlled with oral pharmacotherapy per  cardiology recommendations   Consultants:   Cardiology  Procedures:   Echocardiogram  Antimicrobials:   Azithromycin 9/16  Rocephin 9/16  Levofloxacin 9/16=> 9/21    Subjective: Heart rate better controlled overnight with amiodarone and diltiazem.  Patient states his lungs and breathing feel better today than they have in a long time.  He voices that he feels like he is moving in the right direction.  Did report feeling some heart fluttering yesterday when he went and sat in the chair for a bit and that the fluttering improved with getting back in bed.  He is currently NPO pending determination of TEE.  No dizziness/ lightheadness, no chest pain, no increased work of breathing, no abdominal pain, no dysuria or constipation.  Heart rate while I was in room was in the mid 90s.  Objective: Vitals:   12/13/15 0630 12/13/15 0645 12/13/15 0723 12/13/15 0742  BP: 107/75     Pulse: (!) 43 (!) 58 (!) 43   Resp: 17 (!) 21 (!) 22   Temp:    97.6 F (36.4 C)  TempSrc:    Oral  SpO2: 99% 97% 99%   Weight:      Height:        Intake/Output Summary (Last 24 hours) at 12/13/15 0758 Last data filed at 12/13/15 0723  Gross per 24 hour  Intake          1357.02 ml  Output              800 ml  Net           557.02 ml   Filed Weights   12/11/15 0500 12/12/15 0500 12/13/15 0500  Weight: 86.6 kg (190 lb 14.7 oz) 88 kg (194 lb 0.1 oz) 89.7 kg (  197 lb 12 oz)    Examination:  General exam: Appears calm and comfortable  Respiratory system: Good aeration in lung fields bilaterally, minimal wheezing noted on exam (on expiration only), on 2L [place], Respiratory effort normal. Cardiovascular system: S1 & S2 heard, irregularly irregular rhythm,  No JVD, murmurs, rubs, gallops or clicks. 2+ pedal edema to ankles bilaterally. Gastrointestinal system: Abdomen is nondistended, soft and nontender. No organomegaly or masses felt. Normal bowel sounds heard. Central nervous system: Alert and oriented. No  focal neurological deficits. Extremities: Symmetric 5 x 5 power. Skin: No rashes, lesions or ulcers Psychiatry: Judgement and insight appear normal. Mood & affect appropriate.     Data Reviewed: I have personally reviewed following labs and imaging studies  CBC:  Recent Labs Lab 12/08/15 1436 12/09/15 0356 12/10/15 0431  WBC 13.5* 11.4* 9.1  NEUTROABS  --   --  8.3*  HGB 13.1 11.1* 12.0*  HCT 42.1 36.1* 38.8*  MCV 101.0* 102.8* 100.5*  PLT 180 177 123XX123   Basic Metabolic Panel:  Recent Labs Lab 12/08/15 1436 12/09/15 0356 12/10/15 0431 12/11/15 0431 12/12/15 0506  NA 135 135 134* 134* 134*  K 3.9 4.2 4.5 4.7 3.8  CL 90* 96* 93* 91* 93*  CO2 38* 38* 37* 37* 36*  GLUCOSE 103* 96 165* 156* 130*  BUN 16 10 11 15 15   CREATININE 0.85 0.73 0.58* 0.75 0.67  CALCIUM 9.2 8.0* 8.9 8.9 8.7*  MG  --   --  2.1 2.1 2.1   GFR: Estimated Creatinine Clearance: 99.9 mL/min (by C-G formula based on SCr of 0.67 mg/dL). Liver Function Tests:  Recent Labs Lab 12/10/15 0431 12/11/15 0431  AST 17 18  ALT 18 20  ALKPHOS 65 58  BILITOT 0.3 0.5  PROT 6.6 6.3*  ALBUMIN 3.4* 3.2*   No results for input(s): LIPASE, AMYLASE in the last 168 hours. No results for input(s): AMMONIA in the last 168 hours. Coagulation Profile:  Recent Labs Lab 12/08/15 1751 12/10/15 0431  INR 1.07 1.30   Cardiac Enzymes:  Recent Labs Lab 12/08/15 1436  TROPONINI <0.03   BNP (last 3 results) No results for input(s): PROBNP in the last 8760 hours. HbA1C: No results for input(s): HGBA1C in the last 72 hours. CBG: No results for input(s): GLUCAP in the last 168 hours. Lipid Profile: No results for input(s): CHOL, HDL, LDLCALC, TRIG, CHOLHDL, LDLDIRECT in the last 72 hours. Thyroid Function Tests: No results for input(s): TSH, T4TOTAL, FREET4, T3FREE, THYROIDAB in the last 72 hours. Anemia Panel: No results for input(s): VITAMINB12, FOLATE, FERRITIN, TIBC, IRON, RETICCTPCT in the last 72  hours. Sepsis Labs:  Recent Labs Lab 12/08/15 1555  LATICACIDVEN 1.1    Recent Results (from the past 240 hour(s))  MRSA PCR Screening     Status: None   Collection Time: 12/08/15  6:00 PM  Result Value Ref Range Status   MRSA by PCR NEGATIVE NEGATIVE Final    Comment:        The GeneXpert MRSA Assay (FDA approved for NASAL specimens only), is one component of a comprehensive MRSA colonization surveillance program. It is not intended to diagnose MRSA infection nor to guide or monitor treatment for MRSA infections.          Radiology Studies: No results found.      Scheduled Meds: . Aclidinium Bromide  1 puff Inhalation Daily  . apixaban  5 mg Oral BID  . diltiazem  45 mg Oral Q6H  . guaiFENesin  600 mg Oral BID  . levofloxacin  750 mg Oral Q24H  . mometasone-formoterol  2 puff Inhalation BID  . polyethylene glycol  17 g Oral Daily  . predniSONE  50 mg Oral Q breakfast  . rOPINIRole  0.25 mg Oral QHS   Continuous Infusions: . amiodarone 30 mg/hr (12/13/15 0723)     LOS: 5 days    Time spent: 30 minutes    Newman Pies, MD [facility] Pager [phone]  If 7PM-7AM, please contact night-coverage [email or web address] Password [email or web address] 12/13/2015, 7:58 AM

## 2015-12-13 NOTE — Consult Note (Signed)
   [facility] CM Inpatient Consult   12/13/2015  David Stein [birth year] [id number]   Spoke with patient and family at bedside regarding [facility] services. Patient does not want to participate with [facility] at this time, however, he may decide to sign up for services in the future. Patient given [facility] brochure and contact information for future reference, voices appreciation of information.    Inpatient case manager aware that patient offered [facility] case management services but declined.   Of note, [facility] Care Management services would not replace or interfere with any services that are arranged by inpatient case management or social work. For additional questions or referrals please contact:   Royetta Crochet. Laymond Purser, RN, BSN, [facility] Liaison [phone]

## 2015-12-13 NOTE — Progress Notes (Addendum)
Primary Cardiologist: Carlyle Dolly MD  Cardiology Specific Problem List: 1. Atrial fib with RVR  Subjective:    Feeling better, breathing better. Remains on IV amiodarone loading.   Objective:   Temp:  [96 F (35.6 C)-98.1 F (36.7 C)] 97.6 F (36.4 C) (09/21 0742) Pulse Rate:  [31-140] 43 (09/21 0723) Resp:  [10-27] 22 (09/21 0723) BP: (98-136)/(70-90) 107/75 (09/21 0630) SpO2:  [95 %-99 %] 99 % (09/21 0723) Weight:  [197 lb 12 oz (89.7 kg)] 197 lb 12 oz (89.7 kg) (09/21 0500) Last BM Date: 12/11/15  Filed Weights   12/11/15 0500 12/12/15 0500 12/13/15 0500  Weight: 190 lb 14.7 oz (86.6 kg) 194 lb 0.1 oz (88 kg) 197 lb 12 oz (89.7 kg)    Intake/Output Summary (Last 24 hours) at 12/13/15 0759 Last data filed at 12/13/15 0723  Gross per 24 hour  Intake          1357.02 ml  Output              800 ml  Net           557.02 ml    Telemetry: Atrial fib/flutter rates in the 60's to 70's.   Exam:  General: No acute distress.  HEENT: Conjunctiva and lids normal, oropharynx clear.  Lungs: Clear to auscultation, nonlabored.  Cardiac: No elevated JVP or bruits. IRRR,  Distant heart sounds, no gallop or rub.   Abdomen: Normoactive bowel sounds, nontender, nondistended.  Extremities: No pitting edema, distal pulses full.  Neuropsychiatric: Alert and oriented x3, affect appropriate.   Lab Results:  Basic Metabolic Panel:  Recent Labs Lab 12/10/15 0431 12/11/15 0431 12/12/15 0506  NA 134* 134* 134*  K 4.5 4.7 3.8  CL 93* 91* 93*  CO2 37* 37* 36*  GLUCOSE 165* 156* 130*  BUN 11 15 15   CREATININE 0.58* 0.75 0.67  CALCIUM 8.9 8.9 8.7*  MG 2.1 2.1 2.1    Liver Function Tests:  Recent Labs Lab 12/10/15 0431 12/11/15 0431  AST 17 18  ALT 18 20  ALKPHOS 65 58  BILITOT 0.3 0.5  PROT 6.6 6.3*  ALBUMIN 3.4* 3.2*    CBC:  Recent Labs Lab 12/08/15 1436 12/09/15 0356 12/10/15 0431  WBC 13.5* 11.4* 9.1  HGB 13.1 11.1* 12.0*  HCT 42.1  36.1* 38.8*  MCV 101.0* 102.8* 100.5*  PLT 180 177 177    Cardiac Enzymes:  Recent Labs Lab 12/08/15 1436  TROPONINI <0.03    Coagulation:  Recent Labs Lab 12/08/15 1751 12/10/15 0431  INR 1.07 1.30    Medications:   Scheduled Medications: . Aclidinium Bromide  1 puff Inhalation Daily  . apixaban  5 mg Oral BID  . diltiazem  45 mg Oral Q6H  . guaiFENesin  600 mg Oral BID  . levofloxacin  750 mg Oral Q24H  . mometasone-formoterol  2 puff Inhalation BID  . polyethylene glycol  17 g Oral Daily  . predniSONE  50 mg Oral Q breakfast  . rOPINIRole  0.25 mg Oral QHS     Infusions: . amiodarone 30 mg/hr (12/13/15 0723)     PRN Medications:  albuterol, ipratropium, levalbuterol, senna-docusate   Assessment and Plan:   1. Atrial flutter: Rates are better controlled on IV amiodarone gtt currently at 30 mg/hour and diltiazem at 45 mg Q 6 hours.. Will transition to 400 mg BID this am  On Eliquis with CHADS VASC Score of 1. May not need TEE DCCV now. Still NPO.  Will discuss with Dr. Harl Bowie need to proceed.  2. Pneumonia with COPD exacerbation: Breathing status is improved with IV antibiotics.   Phill Myron. Lawrence NP [place]  12/13/2015, 7:59 AM   Attending Note Patient seen and discussed with NP Purcell Nails, I agree with her documentation above. Remains in aflutter, rates much improved from yesterday now 70s-90s. He remains asymptomatic. We will change amio IV to 400mg  po bid x 7 days, then 200mg  bid for 3 weeks. Plan on 6 weeks of therapy given his chronic lung disease. With rate control, lack of symptoms will not pursue TEE cardoversion as this time. With his COPD exacerbation and pneumonia there would be somewhat higher risk with sedation, and in the absence of symptoms or persistent tachcyardia I would not pursue at this time. Can consider DCCV after 3 weeks of anticoagulation. His rates have improved as his pneumonia and COPD have improved, I suspect as these continue to  resolve and he is continued on amio there is a good chance he will chemically convert. We will resume diet this AM. Would recommend monitoring one more day prior to discharge. Convert dilt to long acting 180mg .   Zandra Abts MD

## 2015-12-14 MED ORDER — DILTIAZEM HCL ER COATED BEADS 240 MG PO CP24: 240.0000 mg | ORAL_CAPSULE | Freq: Every day | ORAL | Status: DC

## 2015-12-14 MED ORDER — METOPROLOL TARTRATE 5 MG/5ML IV SOLN
5.0000 mg | Freq: Once | INTRAVENOUS | Status: AC
  Administered 2015-12-14: 5 mg via INTRAVENOUS
  Filled 2015-12-14: qty 5

## 2015-12-14 MED ORDER — APIXABAN 5 MG PO TABS: 5.0000 mg | ORAL_TABLET | Freq: Two times a day (BID) | ORAL | 0 refills | Status: DC

## 2015-12-14 MED ORDER — ACETAMINOPHEN 325 MG PO TABS
650.0000 mg | ORAL_TABLET | Freq: Four times a day (QID) | ORAL | Status: DC | PRN
  Administered 2015-12-14: 650 mg via ORAL
  Filled 2015-12-14: qty 2

## 2015-12-14 MED ORDER — SENNOSIDES-DOCUSATE SODIUM 8.6-50 MG PO TABS: 2.0000 | ORAL_TABLET | Freq: Every evening | ORAL | Status: DC | PRN

## 2015-12-14 MED ORDER — DILTIAZEM HCL ER COATED BEADS 180 MG PO CP24: 180.0000 mg | ORAL_CAPSULE | Freq: Every day | ORAL | 0 refills | Status: DC

## 2015-12-14 MED ORDER — ROPINIROLE HCL 0.25 MG PO TABS: 0.2500 mg | ORAL_TABLET | Freq: Every day | ORAL | 0 refills | Status: DC

## 2015-12-14 MED ORDER — AMIODARONE HCL 400 MG PO TABS: 400.0000 mg | ORAL_TABLET | Freq: Two times a day (BID) | ORAL | 0 refills | Status: DC

## 2015-12-14 MED ORDER — GUAIFENESIN ER 600 MG PO TB12: 600.0000 mg | ORAL_TABLET | Freq: Two times a day (BID) | ORAL | Status: DC

## 2015-12-14 MED ORDER — DILTIAZEM HCL ER COATED BEADS 180 MG PO CP24
180.0000 mg | ORAL_CAPSULE | Freq: Every day | ORAL | Status: DC
  Administered 2015-12-14: 180 mg via ORAL
  Filled 2015-12-14: qty 1

## 2015-12-14 MED ORDER — FUROSEMIDE 10 MG/ML IJ SOLN
40.0000 mg | Freq: Once | INTRAMUSCULAR | Status: AC
  Administered 2015-12-14: 40 mg via INTRAVENOUS
  Filled 2015-12-14: qty 4

## 2015-12-14 MED ORDER — PREDNISONE 20 MG PO TABS: 40.0000 mg | ORAL_TABLET | Freq: Every day | ORAL | 0 refills | Status: AC

## 2015-12-14 MED ORDER — DIGOXIN 0.25 MG/ML IJ SOLN
0.5000 mg | Freq: Once | INTRAMUSCULAR | Status: AC
  Administered 2015-12-14: 0.5 mg via INTRAVENOUS
  Filled 2015-12-14: qty 2

## 2015-12-14 NOTE — Progress Notes (Signed)
Rates remain elevated. He is on amio and oral dilt, soft bp's. Will give digoxin 0.5mg  IV x1 this afternoon and monitor rates.   Zandra Abts MD

## 2015-12-14 NOTE — Discharge Summary (Signed)
Physician Discharge Summary  David Stein [id number] DOB: Jun 18, 1945 DOA: 12/08/2015  PCP: Leonides Grills, MD  Admit date: 12/08/2015 Discharge date: 12/14/2015  Admitted From: Home Disposition:  Home  Recommendations for Outpatient Follow-up:  1. Follow up with PCP in 1-2 weeks 2. Follow up with Dr. Nelly Laurence office at scheduled appointment 3. Continue all new medications  Home Health: No Equipment/Devices: Oxygen at 2.5L [place] (patient previously on this)  Discharge Condition:Stable and improved CODE STATUS:Full Code Diet recommendation: Heart Healthy / Carb Modified / Regular / Dysphagia   Brief/Interim Summary: Pt. with PMH of COPD, chronic respiratory failure on 2.5 L of oxygen, chronic prednisone, restless leg syndrome; admitted on 12/08/2015, with complaint of shortness of breath, was found to have A. fib with RVR. Patient was placed in step down unit after admission and started on IV diltiazem.  That was then transitioned to PO and amiodarone loading was done via IV.  This controlled patient's heartrate and he was ultimately transitioned to PO amiodarone in addition to PO dilitiazem.  Patient was seen and followed by cardiology during this admission who made adjustments to his medication to control his heart rate.  For patient's COPD and pneumonia patient was placed on a higher dose of steroids and treated with antibiotics.  Antibiotics for pneumonia were discontinued after 5 days of treatment.  He was stable for discharge on 9/22 and he was instructed to follow up with cardiology.  He was sent home with a steroid taper.  Discharge Diagnoses:  Principal Problem:   Atrial flutter with rapid ventricular response (HCC) Active Problems:   CAP (community acquired pneumonia)   Chronic respiratory failure [facility])    Discharge Instructions  Discharge Instructions    Call MD for:  difficulty breathing, headache or visual disturbances    Complete by:  As directed    Call MD for:   persistant dizziness or light-headedness    Complete by:  As directed    Diet - low sodium heart healthy    Complete by:  As directed    Increase activity slowly    Complete by:  As directed        Medication List    STOP taking these medications   ibuprofen 200 MG tablet Commonly known as:  ADVIL,MOTRIN   sulfamethoxazole-trimethoprim 800-160 MG tablet Commonly known as:  BACTRIM DS,SEPTRA DS     TAKE these medications   albuterol (2.5 MG/3ML) 0.083% nebulizer solution Commonly known as:  PROVENTIL Take 2.5 mg by nebulization every 6 (six) hours as needed for wheezing or shortness of breath.   PROAIR HFA 108 (90 Base) MCG/ACT inhaler Generic drug:  albuterol Inhale 1-2 puffs into the lungs every 6 (six) hours as needed for wheezing or shortness of breath.   amiodarone 400 MG tablet Commonly known as:  PACERONE Take 1 tablet (400 mg total) by mouth 2 (two) times daily.   apixaban 5 MG Tabs tablet Commonly known as:  ELIQUIS Take 1 tablet (5 mg total) by mouth 2 (two) times daily.   budesonide-formoterol 160-4.5 MCG/ACT inhaler Commonly known as:  SYMBICORT Inhale 2 puffs into the lungs 2 (two) times daily.   diltiazem 180 MG 24 hr capsule Commonly known as:  CARDIZEM CD Take 1 capsule (180 mg total) by mouth daily. Start taking on:  12/15/2015   guaiFENesin 600 MG 12 hr tablet Commonly known as:  MUCINEX Take 1 tablet (600 mg total) by mouth 2 (two) times daily.   multivitamin with minerals Tabs tablet  Take 1 tablet by mouth daily.   predniSONE 20 MG tablet Commonly known as:  DELTASONE Take 2 tablets (40 mg total) by mouth daily. 2 tabs PO qAM x 4 days then 1 tab PO qAM x4 days then half a tablet PO qAM x 4 days What changed:  medication strength  how much to take  when to take this  reasons to take this  additional instructions   rOPINIRole 0.25 MG tablet Commonly known as:  REQUIP Take 1 tablet (0.25 mg total) by mouth at bedtime.    senna-docusate 8.6-50 MG tablet Commonly known as:  Senokot-S Take 2 tablets by mouth at bedtime as needed for mild constipation.   TUDORZA PRESSAIR 400 MCG/ACT Aepb Generic drug:  Aclidinium Bromide Inhale 1 puff into the lungs daily.      Follow-up Information    Carlyle Dolly, MD .   Specialty:  Cardiology Why:  office to call with follow up appt.  5-7 days after discharge. Contact information: [address] [place]  [address] [phone]          Allergies  Allergen Reactions  . Codeine Other (See Comments)    headache    Consultations:  Cardiology  Physical Therapy   Procedures/Studies: Dg Chest Port 1 View  Result Date: 12/08/2015 CLINICAL DATA:  Chest pain and tachycardia. Worsening shortness of breath over the past 5 days with bilateral peripheral edema. Oxygen dependent. COPD. EXAM: PORTABLE CHEST 1 VIEW COMPARISON:  02/08/2015 FINDINGS: Lungs are hyperinflated with minimal focal opacification in the left base just above the hemidiaphragm which may be due to atelectasis or infection. Evidence of emphysematous disease. Few small stable calcified granulomas. Cardiac silhouette is within normal. There is calcified plaque over the thoracic aorta. Remainder the exam is unchanged. IMPRESSION: Minimal focal opacification over the left base which may be due to atelectasis or infection. Aortic atherosclerosis. COPD. Electronically Signed   By: Marin Olp M.D.   On: 12/08/2015 15:11     Subjective: Patient in good spirits today.  Reports that he feels well and slept well from midnight onwards.  Ready to go home.    Discharge Exam: Vitals:   12/14/15 1530 12/14/15 1609  BP: 101/72   Pulse: (!) 131   Resp: 18   Temp:  97.3 F (36.3 C)   Vitals:   12/14/15 1430 12/14/15 1500 12/14/15 1530 12/14/15 1609  BP: 105/73 99/73 101/72   Pulse: (!) 130 78 (!) 131   Resp: (!) 24 18 18    Temp:    97.3 F (36.3 C)  TempSrc:    Oral  SpO2: 96% 97% 96%    Weight:      Height:        General: Pt is alert, awake, not in acute distress Cardiovascular: irregularly irregular Rhythm, tachycardic, S1/S2 +, no rubs, no gallops Respiratory: CTA bilaterally, no wheezing, few rhonchi noted Abdominal: Soft, NT, ND, bowel sounds + Extremities: no cyanosis, edema from ankles distally, slightly worse than previous    The results of significant diagnostics from this hospitalization (including imaging, microbiology, ancillary and laboratory) are listed below for reference.     Microbiology: Recent Results (from the past 240 hour(s))  MRSA PCR Screening     Status: None   Collection Time: 12/08/15  6:00 PM  Result Value Ref Range Status   MRSA by PCR NEGATIVE NEGATIVE Final    Comment:        The GeneXpert MRSA Assay (FDA approved for NASAL specimens  only), is one component of a comprehensive MRSA colonization surveillance program. It is not intended to diagnose MRSA infection nor to guide or monitor treatment for MRSA infections.      Labs: BNP (last 3 results)  Recent Labs  12/08/15 1436  BNP [id number]*   Basic Metabolic Panel:  Recent Labs Lab 12/08/15 1436 12/09/15 0356 12/10/15 0431 12/11/15 0431 12/12/15 0506  NA 135 135 134* 134* 134*  K 3.9 4.2 4.5 4.7 3.8  CL 90* 96* 93* 91* 93*  CO2 38* 38* 37* 37* 36*  GLUCOSE 103* 96 165* 156* 130*  BUN 16 10 11 15 15   CREATININE 0.85 0.73 0.58* 0.75 0.67  CALCIUM 9.2 8.0* 8.9 8.9 8.7*  MG  --   --  2.1 2.1 2.1   Liver Function Tests:  Recent Labs Lab 12/10/15 0431 12/11/15 0431  AST 17 18  ALT 18 20  ALKPHOS 65 58  BILITOT 0.3 0.5  PROT 6.6 6.3*  ALBUMIN 3.4* 3.2*   No results for input(s): LIPASE, AMYLASE in the last 168 hours. No results for input(s): AMMONIA in the last 168 hours. CBC:  Recent Labs Lab 12/08/15 1436 12/09/15 0356 12/10/15 0431  WBC 13.5* 11.4* 9.1  NEUTROABS  --   --  8.3*  HGB 13.1 11.1* 12.0*  HCT 42.1 36.1* 38.8*  MCV 101.0* 102.8*  100.5*  PLT 180 177 177   Cardiac Enzymes:  Recent Labs Lab 12/08/15 1436  TROPONINI <0.03   BNP: Invalid input(s): POCBNP CBG: No results for input(s): GLUCAP in the last 168 hours. D-Dimer No results for input(s): DDIMER in the last 72 hours. Hgb A1c No results for input(s): HGBA1C in the last 72 hours. Lipid Profile No results for input(s): CHOL, HDL, LDLCALC, TRIG, CHOLHDL, LDLDIRECT in the last 72 hours. Thyroid function studies No results for input(s): TSH, T4TOTAL, T3FREE, THYROIDAB in the last 72 hours.  Invalid input(s): FREET3 Anemia work up No results for input(s): VITAMINB12, FOLATE, FERRITIN, TIBC, IRON, RETICCTPCT in the last 72 hours. Urinalysis No results found for: COLORURINE, APPEARANCEUR, [place], [place], [place], [place], [place], [place], PROTEINUR, UROBILINOGEN, NITRITE, LEUKOCYTESUR Sepsis Labs Invalid input(s): PROCALCITONIN,  WBC,  LACTICIDVEN Microbiology Recent Results (from the past 240 hour(s))  MRSA PCR Screening     Status: None   Collection Time: 12/08/15  6:00 PM  Result Value Ref Range Status   MRSA by PCR NEGATIVE NEGATIVE Final    Comment:        The GeneXpert MRSA Assay (FDA approved for NASAL specimens only), is one component of a comprehensive MRSA colonization surveillance program. It is not intended to diagnose MRSA infection nor to guide or monitor treatment for MRSA infections.      Time coordinating discharge: Over 30 minutes  SIGNED:   Newman Pies, MD  [facility] 12/14/2015, 4:35 PM Pager [phone] If 7PM-7AM, please contact night-coverage [email or web address] Password [email or web address]

## 2015-12-14 NOTE — Progress Notes (Addendum)
Rates down to 90s after IV digoxin, though did increase to 130s with working with PT. He has never been symptomatic with his aflutter. He is on maximal tolerated medical therapy, and does not require further hospitalization for his asymptomatic elevated rates. [place] for discharge today, he will have close cardiology follow up. Would continue amio 400mg  bid and dilt 180mg  daily. Hope is that with amio loading and improvement in respiratory status over the next few days rates and rhythm will improve. Can reconsider DCCV at outpatient f/u, would try to avoid TEE portion if possible  unless respiratory status significantly improved at follow up. If completes 3 weeks of anticoag would not require TEE for DCCV if he remains in flutter.  Goal would be to d/c amio at 6 weeks given his chronic lung disease.    Zandra Abts MD

## 2015-12-14 NOTE — Care Management Important Message (Signed)
Important Message  Patient Details  Name: David Stein MRN: [id number] Date of Birth: 1945-06-17   Medicare Important Message Given:  Yes    Sherald Barge, RN 12/14/2015, 10:41 AM

## 2015-12-14 NOTE — Evaluation (Signed)
Physical Therapy Evaluation Patient Details Name: David Stein MRN: [id number] DOB: [birth year] Today's Date: 12/14/2015   History of Present Illness  70 y.o. male with a history of chronic respiratory failure, oxygen dependence, steroid dependence COPD, arthritis, restless leg syndrome. Patient seen with one-week history of persistent shortness of breath, worse with activity and improved with rest. Patient was placed on Bactrim by his PCP due to the respiratory symptoms as he frequently gets respiratory infections. After 3-4 days of no improvement, the patient presented here for evaluation. Dx: Afib with RVR, CAP.  PMH: Arthritis, COPD, O2 dependent on 2.5L, RLS, SOB, knee surgery.   Clinical Impression  Pt received in bed, and was agreeable to PT evaluation.  Pt expressed that he lives with his wife, and normally ambulates with a Rollator in the AM, however does not use it as he feels stronger during the day.  The furthest he ambulates is from the carport into the house.  At this time he states he is at ~85% back to his baseline, however he continues to be limited due to elevated HR.  During today's evaluation he required Min guard/supervision for all functional mobility, however his HR ranged from 100's - 134bpm during mobility.  I do not feel that he will need any HHPT upon d/c, and he agree's.      Follow Up Recommendations No PT follow up    Equipment Recommendations  None recommended by PT    Recommendations for Other Services       Precautions / Restrictions Precautions Precautions: None Restrictions Weight Bearing Restrictions: No      Mobility  Bed Mobility Overal bed mobility: Needs Assistance Bed Mobility: Supine to Sit     Supine to sit: Supervision;HOB elevated        Transfers Overall transfer level: Needs assistance Equipment used: Rolling walker (2 wheeled) Transfers: Sit to/from [employer] Transfers Sit to Stand: Supervision Stand pivot transfers:  Supervision          Ambulation/Gait Ambulation/Gait assistance: Min guard Ambulation Distance (Feet): 20 Feet Assistive device: Rolling walker (2 wheeled) Gait Pattern/deviations: Step-through pattern     General Gait Details: Pt is limited due to elevated HR up to 134 during ambulation.  RN notified.   Stairs            Wheelchair Mobility    Modified Rankin (Stroke Patients Only)       Balance Overall balance assessment: Modified Independent                                           Pertinent Vitals/Pain Pain Assessment: No/denies pain    Home Living   Living Arrangements: Spouse/significant other Available Help at Discharge: Family (2 children assist when needed. ) Type of Home: House Home Access: Stairs to enter Entrance Stairs-Rails: Left Entrance Stairs-Number of Steps: 3 Home Layout: One level Home Equipment: Transport planner;Other (comment);Walker - 4 wheels (lift chair, home O2)      Prior Function Level of Independence: Independent with assistive device(s)   Gait / Transfers Assistance Needed: Pt would ambulate with the rollator in the mornings, and use it when sponge bathing. Pt states the farthest walking he did was from the carport into the house.   ADL's / Homemaking Assistance Needed: independent most of the time. Assist occasional for his back and LE's from his wife.  Still driving, but  did not get out of the car when in the community.  He would go to places with drive thru's.          Hand Dominance   Dominant Hand: Right    Extremity/Trunk Assessment   Upper Extremity Assessment: Overall WFL for tasks assessed           Lower Extremity Assessment: Overall WFL for tasks assessed         Communication   Communication: No difficulties  Cognition Arousal/Alertness: Awake/alert Behavior During Therapy: WFL for tasks assessed/performed Overall Cognitive Status: Within Functional Limits for tasks assessed                       General Comments      Exercises     Assessment/Plan    PT Assessment Patient needs continued PT services  PT Problem List Decreased strength;Decreased activity tolerance;Decreased mobility;Decreased balance;Cardiopulmonary status limiting activity          PT Treatment Interventions Functional mobility training;Gait training    PT Goals (Current goals can be found in the Care Plan section)  Acute Rehab PT Goals Patient Stated Goal: Pt wants to go home.  PT Goal Formulation: With patient Time For Goal Achievement: 12/21/15 Potential to Achieve Goals: Fair    Frequency Min 2X/week   Barriers to discharge        Co-evaluation               End of Session Equipment Utilized During Treatment: Gait belt Activity Tolerance: Treatment limited secondary to medical complications (Comment) (Elevated HR. ) Patient left: in chair;with call bell/phone within reach Nurse Communication: Mobility status (Mysti, RN, notified of pt's location and HR. )    Functional Assessment Tool Used: [employer] "6-clicks"  Functional Limitation: Mobility: Walking and moving around Mobility: Walking and Moving Around Current Status [phone]): At least 1 percent but less than 20 percent impaired, limited or restricted Mobility: Walking and Moving Around Goal Status [phone]): At least 1 percent but less than 20 percent impaired, limited or restricted    Time: 1502-1530 PT Time Calculation (min) (ACUTE ONLY): 28 min   Charges:   PT Evaluation $PT Eval Moderate Complexity: 1 Procedure PT Treatments $Gait Training: 8-22 mins   PT G Codes:   PT G-Codes **NOT FOR INPATIENT CLASS** Functional Assessment Tool Used: [employer] "6-clicks"  Functional Limitation: Mobility: Walking and moving around Mobility: Walking and Moving Around Current Status [phone]): At least 1 percent but less than 20 percent impaired, limited or restricted Mobility:  Walking and Moving Around Goal Status [phone]): At least 1 percent but less than 20 percent impaired, limited or restricted    Beth Yanelly Cantrelle, PT, DPT X: [phone]

## 2015-12-14 NOTE — Progress Notes (Signed)
Pt being discharged. Instructions including medications, prescriptions, and follow up appts discussed with pt. Understanding verbalized. PIVs removed with no complications. Pt currently awaiting family to take him home.

## 2015-12-14 NOTE — Progress Notes (Addendum)
Subjective: " still have chest pressure, usually goes away when SOB improves" Pressure, constant epigastric region.  Objective: Vital signs in last 24 hours: Temp:  [97.6 F (36.4 C)-98.5 F (36.9 C)] 98 F (36.7 C) (09/22 0400) Pulse Rate:  [41-135] 63 (09/22 0600) Resp:  [12-25] 21 (09/22 0600) BP: (91-109)/(68-87) 91/75 (09/22 0600) SpO2:  [95 %-99 %] 99 % (09/22 0600) Weight:  [200 lb 6.4 oz (90.9 kg)] 200 lb 6.4 oz (90.9 kg) (09/22 0500) Weight change: 2 lb 10.3 oz (1.2 kg) Last BM Date: 12/11/15 Intake/Output from previous day: 09/21 0701 - 09/22 0700 In: 134 [I.V.:134] Out: 850 [Urine:850] Intake/Output this shift: No intake/output data recorded.  PE: General:Pleasant affect, NAD Skin:Warm and dry, brisk capillary refill HEENT:normocephalic, sclera clear, mucus membranes moist Neck:supple, no JVD  Heart:irreg irreg without murmur, gallup, rub or click Lungs:diminished throughout, bilateral wheezing [id number], non tender, + BS, do not palpate liver spleen or masses Ext:1-2+ lower ext edema, 2+ pedal pulses, 2+ radial pulses Neuro:alert and oriented X 3, MAE, follows commands, + facial symmetry Tele, rate controlled A flutter.  Rate 90s to 105    Lab Results: No results for input(s): WBC, HGB, HCT, PLT in the last 72 hours. BMET  Recent Labs  12/12/15 0506  NA 134*  K 3.8  CL 93*  CO2 36*  GLUCOSE 130*  BUN 15  CREATININE 0.67  CALCIUM 8.7*   No results for input(s): TROPONINI in the last 72 hours.  Invalid input(s): CK, MB  No results found for: CHOL, HDL, LDLCALC, LDLDIRECT, TRIG, CHOLHDL No results found for: HGBA1C   Lab Results  Component Value Date   TSH 1.397 12/08/2015      Studies/Results: Echo 12/09/15 Study Conclusions  - Left ventricle: The cavity size was normal. Wall thickness was   normal. Systolic function was normal. The estimated ejection   fraction was in the range of 55% to 60%. Although no diagnostic  regional wall motion abnormality was identified, this possibility   cannot be completely excluded on the basis of this study. - Ventricular septum: Septal motion showed abnormal function,   dyssynergy, and paradox. The contour showed systolic flattening.   These changes are consistent with RV pressure overload.  Impressions:  - The systolic PA pressure could not be estimated due to the   absence of TR jet. The 2D findings are suspicious for severely   elevated PA pressure.  Medications: I have reviewed the patient's current medications. Scheduled Meds: . Aclidinium Bromide  1 puff Inhalation Daily  . amiodarone  400 mg Oral BID  . apixaban  5 mg Oral BID  . diltiazem  180 mg Oral Daily  . guaiFENesin  600 mg Oral BID  . mometasone-formoterol  2 puff Inhalation BID  . polyethylene glycol  17 g Oral Daily  . predniSONE  50 mg Oral Q breakfast  . rOPINIRole  0.25 mg Oral QHS   Continuous Infusions:  PRN Meds:.acetaminophen, albuterol, ipratropium, levalbuterol, senna-docusate  Assessment/Plan: Principal Problem:   Atrial flutter with rapid ventricular response (HCC)- -yesterday IV amiodarone changed to 400mg  po bid x 7 days, then 200mg  bid for 3 weeks, continue Dilt.  -Plan on 6 weeks of therapy given his chronic lung disease. -Can consider DCCV after 3 weeks of anticoagulation - CHA2DS2VASc score of 1 with age.  On Eliquis 5 mg BID with plans for DCCV after 3 weeks anticoagulation if not converted with amio prior to that time. -  follow up with Dr. Harl Bowie or K. Lawrence in 5-7 days    Active Problems:   CAP (community acquired pneumonia) per IM   Chronic respiratory failure ([place]) per Im   RBBB- chronic -chest discomfort continues, atypical, troponin neg.  Echo with normal EF  -edema- may be due to steroids,  ? Dose of lasix.  Dr. Harl Bowie to see.       LOS: 6 days   Time spent with pt. :15 minutes. Cecilie Kicks  Nurse Practitioner Certified Pager [phone] or after 5pm  and on weekends call [phone] 12/14/2015, 7:53 AM   Attending Note Patient seen and discussed with NPIngold, I agree with her documentation. Remains in aflutter, rates improved but did have some elevated rates yesterday afternoon and evening. This AM rate is 100. He has been asymptomatic. Remains on amio 400mg  bid, dilt 180mg  daily. He was admitted with pneumonia and COPD exacerbation, in this setting acute respiratory setting and in the absence of symptoms we have not pursued TEE/DCCV. He still has some active wheezing today. As his acute respiratory issues resolve the drive for his tachcyardia and arrhythmia should improve as well. Would continue amio and dilt today, if heart rates < 110 reasonable for discharge today. He will f/u with [place] in 1 week in outpatient clinic. Continue anticoagulation, CHADS2Vasc score of 1, we have elected for anticoagulation, this will also help if he needs DCCV in the near future. Would plan for amio 400mg  po bid for 6 more days, then 200mg  po bid for 3 weeks, then 200mg  daily. Eliquis started 12/09/15, after 3 weeks if still in aflutter can consider DCCV. Mild LE edema, will give 40mg  of lasix IV x1. 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 I have called our office to arrange f/u.     Zandra Abts MD

## 2015-12-21 ENCOUNTER — Encounter: Payer: Self-pay | Admitting: Adult Health

## 2015-12-21 ENCOUNTER — Ambulatory Visit (INDEPENDENT_AMBULATORY_CARE_PROVIDER_SITE_OTHER): Payer: Medicare Other | Admitting: Adult Health

## 2015-12-21 ENCOUNTER — Telehealth: Payer: Self-pay | Admitting: Physician Assistant

## 2015-12-21 VITALS — BP 102/58 | HR 107 | Ht 74.0 in | Wt 198.0 lb

## 2015-12-21 DIAGNOSIS — Z79899 Other long term (current) drug therapy: Secondary | ICD-10-CM

## 2015-12-21 DIAGNOSIS — R0602 Shortness of breath: Secondary | ICD-10-CM | POA: Diagnosis not present

## 2015-12-21 DIAGNOSIS — I482 Chronic atrial fibrillation, unspecified: Secondary | ICD-10-CM

## 2015-12-21 DIAGNOSIS — R600 Localized edema: Secondary | ICD-10-CM | POA: Diagnosis not present

## 2015-12-21 MED ORDER — AMIODARONE HCL 200 MG PO TABS: 200.0000 mg | ORAL_TABLET | Freq: Two times a day (BID) | ORAL | 3 refills | Status: DC

## 2015-12-21 MED ORDER — HYDROCHLOROTHIAZIDE 25 MG PO TABS: 25.0000 mg | ORAL_TABLET | Freq: Every day | ORAL | 3 refills | Status: DC

## 2015-12-21 MED ORDER — AMIODARONE HCL 200 MG PO TABS: 200.0000 mg | ORAL_TABLET | Freq: Every day | ORAL | 3 refills | Status: DC

## 2015-12-21 NOTE — Patient Instructions (Signed)
Medication Instructions:  DECREASE AMIODARONE TO 200 MG - TWO TIMES DAILY FOR THE NEXT TWO WEEKS - AFTER THE TWO WEEKS, TAKE 200 MG - ONE TIME DAILY   START HCTZ 25 MG - TAKE ONLY AS NEEDED FOR FLUID RETENTION   Labwork: Your physician recommends that you return for lab work in: TODAY BMET CBC    Testing/Procedures: NONE  Follow-Up: Your physician recommends that you schedule a follow-up appointment in: 3 WEEKS    Any Other Special Instructions Will Be Listed Below (If Applicable).  MAKE A FOLLOW UP APPOINTMENT WITH YOUR PCP    If you need a refill on your cardiac medications before your next appointment, please call your pharmacy.

## 2015-12-21 NOTE — Progress Notes (Signed)
Cardiology Office Note   Date:  12/21/2015   ID:  David Stein, DOB 1945-11-17, MRN [id number]  PCP:  [facility]  Cardiologist:[place]anch/   Jory Sims, NP   No chief complaint on file.     History of Present Illness: David Stein is a 70 y.o. male who presents for ongoing assessment and management Of atrial fib/flutter, with history of recent hospitalization for same, lower extremity edema, pulmonary hypertension, with history to include O2 dependent lung disease. During hospitalization it was difficult to keep his heart rate controlled. He was placed on amiodarone 40 mg twice a day, diltiazem 180 mg daily. He was also treated for pneumonia with antibiotics. He was sent home on a steroid taper. For CHADS VASC Score of 2 is was placed on Eliquis. .  Left ventricle: The cavity size was normal. Wall thickness was   normal. Systolic function was normal. The estimated ejection   fraction was in the range of 55% to 60%. Although no diagnostic   regional wall motion abnormality was identified, this possibility   cannot be completely excluded on the basis of this study. - Ventricular septum: Septal motion showed abnormal function,   dyssynergy, and paradox. The contour showed systolic flattening.   These changes are consistent with RV pressure overload.  He comes today with generalized fatigue and main complaints of lower extremity edema. He denies any rapid heart rhythm, he denies bleeding issues on ELIQUIS,he remains oxyygen 2 L via nasal cannula. He states that the steroids haven't really caused him to be hungry and gain weight. He is unhappy with the generalized edema.  Past Medical History:  Diagnosis Date  . Arthritis   . COPD (chronic obstructive pulmonary disease) ([place])   . Kidney stones   . 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 Restless leg syndrome   . Shortness of breath dyspnea     Past Surgical History:  Procedure Laterality Date  . CATARACT EXTRACTION  W/PHACO Right 01/31/2014   Procedure: CATARACT EXTRACTION PHACO AND INTRAOCULAR LENS PLACEMENT RIGHT EYE CDE=6.90;  Surgeon: Elta Guadeloupe T. Gershon Crane, MD;  Location: [facility];  Service: Ophthalmology;  Laterality: Right;  . CATARACT EXTRACTION W/PHACO Left 02/14/2014   Procedure: CATARACT EXTRACTION PHACO AND INTRAOCULAR LENS PLACEMENT; CDE:  8.34;  Surgeon: Elta Guadeloupe T. Gershon Crane, MD;  Location: [facility];  Service: Ophthalmology;  Laterality: Left;  . KNEE SURGERY Left      Current Outpatient Prescriptions  Medication Sig Dispense Refill  . Aclidinium Bromide (TUDORZA PRESSAIR) 400 MCG/ACT AEPB Inhale 1 puff into the lungs daily.    Marland Kitchen albuterol (PROVENTIL) (2.5 MG/3ML) 0.083% nebulizer solution Take 2.5 mg by nebulization every 6 (six) hours as needed for wheezing or shortness of breath.     Marland Kitchen apixaban (ELIQUIS) 5 MG TABS tablet Take 1 tablet (5 mg total) by mouth 2 (two) times daily. 60 tablet 0  . budesonide-formoterol (SYMBICORT) 160-4.5 MCG/ACT inhaler Inhale 2 puffs into the lungs 2 (two) times daily.    Marland Kitchen diltiazem (CARDIZEM CD) 180 MG 24 hr capsule Take 1 capsule (180 mg total) by mouth daily. 30 capsule 0  . guaiFENesin (MUCINEX) 600 MG 12 hr tablet Take 1 tablet (600 mg total) by mouth 2 (two) times daily.    . Multiple Vitamin (MULTIVITAMIN WITH MINERALS) TABS tablet Take 1 tablet by mouth daily.    . predniSONE (DELTASONE) 20 MG tablet Take 2 tablets (40 mg total) by mouth daily. 2 tabs PO qAM x 4 days  then 1 tab PO qAM x4 days then half a tablet PO qAM x 4 days 20 tablet 0  . PROAIR HFA 108 (90 Base) MCG/ACT inhaler Inhale 1-2 puffs into the lungs every 6 (six) hours as needed for wheezing or shortness of breath.     Marland Kitchen rOPINIRole (REQUIP) 0.25 MG tablet Take 1 tablet (0.25 mg total) by mouth at bedtime. 30 tablet 0  . senna-docusate (SENOKOT-S) 8.6-50 MG tablet Take 2 tablets by mouth at bedtime as needed for mild constipation.    Marland Kitchen amiodarone (PACERONE) 200 MG tablet Take 1 tablet (200 mg total) by  mouth daily. 90 tablet 3  . hydrochlorothiazide (HYDRODIURIL) 25 MG tablet Take 1 tablet (25 mg total) by mouth daily. 90 tablet 3   No current facility-administered medications for this visit.     Allergies:   Codeine    Social History:  The patient  reports that he has been smoking Cigarettes.  He has a 13.75 pack-year smoking history. He has never used smokeless tobacco. He reports that he does not drink alcohol or use drugs.   Family History:  The patient's family history includes Alzheimer's disease in his mother; Rheumatic fever in his father; Stroke in his sister.    ROS: All other systems are reviewed and negative. Unless otherwise mentioned in H&P    PHYSICAL EXAM: VS:  BP (!) 102/58   Pulse (!) 107   Ht 6\' 2"  (1.88 m)   Wt 198 lb (89.8 kg)   SpO2 92%   BMI 25.42 kg/m  , BMI Body mass index is 25.42 kg/m. GEN: Well nourished, well developed, in no acute distress  HEENT: normal  Neck: no JVD, carotid bruits, or masses Cardiac: IRRR; no murmurs, rubs, or gallops 2+ pitting edema  Respiratory:  clear to auscultation bilaterally, normal work of breathing wearing O2 at 2/l GI: soft, nontender, nondistended, + BS MS: no deformity or atrophy  Skin: warm and dry, no rash Neuro:  Strength and sensation are intact Psych: euthymic mood, full affect   EKG:  The ekg ordered today demonstrates atrial fibrillation rate of 95 bpm with right bundle branch block, bifascicular block, Unchanged from EKG during hospitalization with the exception of rate.    Recent Labs: 12/08/2015: B Natriuretic Peptide 279.0; TSH 1.397 12/10/2015: Hemoglobin 12.0; Platelets 177 12/11/2015: ALT 20 12/12/2015: BUN 15; Creatinine, Ser 0.67; Magnesium 2.1; Potassium 3.8; Sodium 134     Wt Readings from Last 3 Encounters:  12/21/15 198 lb (89.8 kg)  12/14/15 200 lb 6.4 oz (90.9 kg)  01/26/14 186 lb (84.4 kg)     ASSESSMENT AND PLAN:  1. Atrial fibrillation with RVR: heart rate is better controlled  today but remains mildly tachycardic. This may be as result of steroids. Remains on ELIQUIS 5 mg twice a day without complaints of bleeding or excessive bruising. I will decrease amiodarone 40 mg twice a day to 200 mg twice a day for 2 weeks and then decrease to daily thereafter. I will repeat lab work to evaluate for kidney function and anemia. BMET, CBC.   2. Lower extremity edema: Likely related to steroids. Diastolic dysfunction could not be evaluated due to atrial fibrillation. I will start him on HCTZ 25 mg when necessary lower extremity edema. It is my hope that as he is being weaned off of steroids edema will subside and he will no longer need use of diuretic we'll monitor blood pressure response. May not be affected as I have decreased his amiodarone  dose.  3. Chronic obstructive pulmonary disease: he is being followed by primary care and by pulmonologist. He will be seen by Dr. Luan Pulling locally. He has not yet seen him post hospitalization. Defer respiratory treatment to pulmonology. He is on steroid taper along with steroid inhalers. Hopefully these can be adjusted. He is unhappy with the fluid and the side effects from steroids   Current medicines are reviewed at length with the patient today.    Labs/ tests ordered today include: BMET, CBC,   Orders Placed This Encounter  Procedures  . Basic Metabolic Panel (BMET)  . CBC with Differential  . EKG 12-Lead     Disposition:   FU with 2- 3 weeks.   Signed, Jory Sims, NP  12/21/2015 4:48 PM    [facility] [address], [place], [place] [address] Phone: [phone]; Fax: [phone]

## 2015-12-21 NOTE — Telephone Encounter (Signed)
Patient's daughter called in - she was told by [facility] that their fax machine wasn't working and they did not receive HCTZ medication. I called [facility] and they did not have this on file. Per Kathryn's AVS on this patient he was supposed to take HCTZ 25mg  daily only as needed. The original RX was sent in for 90 tablets but I don't feel comfortable prescribing this much given that I am not the original prescriber - have authorized 30 tabs with zero refills for now. Please review with Curt Bears to find out if she wants to send in refills. Can you also follow up on Monday whether the amiodarone rx went through? He already was on this, it was just a dose reduction that was reviewed with the patient per AVS. Thanks. Have forwarded to [facility] Triage.  Dayna Dunn PA-C

## 2015-12-21 NOTE — Addendum Note (Signed)
Addended by: Lendon Colonel on: 12/21/2015 06:04 PM   Modules accepted: Orders

## 2015-12-21 NOTE — Telephone Encounter (Signed)
I called in Rx to [facility] for HCTZ and decreased dose of amiodarone to 200 mg BID. I called the patient to let him know.

## 2015-12-21 NOTE — Progress Notes (Signed)
Name: David Stein    DOB: 11/21/1945  Age: 70 y.o.  MR#: [id number]       PCP:  [place]: Payor: [employer] park [employer] MEDICARE / Plan: [facility] MEDICARE / Product Type: *No Product type* /   CC:   No chief complaint on file.   VS Vitals:   12/21/15 1339  Pulse: (!) 107  SpO2: 92%  Weight: 198 lb (89.8 kg)  Height: 6\' 2"  (1.88 m)    Weights Current Weight  12/21/15 198 lb (89.8 kg)  12/14/15 200 lb 6.4 oz (90.9 kg)  01/26/14 186 lb (84.4 kg)    Blood Pressure  BP Readings from Last 3 Encounters:  12/14/15 111/78  02/14/14 120/76  01/31/14 123/78     Admit date:  (Not on file) Last encounter with RMR:  Visit date not found   Allergy Codeine  Current Outpatient Prescriptions  Medication Sig Dispense Refill  . Aclidinium Bromide (TUDORZA PRESSAIR) 400 MCG/ACT AEPB Inhale 1 puff into the lungs daily.    Marland Kitchen albuterol (PROVENTIL) (2.5 MG/3ML) 0.083% nebulizer solution Take 2.5 mg by nebulization every 6 (six) hours as needed for wheezing or shortness of breath.     Marland Kitchen amiodarone (PACERONE) 400 MG tablet Take 1 tablet (400 mg total) by mouth 2 (two) times daily. 60 tablet 0  . apixaban (ELIQUIS) 5 MG TABS tablet Take 1 tablet (5 mg total) by mouth 2 (two) times daily. 60 tablet 0  . budesonide-formoterol (SYMBICORT) 160-4.5 MCG/ACT inhaler Inhale 2 puffs into the lungs 2 (two) times daily.    Marland Kitchen diltiazem (CARDIZEM CD) 180 MG 24 hr capsule Take 1 capsule (180 mg total) by mouth daily. 30 capsule 0  . guaiFENesin (MUCINEX) 600 MG 12 hr tablet Take 1 tablet (600 mg total) by mouth 2 (two) times daily.    . Multiple Vitamin (MULTIVITAMIN WITH MINERALS) TABS tablet Take 1 tablet by mouth daily.    . predniSONE (DELTASONE) 20 MG tablet Take 2 tablets (40 mg total) by mouth daily. 2 tabs PO qAM x 4 days then 1 tab PO qAM x4 days then half a tablet PO qAM x 4 days 20 tablet 0  . PROAIR HFA 108 (90 Base) MCG/ACT inhaler Inhale 1-2 puffs into the lungs every 6  (six) hours as needed for wheezing or shortness of breath.     Marland Kitchen rOPINIRole (REQUIP) 0.25 MG tablet Take 1 tablet (0.25 mg total) by mouth at bedtime. 30 tablet 0  . senna-docusate (SENOKOT-S) 8.6-50 MG tablet Take 2 tablets by mouth at bedtime as needed for mild constipation.     No current facility-administered medications for this visit.     Discontinued Meds:   There are no discontinued medications.  Patient Active Problem List   Diagnosis Date Noted  . Atrial flutter with rapid ventricular response ([place]) 12/08/2015  . CAP (community acquired pneumonia) 12/08/2015  . Chronic respiratory failure ([place]) 12/08/2015  . Chronic obstructive pulmonary disease ([place])   . Arthritis   . Restless leg syndrome   . Kidney stones     LABS    Component Value Date/Time   NA 134 (L) 12/12/2015 0506   NA 134 (L) 12/11/2015 0431   NA 134 (L) 12/10/2015 0431   K 3.8 12/12/2015 0506   K 4.7 12/11/2015 0431   K 4.5 12/10/2015 0431   CL 93 (L) 12/12/2015 0506   CL 91 (L) 12/11/2015 0431   CL 93 (L) 12/10/2015  0431   CO2 36 (H) 12/12/2015 0506   CO2 37 (H) 12/11/2015 0431   CO2 37 (H) 12/10/2015 0431   GLUCOSE 130 (H) 12/12/2015 0506   GLUCOSE 156 (H) 12/11/2015 0431   GLUCOSE 165 (H) 12/10/2015 0431   BUN 15 12/12/2015 0506   BUN 15 12/11/2015 0431   BUN 11 12/10/2015 0431   CREATININE 0.67 12/12/2015 0506   CREATININE 0.75 12/11/2015 0431   CREATININE 0.58 (L) 12/10/2015 0431   CALCIUM 8.7 (L) 12/12/2015 0506   CALCIUM 8.9 12/11/2015 0431   CALCIUM 8.9 12/10/2015 0431   GFRNONAA >60 12/12/2015 0506   GFRNONAA >60 12/11/2015 0431   GFRNONAA >60 12/10/2015 0431   GFRAA >60 12/12/2015 0506   GFRAA >60 12/11/2015 0431   GFRAA >60 12/10/2015 0431   CMP     Component Value Date/Time   NA 134 (L) 12/12/2015 0506   K 3.8 12/12/2015 0506   CL 93 (L) 12/12/2015 0506   CO2 36 (H) 12/12/2015 0506   GLUCOSE 130 (H) 12/12/2015 0506   BUN 15 12/12/2015 0506   CREATININE 0.67 12/12/2015  0506   CALCIUM 8.7 (L) 12/12/2015 0506   PROT 6.3 (L) 12/11/2015 0431   ALBUMIN 3.2 (L) 12/11/2015 0431   AST 18 12/11/2015 0431   ALT 20 12/11/2015 0431   ALKPHOS 58 12/11/2015 0431   BILITOT 0.5 12/11/2015 0431   GFRNONAA >60 12/12/2015 0506   GFRAA >60 12/12/2015 0506       Component Value Date/Time   WBC 9.1 12/10/2015 0431   WBC 11.4 (H) 12/09/2015 0356   WBC 13.5 (H) 12/08/2015 1436   HGB 12.0 (L) 12/10/2015 0431   HGB 11.1 (L) 12/09/2015 0356   HGB 13.1 12/08/2015 1436   HCT 38.8 (L) 12/10/2015 0431   HCT 36.1 (L) 12/09/2015 0356   HCT 42.1 12/08/2015 1436   MCV 100.5 (H) 12/10/2015 0431   MCV 102.8 (H) 12/09/2015 0356   MCV 101.0 (H) 12/08/2015 1436    Lipid Panel  No results found for: CHOL, TRIG, HDL, CHOLHDL, VLDL, LDLCALC, LDLDIRECT  ABG    Component Value Date/Time   PHART 7.425 03/14/2011 1720   PCO2ART 42.5 03/14/2011 1720   PO2ART 59.3 (L) 03/14/2011 1720   HCO3 27.3 (H) 03/14/2011 1720   TCO2 23.4 03/14/2011 1720   O2SAT 91.5 03/14/2011 1720     Lab Results  Component Value Date   TSH 1.397 12/08/2015   BNP (last 3 results)  Recent Labs  12/08/15 1436  BNP 279.0*    ProBNP (last 3 results) No results for input(s): PROBNP in the last 8760 hours.  Cardiac Panel (last 3 results) No results for input(s): CKTOTAL, CKMB, TROPONINI, RELINDX in the last 72 hours.  Iron/TIBC/Ferritin/ %Sat No results found for: IRON, TIBC, FERRITIN, IRONPCTSAT   EKG Orders placed or performed during the hospital encounter of 12/08/15  . EKG 12-Lead  . EKG 12-Lead  . ED EKG within 10 minutes  . ED EKG within 10 minutes  . EKG  . EKG 12-Lead  . EKG 12-Lead  . EKG 12-Lead  . EKG 12-Lead     Prior Assessment and Plan Problem List as of 12/21/2015 Reviewed: 12/08/2015  3:51 PM by Loma Boston JEHIEL, DO     Cardiovascular and Mediastinum   Atrial flutter with rapid ventricular response ([place])     Respiratory   Chronic obstructive pulmonary disease  ([place])   CAP (community acquired pneumonia)   Chronic respiratory failure ([place])  Musculoskeletal and Integument   Arthritis     Genitourinary   Kidney stones     Other   Restless leg syndrome       Imaging: Dg Chest Port 1 View  Result Date: 12/08/2015 CLINICAL DATA:  Chest pain and tachycardia. Worsening shortness of breath over the past 5 days with bilateral peripheral edema. Oxygen dependent. COPD. EXAM: PORTABLE CHEST 1 VIEW COMPARISON:  02/08/2015 FINDINGS: Lungs are hyperinflated with minimal focal opacification in the left base just above the hemidiaphragm which may be due to atelectasis or infection. Evidence of emphysematous disease. Few small stable calcified granulomas. Cardiac silhouette is within normal. There is calcified plaque over the thoracic aorta. Remainder the exam is unchanged. IMPRESSION: Minimal focal opacification over the left base which may be due to atelectasis or infection. Aortic atherosclerosis. COPD. Electronically Signed   By: Marin Olp M.D.   On: 12/08/2015 15:11

## 2015-12-23 DIAGNOSIS — J439 Emphysema, unspecified: Secondary | ICD-10-CM | POA: Diagnosis not present

## 2015-12-23 DIAGNOSIS — J449 Chronic obstructive pulmonary disease, unspecified: Secondary | ICD-10-CM | POA: Diagnosis not present

## 2015-12-27 DIAGNOSIS — Z1389 Encounter for screening for other disorder: Secondary | ICD-10-CM | POA: Diagnosis not present

## 2015-12-27 DIAGNOSIS — Z6824 Body mass index (BMI) 24.0-24.9, adult: Secondary | ICD-10-CM | POA: Diagnosis not present

## 2015-12-27 DIAGNOSIS — J9600 Acute respiratory failure, unspecified whether with hypoxia or hypercapnia: Secondary | ICD-10-CM | POA: Diagnosis not present

## 2015-12-27 DIAGNOSIS — I10 Essential (primary) hypertension: Secondary | ICD-10-CM | POA: Diagnosis not present

## 2015-12-27 DIAGNOSIS — Z23 Encounter for immunization: Secondary | ICD-10-CM | POA: Diagnosis not present

## 2015-12-27 DIAGNOSIS — Z79899 Other long term (current) drug therapy: Secondary | ICD-10-CM | POA: Diagnosis not present

## 2015-12-27 DIAGNOSIS — J189 Pneumonia, unspecified organism: Secondary | ICD-10-CM | POA: Diagnosis not present

## 2015-12-28 LAB — CBC WITH DIFFERENTIAL/PLATELET
Basophils Absolute: 0 cells/uL (ref 0–200)
Basophils Relative: 0 %
Eosinophils Absolute: 118 cells/uL (ref 15–500)
Eosinophils Relative: 1 %
HCT: 41.6 % (ref 38.5–50.0)
Hemoglobin: 13.2 g/dL (ref 13.2–17.1)
Lymphocytes Relative: 10 %
Lymphs Abs: 1180 cells/uL (ref 850–3900)
MCH: 31 pg (ref 27.0–33.0)
MCHC: 31.7 g/dL — ABNORMAL LOW (ref 32.0–36.0)
MCV: 97.7 fL (ref 80.0–100.0)
MPV: 10.2 fL (ref 7.5–12.5)
Monocytes Absolute: 944 cells/uL (ref 200–950)
Monocytes Relative: 8 %
Neutro Abs: 9558 cells/uL — ABNORMAL HIGH (ref 1500–7800)
Neutrophils Relative %: 81 %
Platelets: 221 10*3/uL (ref 140–400)
RBC: 4.26 MIL/uL (ref 4.20–5.80)
RDW: 14 % (ref 11.0–15.0)
WBC: 11.8 10*3/uL — ABNORMAL HIGH (ref 3.8–10.8)

## 2015-12-28 LAB — BASIC METABOLIC PANEL
BUN: 13 mg/dL (ref 7–25)
CO2: 40 mmol/L — ABNORMAL HIGH (ref 20–31)
Calcium: 9.3 mg/dL (ref 8.6–10.3)
Chloride: 96 mmol/L — ABNORMAL LOW (ref 98–110)
Creat: 0.91 mg/dL (ref 0.70–1.18)
Glucose, Bld: 62 mg/dL — ABNORMAL LOW (ref 65–99)
Potassium: 5.1 mmol/L (ref 3.5–5.3)
Sodium: 140 mmol/L (ref 135–146)

## 2016-01-04 ENCOUNTER — Telehealth: Payer: Self-pay | Admitting: Adult Health

## 2016-01-04 NOTE — Telephone Encounter (Signed)
Pt is going to the Dentist for a regular cleaning, he's taking Eliquis, does he need to stop taking it prior to the apt? Please give Mr. Blaize a call @ [phone]

## 2016-01-04 NOTE — Telephone Encounter (Signed)
will forward to Arnold Long NP for advice

## 2016-01-04 NOTE — Telephone Encounter (Signed)
He does not need to stop Eliquis for routine cleaning

## 2016-01-04 NOTE — Telephone Encounter (Signed)
Pt.notified

## 2016-01-11 ENCOUNTER — Ambulatory Visit (INDEPENDENT_AMBULATORY_CARE_PROVIDER_SITE_OTHER): Payer: Medicare Other | Admitting: Adult Health

## 2016-01-11 ENCOUNTER — Encounter: Payer: Self-pay | Admitting: Adult Health

## 2016-01-11 VITALS — BP 116/70 | HR 75 | Ht 74.0 in | Wt 190.0 lb

## 2016-01-11 DIAGNOSIS — I272 Pulmonary hypertension, unspecified: Secondary | ICD-10-CM

## 2016-01-11 DIAGNOSIS — R600 Localized edema: Secondary | ICD-10-CM

## 2016-01-11 DIAGNOSIS — I4892 Unspecified atrial flutter: Secondary | ICD-10-CM

## 2016-01-11 MED ORDER — AMIODARONE HCL 200 MG PO TABS: 200.0000 mg | ORAL_TABLET | Freq: Every day | ORAL | 3 refills | Status: DC

## 2016-01-11 MED ORDER — FUROSEMIDE 20 MG PO TABS: 20.0000 mg | ORAL_TABLET | Freq: Every day | ORAL | 6 refills | Status: DC

## 2016-01-11 MED ORDER — DILTIAZEM HCL ER COATED BEADS 180 MG PO CP24: 180.0000 mg | ORAL_CAPSULE | Freq: Every day | ORAL | 3 refills | Status: DC

## 2016-01-11 NOTE — Patient Instructions (Signed)
Your physician recommends that you schedule a follow-up appointment in: 3 Months with Dr. Harl Bowie   Your physician recommends that you continue on your current medications as directed. Please refer to the Current Medication list given to you today.  Lasix 20 mg Daily as needed   If you need a refill on your cardiac medications before your next appointment, please call your pharmacy.  Thank you for choosing [facility]!

## 2016-01-11 NOTE — Progress Notes (Signed)
Name: David Stein    DOB: 1946/03/22  Age: 70 y.o.  MR#: [id number]       PCP:  [place]: Payor: Theme park manager MEDICARE / Plan: [facility] MEDICARE / Product Type: *No Product type* /   CC:   No chief complaint on file.   VS Vitals:   01/11/16 1311  Weight: 190 lb (86.2 kg)  Height: 6\' 2"  (1.88 m)    Weights Current Weight  01/11/16 190 lb (86.2 kg)  12/21/15 198 lb (89.8 kg)  12/14/15 200 lb 6.4 oz (90.9 kg)    Blood Pressure  BP Readings from Last 3 Encounters:  12/21/15 (!) 102/58  12/14/15 111/78  02/14/14 120/76     Admit date:  (Not on file) Last encounter with RMR:  01/04/2016   Allergy Codeine  Current Outpatient Prescriptions  Medication Sig Dispense Refill  . Aclidinium Bromide (TUDORZA PRESSAIR) 400 MCG/ACT AEPB Inhale 1 puff into the lungs daily.    Marland Kitchen albuterol (PROVENTIL) (2.5 MG/3ML) 0.083% nebulizer solution Take 2.5 mg by nebulization every 6 (six) hours as needed for wheezing or shortness of breath.     Marland Kitchen amiodarone (PACERONE) 200 MG tablet Take 1 tablet (200 mg total) by mouth 2 (two) times daily. 90 tablet 3  . apixaban (ELIQUIS) 5 MG TABS tablet Take 1 tablet (5 mg total) by mouth 2 (two) times daily. 60 tablet 0  . budesonide-formoterol (SYMBICORT) 160-4.5 MCG/ACT inhaler Inhale 2 puffs into the lungs 2 (two) times daily.    Marland Kitchen diltiazem (CARDIZEM CD) 180 MG 24 hr capsule Take 1 capsule (180 mg total) by mouth daily. 30 capsule 0  . guaiFENesin (MUCINEX) 600 MG 12 hr tablet Take 1 tablet (600 mg total) by mouth 2 (two) times daily.    . hydrochlorothiazide (HYDRODIURIL) 25 MG tablet Take 1 tablet (25 mg total) by mouth daily. 90 tablet 3  . Multiple Vitamin (MULTIVITAMIN WITH MINERALS) TABS tablet Take 1 tablet by mouth daily.    Marland Kitchen PROAIR HFA 108 (90 Base) MCG/ACT inhaler Inhale 1-2 puffs into the lungs every 6 (six) hours as needed for wheezing or shortness of breath.     Marland Kitchen rOPINIRole (REQUIP) 0.25 MG tablet Take 1  tablet (0.25 mg total) by mouth at bedtime. 30 tablet 0  . senna-docusate (SENOKOT-S) 8.6-50 MG tablet Take 2 tablets by mouth at bedtime as needed for mild constipation.     No current facility-administered medications for this visit.     Discontinued Meds:   There are no discontinued medications.  Patient Active Problem List   Diagnosis Date Noted  . Atrial flutter with rapid ventricular response ([place]) 12/08/2015  . CAP (community acquired pneumonia) 12/08/2015  . Chronic respiratory failure ([place]) 12/08/2015  . Chronic obstructive pulmonary disease ([place])   . Arthritis   . Restless leg syndrome   . Kidney stones     LABS    Component Value Date/Time   NA 140 12/27/2015 1426   NA 134 (L) 12/12/2015 0506   NA 134 (L) 12/11/2015 0431   K 5.1 12/27/2015 1426   K 3.8 12/12/2015 0506   K 4.7 12/11/2015 0431   CL 96 (L) 12/27/2015 1426   CL 93 (L) 12/12/2015 0506   CL 91 (L) 12/11/2015 0431   CO2 40 (H) 12/27/2015 1426   CO2 36 (H) 12/12/2015 0506   CO2 37 (H) 12/11/2015 0431   GLUCOSE 62 (L) 12/27/2015 1426   GLUCOSE 130 (  H) 12/12/2015 0506   GLUCOSE 156 (H) 12/11/2015 0431   BUN 13 12/27/2015 1426   BUN 15 12/12/2015 0506   BUN 15 12/11/2015 0431   CREATININE 0.91 12/27/2015 1426   CREATININE 0.67 12/12/2015 0506   CREATININE 0.75 12/11/2015 0431   CREATININE 0.58 (L) 12/10/2015 0431   CALCIUM 9.3 12/27/2015 1426   CALCIUM 8.7 (L) 12/12/2015 0506   CALCIUM 8.9 12/11/2015 0431   GFRNONAA >60 12/12/2015 0506   GFRNONAA >60 12/11/2015 0431   GFRNONAA >60 12/10/2015 0431   GFRAA >60 12/12/2015 0506   GFRAA >60 12/11/2015 0431   GFRAA >60 12/10/2015 0431   CMP     Component Value Date/Time   NA 140 12/27/2015 1426   K 5.1 12/27/2015 1426   CL 96 (L) 12/27/2015 1426   CO2 40 (H) 12/27/2015 1426   GLUCOSE 62 (L) 12/27/2015 1426   BUN 13 12/27/2015 1426   CREATININE 0.91 12/27/2015 1426   CALCIUM 9.3 12/27/2015 1426   PROT 6.3 (L) 12/11/2015 0431   ALBUMIN 3.2  (L) 12/11/2015 0431   AST 18 12/11/2015 0431   ALT 20 12/11/2015 0431   ALKPHOS 58 12/11/2015 0431   BILITOT 0.5 12/11/2015 0431   GFRNONAA >60 12/12/2015 0506   GFRAA >60 12/12/2015 0506       Component Value Date/Time   WBC 11.8 (H) 12/27/2015 1426   WBC 9.1 12/10/2015 0431   WBC 11.4 (H) 12/09/2015 0356   HGB 13.2 12/27/2015 1426   HGB 12.0 (L) 12/10/2015 0431   HGB 11.1 (L) 12/09/2015 0356   HCT 41.6 12/27/2015 1426   HCT 38.8 (L) 12/10/2015 0431   HCT 36.1 (L) 12/09/2015 0356   MCV 97.7 12/27/2015 1426   MCV 100.5 (H) 12/10/2015 0431   MCV 102.8 (H) 12/09/2015 0356    Lipid Panel  No results found for: CHOL, TRIG, HDL, CHOLHDL, VLDL, LDLCALC, LDLDIRECT  ABG    Component Value Date/Time   PHART 7.425 03/14/2011 1720   PCO2ART 42.5 03/14/2011 1720   PO2ART 59.3 (L) 03/14/2011 1720   HCO3 27.3 (H) 03/14/2011 1720   TCO2 23.4 03/14/2011 1720   O2SAT 91.5 03/14/2011 1720     Lab Results  Component Value Date   TSH 1.397 12/08/2015   BNP (last 3 results)  Recent Labs  12/08/15 1436  BNP 279.0*    ProBNP (last 3 results) No results for input(s): PROBNP in the last 8760 hours.  Cardiac Panel (last 3 results) No results for input(s): CKTOTAL, CKMB, TROPONINI, RELINDX in the last 72 hours.  Iron/TIBC/Ferritin/ %Sat No results found for: IRON, TIBC, FERRITIN, IRONPCTSAT   EKG Orders placed or performed in visit on 12/21/15  . EKG 12-Lead     Prior Assessment and Plan Problem List as of 01/11/2016 Reviewed: 12/21/2015  4:59 PM by Jory Sims, NP     Cardiovascular and Mediastinum   Atrial flutter with rapid ventricular response ([place])     Respiratory   Chronic obstructive pulmonary disease ([place])   CAP (community acquired pneumonia)   Chronic respiratory failure ([place])     Musculoskeletal and Integument   Arthritis     Genitourinary   Kidney stones     Other   Restless leg syndrome       Imaging: No results found.

## 2016-01-11 NOTE — Progress Notes (Signed)
Cardiology Office Note   Date:  01/11/2016   ID:  David Stein, DOB 02-10-1946, MRN [id number]  PCP:  [facility]  Cardiologist: Cloria Spring, NP   Chief Complaint  Patient presents with  . Atrial Flutter  . Congestive Heart Failure      History of Present Illness: David Stein is a 70 y.o. male who presents for ongoing assessment and management of atrial fib/flutter, history of chronic lower extremity edema, pulmonary hypertension, O2 dependent COPD. He was last seen in the office on 12/21/2015.   At that time, the patient complained of generalized fatigue and continued lower extremity edema. His amiodarone dose was decreased from 400 mg twice a day to 200 mg twice a day for 2 weeks and then decrease to daily thereafter. Follow-up lab work was ordered. He was felt that his lower extremity edema was related to steroids which she was taking for his lung disease. He was started on HCTZ 25 mg when necessary lower extremity edema.  Follow-up labs revealed sodium of 140 potassium 5.1 chloride 96 CO2 40 BUN 13 creatinine 0.91. He was not found to be anemic hemoglobin 13.2 hematocrit 41.6, white blood cells elevated 11.8 (patient on steroids) platelets 221.  Left ventricle: The cavity size was normal. Wall thickness was   normal. Systolic function was normal. The estimated ejection   fraction was in the range of 55% to 60%. Although no diagnostic   regional wall motion abnormality was identified, this possibility   cannot be completely excluded on the basis of this study. - Ventricular septum: Septal motion showed abnormal function,   dyssynergy, and paradox. The contour showed systolic flattening.   These changes are consistent with RV pressure overload.  Impressions:  - The systolic PA pressure could not be estimated due to the   absence of TR jet. The 2D findings are suspicious for severely   elevated PA pressure.  He is here today feeling much  better. He has no further lower extremity edema. She is being seen last he followed up with Dr. Gerarda Fraction, and was taken off of HCTZ and placed on Lasix 20 mg daily. This was changed to Lasix 20 mg when necessary lower extremity edema. He has lost 8 pounds since last office visit. He states he is feeling better and has continued to be compliant on his medicines. He has decreased his amiodarone down to 200 mg once a day.  Past Medical History:  Diagnosis Date  . Arthritis   . COPD (chronic obstructive pulmonary disease) ([place])   . Kidney stones   . 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 Restless leg syndrome   . Shortness of breath dyspnea     Past Surgical History:  Procedure Laterality Date  . CATARACT EXTRACTION W/PHACO Right 01/31/2014   Procedure: CATARACT EXTRACTION PHACO AND INTRAOCULAR LENS PLACEMENT RIGHT EYE CDE=6.90;  Surgeon: Elta Guadeloupe T. Gershon Crane, MD;  Location: [facility];  Service: Ophthalmology;  Laterality: Right;  . CATARACT EXTRACTION W/PHACO Left 02/14/2014   Procedure: CATARACT EXTRACTION PHACO AND INTRAOCULAR LENS PLACEMENT; CDE:  8.34;  Surgeon: Elta Guadeloupe T. Gershon Crane, MD;  Location: [facility];  Service: Ophthalmology;  Laterality: Left;  . KNEE SURGERY Left      Current Outpatient Prescriptions  Medication Sig Dispense Refill  . Aclidinium Bromide (TUDORZA PRESSAIR) 400 MCG/ACT AEPB Inhale 1 puff into the lungs daily.    Marland Kitchen albuterol (PROVENTIL) (2.5 MG/3ML) 0.083% nebulizer solution Take 2.5 mg by nebulization every 6 (  six) hours as needed for wheezing or shortness of breath.     Marland Kitchen amiodarone (PACERONE) 200 MG tablet Take 1 tablet (200 mg total) by mouth 2 (two) times daily. 90 tablet 3  . apixaban (ELIQUIS) 5 MG TABS tablet Take 1 tablet (5 mg total) by mouth 2 (two) times daily. 60 tablet 0  . budesonide-formoterol (SYMBICORT) 160-4.5 MCG/ACT inhaler Inhale 2 puffs into the lungs 2 (two) times daily.    Marland Kitchen diltiazem (CARDIZEM CD) 180 MG 24 hr capsule Take 1 capsule (180 mg total) by mouth daily. 30  capsule 0  . guaiFENesin (MUCINEX) 600 MG 12 hr tablet Take 1 tablet (600 mg total) by mouth 2 (two) times daily.    . hydrochlorothiazide (HYDRODIURIL) 25 MG tablet Take 1 tablet (25 mg total) by mouth daily. 90 tablet 3  . Multiple Vitamin (MULTIVITAMIN WITH MINERALS) TABS tablet Take 1 tablet by mouth daily.    Marland Kitchen PROAIR HFA 108 (90 Base) MCG/ACT inhaler Inhale 1-2 puffs into the lungs every 6 (six) hours as needed for wheezing or shortness of breath.     Marland Kitchen rOPINIRole (REQUIP) 0.25 MG tablet Take 1 tablet (0.25 mg total) by mouth at bedtime. 30 tablet 0  . senna-docusate (SENOKOT-S) 8.6-50 MG tablet Take 2 tablets by mouth at bedtime as needed for mild constipation.     No current facility-administered medications for this visit.     Allergies:   Codeine    Social History:  The patient  reports that he quit smoking about 4 weeks ago. His smoking use included Cigarettes. He has a 13.75 pack-year smoking history. He has never used smokeless tobacco. He reports that he does not drink alcohol or use drugs.   Family History:  The patient's family history includes Alzheimer's disease in his mother; Rheumatic fever in his father; Stroke in his sister.    ROS: All other systems are reviewed and negative. Unless otherwise mentioned in H&P    PHYSICAL EXAM: VS:  BP 116/70   Pulse 75   Ht 6\' 2"  (1.88 m)   Wt 190 lb (86.2 kg)   SpO2 95%   BMI 24.39 kg/m  , BMI Body mass index is 24.39 kg/m. GEN: Well nourished, well developed, in no acute distress  HEENT: normal  Neck: no JVD, carotid bruits, or masses Cardiac: IRRR; no murmurs, rubs, or gallops,no edema  Respiratory:  clear to auscultation bilaterally, normal work of breathing, wearing O2 via nasal cannula GI: soft, nontender, nondistended, + BS MS: no deformity or atrophy  Skin: warm and dry, no rash Neuro:  Strength and sensation are intact Psych: euthymic mood, full affect   EKG:  The ekg ordered today demonstrates normal sinus  rhythm with right bundle branch block. Possible right ventricular hypertrophy. Rate of 70 bpm.   Recent Labs: 12/08/2015: B Natriuretic Peptide 279.0; TSH 1.397 12/11/2015: ALT 20 12/12/2015: Magnesium 2.1 12/27/2015: BUN 13; Creat 0.91; Hemoglobin 13.2; Platelets 221; Potassium 5.1; Sodium 140      Wt Readings from Last 3 Encounters:  01/11/16 190 lb (86.2 kg)  12/21/15 198 lb (89.8 kg)  12/14/15 200 lb 6.4 oz (90.9 kg)     ASSESSMENT AND PLAN:  1.Atrial flutter: He is now maintained in normal sinus rhythm with right bundle branch block. He remains on amiodarone 200 mg daily. May need to consider discontinuing on next follow-up visit with known lung disease. For now he will not have any changes in his current regimen. ELIQUIS will continue at 5  mg twice a day  CHADS VASC Score of 2. Progress has been made concerning his eligibility she received the ELIQUIS through his insurance company.  2. Lower extremity edema: This has completely resolved. HCTZ has been discontinued, and he has been placed on Lasix 20 mg daily by primary care. A copy of the most recent labs will be sent to his primary care physician along with a copy of his EKG.  3. Pulmonary Hypertension: The patient was found to have high lung pressures on echocardiogram. He may need to be scheduled for right heart cath. He is going to be seen by Dr. branch on follow-up in this can be discussed at that time. Currently he is stable and is fully compensated concerning fluid status. He remains on oxygen 24 hours a day.   Current medicines are reviewed at length with the patient today.    Labs/ tests ordered today include:   Orders Placed This Encounter  Procedures  . EKG 12-Lead     Disposition:   FU with  Dr. Harl Bowie in 3 months. Signed, Jory Sims, NP  01/11/2016 1:39 PM    [facility] [address], [place], [place] [address] Phone: [phone]; Fax: [phone]

## 2016-01-23 DIAGNOSIS — J449 Chronic obstructive pulmonary disease, unspecified: Secondary | ICD-10-CM | POA: Diagnosis not present

## 2016-01-23 DIAGNOSIS — J439 Emphysema, unspecified: Secondary | ICD-10-CM | POA: Diagnosis not present

## 2016-01-29 DIAGNOSIS — R609 Edema, unspecified: Secondary | ICD-10-CM | POA: Diagnosis not present

## 2016-01-29 DIAGNOSIS — J9611 Chronic respiratory failure with hypoxia: Secondary | ICD-10-CM | POA: Diagnosis not present

## 2016-01-29 DIAGNOSIS — J449 Chronic obstructive pulmonary disease, unspecified: Secondary | ICD-10-CM | POA: Diagnosis not present

## 2016-01-29 DIAGNOSIS — J189 Pneumonia, unspecified organism: Secondary | ICD-10-CM | POA: Diagnosis not present

## 2016-02-22 DIAGNOSIS — J449 Chronic obstructive pulmonary disease, unspecified: Secondary | ICD-10-CM | POA: Diagnosis not present

## 2016-02-22 DIAGNOSIS — J439 Emphysema, unspecified: Secondary | ICD-10-CM | POA: Diagnosis not present

## 2016-03-05 DIAGNOSIS — J9611 Chronic respiratory failure with hypoxia: Secondary | ICD-10-CM | POA: Diagnosis not present

## 2016-03-05 DIAGNOSIS — J449 Chronic obstructive pulmonary disease, unspecified: Secondary | ICD-10-CM | POA: Diagnosis not present

## 2016-03-05 DIAGNOSIS — I480 Paroxysmal atrial fibrillation: Secondary | ICD-10-CM | POA: Diagnosis not present

## 2016-03-24 DIAGNOSIS — J449 Chronic obstructive pulmonary disease, unspecified: Secondary | ICD-10-CM | POA: Diagnosis not present

## 2016-03-24 DIAGNOSIS — J439 Emphysema, unspecified: Secondary | ICD-10-CM | POA: Diagnosis not present

## 2016-04-15 ENCOUNTER — Encounter: Payer: Self-pay | Admitting: Cardiology

## 2016-04-15 ENCOUNTER — Ambulatory Visit (INDEPENDENT_AMBULATORY_CARE_PROVIDER_SITE_OTHER): Payer: Medicare Other | Admitting: Cardiology

## 2016-04-15 VITALS — BP 114/58 | HR 70 | Ht 74.0 in | Wt 198.0 lb

## 2016-04-15 DIAGNOSIS — R600 Localized edema: Secondary | ICD-10-CM | POA: Diagnosis not present

## 2016-04-15 DIAGNOSIS — I4892 Unspecified atrial flutter: Secondary | ICD-10-CM | POA: Diagnosis not present

## 2016-04-15 NOTE — Progress Notes (Signed)
Clinical Summary Mr. Burtenshaw is a 71 y.o.male seen today for follow up of the following medical problems.   1. Aflutter - on amio and eliquis - no recent palpitations - compliant with meds. No bleeding issues on eliquis.   2. Chronic lower extremity edema - has essentially resolved. Has not required prn diuretics in quite some time.   3. COPD - echo could not estimate PASP due to insufficient TR jet, however flattening of ventricular septum suggests. Normal RV size and function is reported.  - followed by Dr Luan Pulling, on home O2.   Past Medical History:  Diagnosis Date  . Arthritis   . COPD (chronic obstructive pulmonary disease) ([place])   . Kidney stones   . 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 Restless leg syndrome   . Shortness of breath dyspnea      Allergies  Allergen Reactions  . Codeine Other (See Comments)    headache     Current Outpatient Prescriptions  Medication Sig Dispense Refill  . Aclidinium Bromide (TUDORZA PRESSAIR) 400 MCG/ACT AEPB Inhale 1 puff into the lungs daily.    Marland Kitchen albuterol (PROVENTIL) (2.5 MG/3ML) 0.083% nebulizer solution Take 2.5 mg by nebulization every 6 (six) hours as needed for wheezing or shortness of breath.     Marland Kitchen amiodarone (PACERONE) 200 MG tablet Take 1 tablet (200 mg total) by mouth daily. 90 tablet 3  . apixaban (ELIQUIS) 5 MG TABS tablet Take 1 tablet (5 mg total) by mouth 2 (two) times daily. 60 tablet 0  . budesonide-formoterol (SYMBICORT) 160-4.5 MCG/ACT inhaler Inhale 2 puffs into the lungs 2 (two) times daily.    Marland Kitchen diltiazem (CARDIZEM CD) 180 MG 24 hr capsule Take 1 capsule (180 mg total) by mouth daily. 90 capsule 3  . furosemide (LASIX) 20 MG tablet Take 1 tablet (20 mg total) by mouth daily. 30 tablet 6  . guaiFENesin (MUCINEX) 600 MG 12 hr tablet Take 1 tablet (600 mg total) by mouth 2 (two) times daily.    . hydrochlorothiazide (HYDRODIURIL) 25 MG tablet Take 1 tablet (25 mg total) by mouth daily. 90 tablet 3  . Multiple  Vitamin (MULTIVITAMIN WITH MINERALS) TABS tablet Take 1 tablet by mouth daily.    Marland Kitchen PROAIR HFA 108 (90 Base) MCG/ACT inhaler Inhale 1-2 puffs into the lungs every 6 (six) hours as needed for wheezing or shortness of breath.     Marland Kitchen rOPINIRole (REQUIP) 0.25 MG tablet Take 1 tablet (0.25 mg total) by mouth at bedtime. 30 tablet 0  . senna-docusate (SENOKOT-S) 8.6-50 MG tablet Take 2 tablets by mouth at bedtime as needed for mild constipation.     No current facility-administered medications for this visit.      Past Surgical History:  Procedure Laterality Date  . CATARACT EXTRACTION W/PHACO Right 01/31/2014   Procedure: CATARACT EXTRACTION PHACO AND INTRAOCULAR LENS PLACEMENT RIGHT EYE CDE=6.90;  Surgeon: Elta Guadeloupe T. Gershon Crane, MD;  Location: [facility];  Service: Ophthalmology;  Laterality: Right;  . CATARACT EXTRACTION W/PHACO Left 02/14/2014   Procedure: CATARACT EXTRACTION PHACO AND INTRAOCULAR LENS PLACEMENT; CDE:  8.34;  Surgeon: Elta Guadeloupe T. Gershon Crane, MD;  Location: [facility];  Service: Ophthalmology;  Laterality: Left;  . KNEE SURGERY Left      Allergies  Allergen Reactions  . Codeine Other (See Comments)    headache      Family History  Problem Relation Age of Onset  . Rheumatic fever Father   . Alzheimer's disease Mother   .  Stroke Sister      Social History Mr. Goodridge reports that he quit smoking about 4 months ago. His smoking use included Cigarettes. He has a 13.75 pack-year smoking history. He has never used smokeless tobacco. Mr. Singleton reports that he does not drink alcohol.   Review of Systems CONSTITUTIONAL: No weight loss, fever, chills, weakness or fatigue.  HEENT: Eyes: No visual loss, blurred vision, double vision or yellow sclerae.No hearing loss, sneezing, congestion, runny nose or sore throat.  SKIN: No rash or itching.  CARDIOVASCULAR: per HPI RESPIRATORY: chronic stable SOB GASTROINTESTINAL: No anorexia, nausea, vomiting or diarrhea. No abdominal pain or blood.    GENITOURINARY: No burning on urination, no polyuria NEUROLOGICAL: No headache, dizziness, syncope, paralysis, ataxia, numbness or tingling in the extremities. No change in bowel or bladder control.  MUSCULOSKELETAL: No muscle, back pain, joint pain or stiffness.  LYMPHATICS: No enlarged nodes. No history of splenectomy.  PSYCHIATRIC: No history of depression or anxiety.  ENDOCRINOLOGIC: No reports of sweating, cold or heat intolerance. No polyuria or polydipsia.  Marland Kitchen   Physical Examination Vitals:   04/15/16 1041  BP: (!) 114/58  Pulse: 70   Vitals:   04/15/16 1041  Weight: 198 lb (89.8 kg)  Height: 6\' 2"  (1.88 m)    Gen: resting comfortably, no acute distress HEENT: no scleral icterus, pupils equal round and reactive, no palptable cervical adenopathy,  CV: RRR, no m/r/g, no jvd Resp: Clear to auscultation bilaterally GI: abdomen is soft, non-tender, non-distended, normal bowel sounds, no hepatosplenomegaly MSK: extremities are warm, no edema.  Skin: warm, no rash Neuro:  no focal deficits Psych: appropriate affect   Diagnostic Studies  11/2015 echo Study Conclusions  - Left ventricle: The cavity size was normal. Wall thickness was   normal. Systolic function was normal. The estimated ejection   fraction was in the range of 55% to 60%. Although no diagnostic   regional wall motion abnormality was identified, this possibility   cannot be completely excluded on the basis of this study. - Ventricular septum: Septal motion showed abnormal function,   dyssynergy, and paradox. The contour showed systolic flattening.   These changes are consistent with RV pressure overload.  Impressions:  - The systolic PA pressure could not be estimated due to the   absence of TR jet. The 2D findings are suspicious for severely   elevated PA pressure.   Assessment and Plan  1. Aflutter - no recent symptoms, EKG in clinic today shows SR - continue amio. TSH and liver panel normal  just a few months ago - CHADS2Vasc score is 2, continue anticoag  2. Chronic LE edema - doing well, continue prn diuretics.     Arnoldo Lenis, M.D.

## 2016-04-15 NOTE — Patient Instructions (Signed)

## 2016-04-24 DIAGNOSIS — J449 Chronic obstructive pulmonary disease, unspecified: Secondary | ICD-10-CM | POA: Diagnosis not present

## 2016-04-24 DIAGNOSIS — J439 Emphysema, unspecified: Secondary | ICD-10-CM | POA: Diagnosis not present

## 2016-05-22 DIAGNOSIS — J439 Emphysema, unspecified: Secondary | ICD-10-CM | POA: Diagnosis not present

## 2016-05-22 DIAGNOSIS — J449 Chronic obstructive pulmonary disease, unspecified: Secondary | ICD-10-CM | POA: Diagnosis not present

## 2016-06-03 DIAGNOSIS — J449 Chronic obstructive pulmonary disease, unspecified: Secondary | ICD-10-CM | POA: Diagnosis not present

## 2016-06-03 DIAGNOSIS — I480 Paroxysmal atrial fibrillation: Secondary | ICD-10-CM | POA: Diagnosis not present

## 2016-06-03 DIAGNOSIS — I509 Heart failure, unspecified: Secondary | ICD-10-CM | POA: Diagnosis not present

## 2016-06-03 DIAGNOSIS — J9611 Chronic respiratory failure with hypoxia: Secondary | ICD-10-CM | POA: Diagnosis not present

## 2016-06-22 DIAGNOSIS — J439 Emphysema, unspecified: Secondary | ICD-10-CM | POA: Diagnosis not present

## 2016-06-22 DIAGNOSIS — J449 Chronic obstructive pulmonary disease, unspecified: Secondary | ICD-10-CM | POA: Diagnosis not present

## 2016-07-22 DIAGNOSIS — J439 Emphysema, unspecified: Secondary | ICD-10-CM | POA: Diagnosis not present

## 2016-07-22 DIAGNOSIS — J449 Chronic obstructive pulmonary disease, unspecified: Secondary | ICD-10-CM | POA: Diagnosis not present

## 2016-08-22 DIAGNOSIS — J449 Chronic obstructive pulmonary disease, unspecified: Secondary | ICD-10-CM | POA: Diagnosis not present

## 2016-08-22 DIAGNOSIS — J439 Emphysema, unspecified: Secondary | ICD-10-CM | POA: Diagnosis not present

## 2016-09-12 DIAGNOSIS — K5900 Constipation, unspecified: Secondary | ICD-10-CM | POA: Diagnosis not present

## 2016-09-12 DIAGNOSIS — B355 Tinea imbricata: Secondary | ICD-10-CM | POA: Diagnosis not present

## 2016-09-12 DIAGNOSIS — Z1389 Encounter for screening for other disorder: Secondary | ICD-10-CM | POA: Diagnosis not present

## 2016-09-21 DIAGNOSIS — J449 Chronic obstructive pulmonary disease, unspecified: Secondary | ICD-10-CM | POA: Diagnosis not present

## 2016-09-21 DIAGNOSIS — J439 Emphysema, unspecified: Secondary | ICD-10-CM | POA: Diagnosis not present

## 2016-10-03 DIAGNOSIS — J9611 Chronic respiratory failure with hypoxia: Secondary | ICD-10-CM | POA: Diagnosis not present

## 2016-10-03 DIAGNOSIS — G2581 Restless legs syndrome: Secondary | ICD-10-CM | POA: Diagnosis not present

## 2016-10-03 DIAGNOSIS — J449 Chronic obstructive pulmonary disease, unspecified: Secondary | ICD-10-CM | POA: Diagnosis not present

## 2016-10-03 DIAGNOSIS — I480 Paroxysmal atrial fibrillation: Secondary | ICD-10-CM | POA: Diagnosis not present

## 2016-10-22 DIAGNOSIS — J449 Chronic obstructive pulmonary disease, unspecified: Secondary | ICD-10-CM | POA: Diagnosis not present

## 2016-10-22 DIAGNOSIS — J439 Emphysema, unspecified: Secondary | ICD-10-CM | POA: Diagnosis not present

## 2016-11-05 ENCOUNTER — Encounter: Payer: Self-pay | Admitting: Cardiology

## 2016-11-05 DIAGNOSIS — J449 Chronic obstructive pulmonary disease, unspecified: Secondary | ICD-10-CM | POA: Diagnosis not present

## 2016-11-05 DIAGNOSIS — R946 Abnormal results of thyroid function studies: Secondary | ICD-10-CM | POA: Diagnosis not present

## 2016-11-05 DIAGNOSIS — J9610 Chronic respiratory failure, unspecified whether with hypoxia or hypercapnia: Secondary | ICD-10-CM | POA: Diagnosis not present

## 2016-11-05 DIAGNOSIS — Z125 Encounter for screening for malignant neoplasm of prostate: Secondary | ICD-10-CM | POA: Diagnosis not present

## 2016-11-05 DIAGNOSIS — Z1389 Encounter for screening for other disorder: Secondary | ICD-10-CM | POA: Diagnosis not present

## 2016-11-05 DIAGNOSIS — I10 Essential (primary) hypertension: Secondary | ICD-10-CM | POA: Diagnosis not present

## 2016-11-05 DIAGNOSIS — R7309 Other abnormal glucose: Secondary | ICD-10-CM | POA: Diagnosis not present

## 2016-11-05 DIAGNOSIS — Z0001 Encounter for general adult medical examination with abnormal findings: Secondary | ICD-10-CM | POA: Diagnosis not present

## 2016-11-22 DIAGNOSIS — J439 Emphysema, unspecified: Secondary | ICD-10-CM | POA: Diagnosis not present

## 2016-11-22 DIAGNOSIS — J449 Chronic obstructive pulmonary disease, unspecified: Secondary | ICD-10-CM | POA: Diagnosis not present

## 2016-12-17 DIAGNOSIS — E039 Hypothyroidism, unspecified: Secondary | ICD-10-CM | POA: Diagnosis not present

## 2016-12-17 DIAGNOSIS — J9610 Chronic respiratory failure, unspecified whether with hypoxia or hypercapnia: Secondary | ICD-10-CM | POA: Diagnosis not present

## 2016-12-17 DIAGNOSIS — J069 Acute upper respiratory infection, unspecified: Secondary | ICD-10-CM | POA: Diagnosis not present

## 2016-12-17 DIAGNOSIS — J449 Chronic obstructive pulmonary disease, unspecified: Secondary | ICD-10-CM | POA: Diagnosis not present

## 2016-12-22 DIAGNOSIS — J439 Emphysema, unspecified: Secondary | ICD-10-CM | POA: Diagnosis not present

## 2016-12-22 DIAGNOSIS — J449 Chronic obstructive pulmonary disease, unspecified: Secondary | ICD-10-CM | POA: Diagnosis not present

## 2017-01-22 DIAGNOSIS — J449 Chronic obstructive pulmonary disease, unspecified: Secondary | ICD-10-CM | POA: Diagnosis not present

## 2017-01-22 DIAGNOSIS — J439 Emphysema, unspecified: Secondary | ICD-10-CM | POA: Diagnosis not present

## 2017-02-21 DIAGNOSIS — J439 Emphysema, unspecified: Secondary | ICD-10-CM | POA: Diagnosis not present

## 2017-02-21 DIAGNOSIS — J449 Chronic obstructive pulmonary disease, unspecified: Secondary | ICD-10-CM | POA: Diagnosis not present

## 2017-03-24 DIAGNOSIS — J439 Emphysema, unspecified: Secondary | ICD-10-CM | POA: Diagnosis not present

## 2017-03-24 DIAGNOSIS — J449 Chronic obstructive pulmonary disease, unspecified: Secondary | ICD-10-CM | POA: Diagnosis not present

## 2017-03-25 ENCOUNTER — Other Ambulatory Visit: Payer: Self-pay | Admitting: Adult Health

## 2017-03-25 NOTE — Telephone Encounter (Signed)
Pt needs an appointment for future refills 

## 2017-04-03 DIAGNOSIS — J441 Chronic obstructive pulmonary disease with (acute) exacerbation: Secondary | ICD-10-CM | POA: Diagnosis not present

## 2017-04-03 DIAGNOSIS — I480 Paroxysmal atrial fibrillation: Secondary | ICD-10-CM | POA: Diagnosis not present

## 2017-04-03 DIAGNOSIS — J9611 Chronic respiratory failure with hypoxia: Secondary | ICD-10-CM | POA: Diagnosis not present

## 2017-04-03 DIAGNOSIS — I10 Essential (primary) hypertension: Secondary | ICD-10-CM | POA: Diagnosis not present

## 2017-04-20 DIAGNOSIS — H9202 Otalgia, left ear: Secondary | ICD-10-CM | POA: Diagnosis not present

## 2017-04-24 DIAGNOSIS — J449 Chronic obstructive pulmonary disease, unspecified: Secondary | ICD-10-CM | POA: Diagnosis not present

## 2017-04-24 DIAGNOSIS — J439 Emphysema, unspecified: Secondary | ICD-10-CM | POA: Diagnosis not present

## 2017-05-21 ENCOUNTER — Other Ambulatory Visit: Payer: Self-pay | Admitting: Cardiology

## 2017-05-22 DIAGNOSIS — J439 Emphysema, unspecified: Secondary | ICD-10-CM | POA: Diagnosis not present

## 2017-05-22 DIAGNOSIS — J449 Chronic obstructive pulmonary disease, unspecified: Secondary | ICD-10-CM | POA: Diagnosis not present

## 2017-06-22 DIAGNOSIS — J439 Emphysema, unspecified: Secondary | ICD-10-CM | POA: Diagnosis not present

## 2017-06-22 DIAGNOSIS — J449 Chronic obstructive pulmonary disease, unspecified: Secondary | ICD-10-CM | POA: Diagnosis not present

## 2017-07-22 DIAGNOSIS — J449 Chronic obstructive pulmonary disease, unspecified: Secondary | ICD-10-CM | POA: Diagnosis not present

## 2017-07-22 DIAGNOSIS — J439 Emphysema, unspecified: Secondary | ICD-10-CM | POA: Diagnosis not present

## 2017-08-14 DIAGNOSIS — H9202 Otalgia, left ear: Secondary | ICD-10-CM | POA: Diagnosis not present

## 2017-08-22 DIAGNOSIS — J449 Chronic obstructive pulmonary disease, unspecified: Secondary | ICD-10-CM | POA: Diagnosis not present

## 2017-09-21 DIAGNOSIS — J449 Chronic obstructive pulmonary disease, unspecified: Secondary | ICD-10-CM | POA: Diagnosis not present

## 2017-10-02 DIAGNOSIS — J301 Allergic rhinitis due to pollen: Secondary | ICD-10-CM | POA: Diagnosis not present

## 2017-10-02 DIAGNOSIS — J9611 Chronic respiratory failure with hypoxia: Secondary | ICD-10-CM | POA: Diagnosis not present

## 2017-10-02 DIAGNOSIS — J449 Chronic obstructive pulmonary disease, unspecified: Secondary | ICD-10-CM | POA: Diagnosis not present

## 2017-10-02 DIAGNOSIS — I480 Paroxysmal atrial fibrillation: Secondary | ICD-10-CM | POA: Diagnosis not present

## 2017-10-22 DIAGNOSIS — J449 Chronic obstructive pulmonary disease, unspecified: Secondary | ICD-10-CM | POA: Diagnosis not present

## 2017-11-05 DIAGNOSIS — I10 Essential (primary) hypertension: Secondary | ICD-10-CM | POA: Diagnosis not present

## 2017-11-05 DIAGNOSIS — E782 Mixed hyperlipidemia: Secondary | ICD-10-CM | POA: Diagnosis not present

## 2017-11-05 DIAGNOSIS — Z0001 Encounter for general adult medical examination with abnormal findings: Secondary | ICD-10-CM | POA: Diagnosis not present

## 2017-11-05 DIAGNOSIS — J9610 Chronic respiratory failure, unspecified whether with hypoxia or hypercapnia: Secondary | ICD-10-CM | POA: Diagnosis not present

## 2017-11-05 DIAGNOSIS — J069 Acute upper respiratory infection, unspecified: Secondary | ICD-10-CM | POA: Diagnosis not present

## 2017-11-05 DIAGNOSIS — R972 Elevated prostate specific antigen [PSA]: Secondary | ICD-10-CM | POA: Diagnosis not present

## 2017-11-05 DIAGNOSIS — E559 Vitamin D deficiency, unspecified: Secondary | ICD-10-CM | POA: Diagnosis not present

## 2017-11-05 DIAGNOSIS — Z1389 Encounter for screening for other disorder: Secondary | ICD-10-CM | POA: Diagnosis not present

## 2017-11-09 ENCOUNTER — Other Ambulatory Visit (HOSPITAL_COMMUNITY): Payer: Self-pay | Admitting: Family Medicine

## 2017-11-09 ENCOUNTER — Ambulatory Visit (HOSPITAL_COMMUNITY)
Admission: RE | Admit: 2017-11-09 | Discharge: 2017-11-09 | Disposition: A | Discharge status: Home / Self Care | Payer: Medicare Other | Source: Ambulatory Visit | Attending: Family Medicine | Admitting: Family Medicine

## 2017-11-09 DIAGNOSIS — E782 Mixed hyperlipidemia: Secondary | ICD-10-CM | POA: Diagnosis not present

## 2017-11-09 DIAGNOSIS — I700 Atherosclerosis of aorta: Secondary | ICD-10-CM | POA: Diagnosis not present

## 2017-11-09 DIAGNOSIS — J9610 Chronic respiratory failure, unspecified whether with hypoxia or hypercapnia: Secondary | ICD-10-CM | POA: Diagnosis not present

## 2017-11-09 DIAGNOSIS — J449 Chronic obstructive pulmonary disease, unspecified: Secondary | ICD-10-CM | POA: Diagnosis not present

## 2017-11-09 DIAGNOSIS — R05 Cough: Secondary | ICD-10-CM | POA: Diagnosis not present

## 2017-11-09 DIAGNOSIS — R0602 Shortness of breath: Secondary | ICD-10-CM | POA: Diagnosis not present

## 2017-11-16 ENCOUNTER — Telehealth: Payer: Self-pay

## 2017-11-16 NOTE — Telephone Encounter (Signed)
pts wife came in to schedule her tcs today. While she was in the office, she called the pt to see if he wanted her to schedule him an appointment to set up his tcs. He told her he wanted to think about it and they will call back later to schedule an OV. Pt is on eliquis and will need an OV.  Pts last tcs was on 08/21/10 and he had tubular adenomas. He was sent a letter in 2017 stating it was time to schedule his repeat tcs. He said he doesn't remember if he got the letter or not.   Please schedule the pt an OV when he calls back.

## 2017-11-17 NOTE — Telephone Encounter (Signed)
noted 

## 2017-11-22 DIAGNOSIS — J449 Chronic obstructive pulmonary disease, unspecified: Secondary | ICD-10-CM | POA: Diagnosis not present

## 2017-12-07 ENCOUNTER — Other Ambulatory Visit (HOSPITAL_COMMUNITY): Payer: Self-pay | Admitting: Family Medicine

## 2017-12-07 DIAGNOSIS — Z1389 Encounter for screening for other disorder: Secondary | ICD-10-CM | POA: Diagnosis not present

## 2017-12-07 DIAGNOSIS — J209 Acute bronchitis, unspecified: Secondary | ICD-10-CM | POA: Diagnosis not present

## 2017-12-07 DIAGNOSIS — F17210 Nicotine dependence, cigarettes, uncomplicated: Secondary | ICD-10-CM

## 2017-12-07 DIAGNOSIS — Z23 Encounter for immunization: Secondary | ICD-10-CM | POA: Diagnosis not present

## 2017-12-07 DIAGNOSIS — J449 Chronic obstructive pulmonary disease, unspecified: Secondary | ICD-10-CM | POA: Diagnosis not present

## 2017-12-08 DIAGNOSIS — I10 Essential (primary) hypertension: Secondary | ICD-10-CM | POA: Diagnosis not present

## 2017-12-08 DIAGNOSIS — H35033 Hypertensive retinopathy, bilateral: Secondary | ICD-10-CM | POA: Diagnosis not present

## 2017-12-22 DIAGNOSIS — J449 Chronic obstructive pulmonary disease, unspecified: Secondary | ICD-10-CM | POA: Diagnosis not present

## 2017-12-25 ENCOUNTER — Ambulatory Visit (HOSPITAL_COMMUNITY)
Admission: RE | Admit: 2017-12-25 | Discharge: 2017-12-25 | Disposition: A | Discharge status: Home / Self Care | Payer: Medicare Other | Source: Ambulatory Visit | Attending: Family Medicine | Admitting: Family Medicine

## 2017-12-25 DIAGNOSIS — R918 Other nonspecific abnormal finding of lung field: Secondary | ICD-10-CM | POA: Insufficient documentation

## 2017-12-25 DIAGNOSIS — J432 Centrilobular emphysema: Secondary | ICD-10-CM | POA: Diagnosis not present

## 2017-12-25 DIAGNOSIS — F17210 Nicotine dependence, cigarettes, uncomplicated: Secondary | ICD-10-CM | POA: Diagnosis not present

## 2017-12-25 DIAGNOSIS — N200 Calculus of kidney: Secondary | ICD-10-CM | POA: Insufficient documentation

## 2017-12-25 DIAGNOSIS — I700 Atherosclerosis of aorta: Secondary | ICD-10-CM | POA: Insufficient documentation

## 2017-12-25 DIAGNOSIS — Z122 Encounter for screening for malignant neoplasm of respiratory organs: Secondary | ICD-10-CM | POA: Insufficient documentation

## 2017-12-25 DIAGNOSIS — I2510 Atherosclerotic heart disease of native coronary artery without angina pectoris: Secondary | ICD-10-CM | POA: Diagnosis not present

## 2017-12-25 DIAGNOSIS — Z87891 Personal history of nicotine dependence: Secondary | ICD-10-CM | POA: Diagnosis not present

## 2017-12-28 ENCOUNTER — Other Ambulatory Visit: Payer: Self-pay | Admitting: Cardiology

## 2018-01-22 DIAGNOSIS — J449 Chronic obstructive pulmonary disease, unspecified: Secondary | ICD-10-CM | POA: Diagnosis not present

## 2018-01-25 ENCOUNTER — Other Ambulatory Visit: Payer: Self-pay | Admitting: Cardiology

## 2018-01-29 ENCOUNTER — Other Ambulatory Visit: Payer: Self-pay | Admitting: Cardiology

## 2018-01-29 MED ORDER — DILTIAZEM HCL ER COATED BEADS 180 MG PO CP24: ORAL_CAPSULE | ORAL | 3 refills | Status: DC

## 2018-01-29 NOTE — Telephone Encounter (Signed)
Pt is needing refills on his meds sent in.   He is scheduled to see Dr. Harl Bowie on 03/19/18 in [place], that was first available.

## 2018-01-29 NOTE — Telephone Encounter (Signed)
[facility] appoth not [place], one drug, Diltiazem 180 mg daily, only wants 30 day supply, sent

## 2018-02-04 ENCOUNTER — Telehealth: Payer: Self-pay | Admitting: Cardiology

## 2018-02-04 NOTE — Telephone Encounter (Signed)
error 

## 2018-02-21 DIAGNOSIS — J449 Chronic obstructive pulmonary disease, unspecified: Secondary | ICD-10-CM | POA: Diagnosis not present

## 2018-03-01 ENCOUNTER — Telehealth: Payer: Self-pay | Admitting: Cardiology

## 2018-03-01 NOTE — Telephone Encounter (Signed)
Pt is needing to have teeth extracted and will need to know what to do about his apixaban (ELIQUIS) 5 MG TABS tablet [[id number]]   Please fax to Dr. Laretta Alstrom office [phone]

## 2018-03-01 NOTE — Telephone Encounter (Signed)
Tina from Dr Viona Gilmore office made aware and will inform pt - will also fax this correspondence

## 2018-03-01 NOTE — Telephone Encounter (Signed)
HOld eliquis 2 days prior to procedure, resume day after   Zandra Abts MD

## 2018-03-04 ENCOUNTER — Encounter: Payer: Self-pay | Admitting: *Deleted

## 2018-03-04 ENCOUNTER — Encounter: Payer: Self-pay | Admitting: Cardiology

## 2018-03-04 ENCOUNTER — Ambulatory Visit: Payer: Medicare Other | Admitting: Cardiology

## 2018-03-04 VITALS — BP 114/72 | HR 72 | Ht 74.0 in | Wt 204.8 lb

## 2018-03-04 DIAGNOSIS — I4892 Unspecified atrial flutter: Secondary | ICD-10-CM | POA: Diagnosis not present

## 2018-03-04 NOTE — Patient Instructions (Signed)
Your physician wants you to follow-up in: [place] will receive a reminder letter in the mail two months in advance. If you don't receive a letter, please call our office to schedule the follow-up appointment.  Your physician has recommended you make the following change in your medication:   STOP AMIODARONE   Thank you for choosing [facility]!!

## 2018-03-04 NOTE — Progress Notes (Signed)
Clinical Summary David Stein is a 72 y.o.male seen today for follow up of the following medical problems.   1. Aflutter - on amio,dilt and eliquis   - no recent palpitations - compliant with meds. No bleeding on eliquis    2. COPD - followed by Dr Luan Pulling, on home O2.    Past Medical History:  Diagnosis Date  . Arthritis   . COPD (chronic obstructive pulmonary disease) ([place])   . Kidney stones   . 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 Restless leg syndrome   . Shortness of breath dyspnea      Allergies  Allergen Reactions  . Codeine Other (See Comments)    headache     Current Outpatient Medications  Medication Sig Dispense Refill  . Aclidinium Bromide (TUDORZA PRESSAIR) 400 MCG/ACT AEPB Inhale 1 puff into the lungs daily.    Marland Kitchen albuterol (PROVENTIL) (2.5 MG/3ML) 0.083% nebulizer solution Take 2.5 mg by nebulization every 6 (six) hours as needed for wheezing or shortness of breath.     Marland Kitchen amiodarone (PACERONE) 200 MG tablet Take 1 tablet (200 mg total) by mouth daily. 90 tablet 3  . apixaban (ELIQUIS) 5 MG TABS tablet Take 1 tablet (5 mg total) by mouth 2 (two) times daily. 60 tablet 0  . budesonide-formoterol (SYMBICORT) 160-4.5 MCG/ACT inhaler Inhale 2 puffs into the lungs 2 (two) times daily.    Marland Kitchen diltiazem (CARDIZEM CD) 180 MG 24 hr capsule TAKE (1) CAPSULE BY MOUTH ONCE DAILY. 30 capsule 3  . furosemide (LASIX) 20 MG tablet Take 1 tablet (20 mg total) by mouth daily. (Patient taking differently: Take 20 mg by mouth daily as needed. ) 30 tablet 6  . guaiFENesin (MUCINEX) 600 MG 12 hr tablet Take 1 tablet (600 mg total) by mouth 2 (two) times daily.    . hydrochlorothiazide (HYDRODIURIL) 25 MG tablet Take 1 tablet (25 mg total) by mouth daily. (Patient taking differently: Take 25 mg by mouth daily as needed. ) 90 tablet 3  . Multiple Vitamin (MULTIVITAMIN WITH MINERALS) TABS tablet Take 1 tablet by mouth daily.    Marland Kitchen PROAIR HFA 108 (90 Base) MCG/ACT inhaler Inhale 1-2  puffs into the lungs every 6 (six) hours as needed for wheezing or shortness of breath.     Marland Kitchen rOPINIRole (REQUIP) 0.25 MG tablet Take 1 tablet (0.25 mg total) by mouth at bedtime. 30 tablet 0  . senna-docusate (SENOKOT-S) 8.6-50 MG tablet Take 2 tablets by mouth at bedtime as needed for mild constipation.     No current facility-administered medications for this visit.      Past Surgical History:  Procedure Laterality Date  . CATARACT EXTRACTION W/PHACO Right 01/31/2014   Procedure: CATARACT EXTRACTION PHACO AND INTRAOCULAR LENS PLACEMENT RIGHT EYE CDE=6.90;  Surgeon: Elta Guadeloupe T. Gershon Crane, MD;  Location: [facility];  Service: Ophthalmology;  Laterality: Right;  . CATARACT EXTRACTION W/PHACO Left 02/14/2014   Procedure: CATARACT EXTRACTION PHACO AND INTRAOCULAR LENS PLACEMENT; CDE:  8.34;  Surgeon: Elta Guadeloupe T. Gershon Crane, MD;  Location: [facility];  Service: Ophthalmology;  Laterality: Left;  . KNEE SURGERY Left      Allergies  Allergen Reactions  . Codeine Other (See Comments)    headache      Family History  Problem Relation Age of Onset  . Rheumatic fever Father   . Alzheimer's disease Mother   . Stroke Sister      Social History Mr. Borchers reports that he quit smoking about 2 years  ago. His smoking use included cigarettes. He has a 13.75 pack-year smoking history. He has never used smokeless tobacco. Mr. Keatts reports no history of alcohol use.   Review of Systems CONSTITUTIONAL: No weight loss, fever, chills, weakness or fatigue.  HEENT: Eyes: No visual loss, blurred vision, double vision or yellow sclerae.No hearing loss, sneezing, congestion, runny nose or sore throat.  SKIN: No rash or itching.  CARDIOVASCULAR: per hpi RESPIRATORY: Chronic SOB GASTROINTESTINAL: No anorexia, nausea, vomiting or diarrhea. No abdominal pain or blood.  GENITOURINARY: No burning on urination, no polyuria NEUROLOGICAL: No headache, dizziness, syncope, paralysis, ataxia, numbness or tingling in the  extremities. No change in bowel or bladder control.  MUSCULOSKELETAL: No muscle, back pain, joint pain or stiffness.  LYMPHATICS: No enlarged nodes. No history of splenectomy.  PSYCHIATRIC: No history of depression or anxiety.  ENDOCRINOLOGIC: No reports of sweating, cold or heat intolerance. No polyuria or polydipsia.  Marland Kitchen   Physical Examination Vitals:   03/04/18 1117  BP: 114/72  Pulse: 72  SpO2: 94%   Vitals:   03/04/18 1117  Weight: 204 lb 12.8 oz (92.9 kg)  Height: 6\' 2"  (1.88 m)    Gen: resting comfortably, no acute distress HEENT: no scleral icterus, pupils equal round and reactive, no palptable cervical adenopathy,  CV Resp: Clear to auscultation bilaterally GI: abdomen is soft, non-tender, non-distended, normal bowel sounds, no hepatosplenomegaly MSK: extremities are warm, no edema.  Skin: warm, no rash Neuro:  no focal deficits Psych: appropriate affect   Diagnostic Studies 11/2015 echo Study Conclusions  - Left ventricle: The cavity size was normal. Wall thickness was normal. Systolic function was normal. The estimated ejection fraction was in the range of 55% to 60%. Although no diagnostic regional wall motion abnormality was identified, this possibility cannot be completely excluded on the basis of this study. - Ventricular septum: Septal motion showed abnormal function, dyssynergy, and paradox. The contour showed systolic flattening. These changes are consistent with RV pressure overload.  Impressions:  - The systolic PA pressure could not be estimated due to the absence of TR jet. The 2D findings are suspicious for severely elevated PA pressure.    Assessment and Plan   1. Aflutter - initialy diagnosis in 2017 during pneumonia, was difficult to control inpatient and was started on amio - has done well since then, we will d/c amio and continue just dilt and eliquis   F/u 6 months  Arnoldo Lenis, M.D.

## 2018-03-19 ENCOUNTER — Ambulatory Visit: Payer: Self-pay | Admitting: Cardiology

## 2018-03-24 DIAGNOSIS — J449 Chronic obstructive pulmonary disease, unspecified: Secondary | ICD-10-CM | POA: Diagnosis not present

## 2018-03-25 DIAGNOSIS — J301 Allergic rhinitis due to pollen: Secondary | ICD-10-CM | POA: Diagnosis not present

## 2018-03-25 DIAGNOSIS — J9611 Chronic respiratory failure with hypoxia: Secondary | ICD-10-CM | POA: Diagnosis not present

## 2018-03-25 DIAGNOSIS — I480 Paroxysmal atrial fibrillation: Secondary | ICD-10-CM | POA: Diagnosis not present

## 2018-03-25 DIAGNOSIS — J449 Chronic obstructive pulmonary disease, unspecified: Secondary | ICD-10-CM | POA: Diagnosis not present

## 2018-04-01 ENCOUNTER — Other Ambulatory Visit: Payer: Self-pay | Admitting: Pulmonary Disease

## 2018-04-01 ENCOUNTER — Other Ambulatory Visit (HOSPITAL_COMMUNITY): Payer: Self-pay | Admitting: Pulmonary Disease

## 2018-04-01 DIAGNOSIS — R911 Solitary pulmonary nodule: Secondary | ICD-10-CM

## 2018-04-01 DIAGNOSIS — R0602 Shortness of breath: Secondary | ICD-10-CM

## 2018-04-01 DIAGNOSIS — F17219 Nicotine dependence, cigarettes, with unspecified nicotine-induced disorders: Secondary | ICD-10-CM

## 2018-04-14 ENCOUNTER — Ambulatory Visit (HOSPITAL_COMMUNITY): Payer: Medicare Other

## 2018-04-15 ENCOUNTER — Ambulatory Visit (HOSPITAL_COMMUNITY)
Admission: RE | Admit: 2018-04-15 | Discharge: 2018-04-15 | Disposition: A | Discharge status: Home / Self Care | Payer: Medicare Other | Source: Ambulatory Visit | Attending: Pulmonary Disease | Admitting: Pulmonary Disease

## 2018-04-15 DIAGNOSIS — R0602 Shortness of breath: Secondary | ICD-10-CM | POA: Insufficient documentation

## 2018-04-15 DIAGNOSIS — R911 Solitary pulmonary nodule: Secondary | ICD-10-CM | POA: Insufficient documentation

## 2018-04-15 DIAGNOSIS — J439 Emphysema, unspecified: Secondary | ICD-10-CM | POA: Diagnosis not present

## 2018-04-15 DIAGNOSIS — F17219 Nicotine dependence, cigarettes, with unspecified nicotine-induced disorders: Secondary | ICD-10-CM

## 2018-04-24 DIAGNOSIS — J449 Chronic obstructive pulmonary disease, unspecified: Secondary | ICD-10-CM | POA: Diagnosis not present

## 2018-04-28 ENCOUNTER — Other Ambulatory Visit: Payer: Self-pay | Admitting: Cardiology

## 2018-05-23 DIAGNOSIS — J449 Chronic obstructive pulmonary disease, unspecified: Secondary | ICD-10-CM | POA: Diagnosis not present

## 2018-05-24 ENCOUNTER — Other Ambulatory Visit: Payer: Self-pay | Admitting: Cardiology

## 2018-06-23 DIAGNOSIS — J449 Chronic obstructive pulmonary disease, unspecified: Secondary | ICD-10-CM | POA: Diagnosis not present

## 2018-07-02 DIAGNOSIS — G2581 Restless legs syndrome: Secondary | ICD-10-CM | POA: Diagnosis not present

## 2018-07-02 DIAGNOSIS — Z760 Encounter for issue of repeat prescription: Secondary | ICD-10-CM | POA: Diagnosis not present

## 2018-07-23 DIAGNOSIS — J449 Chronic obstructive pulmonary disease, unspecified: Secondary | ICD-10-CM | POA: Diagnosis not present

## 2018-08-10 DIAGNOSIS — H905 Unspecified sensorineural hearing loss: Secondary | ICD-10-CM | POA: Diagnosis not present

## 2018-08-17 DIAGNOSIS — Z0001 Encounter for general adult medical examination with abnormal findings: Secondary | ICD-10-CM | POA: Diagnosis not present

## 2018-08-17 DIAGNOSIS — J449 Chronic obstructive pulmonary disease, unspecified: Secondary | ICD-10-CM | POA: Diagnosis not present

## 2018-08-17 DIAGNOSIS — Z1389 Encounter for screening for other disorder: Secondary | ICD-10-CM | POA: Diagnosis not present

## 2018-08-17 DIAGNOSIS — M159 Polyosteoarthritis, unspecified: Secondary | ICD-10-CM | POA: Diagnosis not present

## 2018-08-23 DIAGNOSIS — J449 Chronic obstructive pulmonary disease, unspecified: Secondary | ICD-10-CM | POA: Diagnosis not present

## 2018-08-26 ENCOUNTER — Telehealth: Payer: Self-pay | Admitting: Cardiology

## 2018-08-26 NOTE — Telephone Encounter (Signed)
Virtual Visit Pre-Appointment Phone Call  "(Name), I am calling you today to discuss your upcoming appointment. We are currently trying to limit exposure to the virus that causes COVID-19 by seeing patients at home rather than in the office."  1. "What is the BEST phone number to call the day of the visit?" - include this in appointment notes  2. Do you have or have access to (through a family member/friend) a smartphone with video capability that we can use for your visit?" a. If yes - list this number in appt notes as cell (if different from BEST phone #) and list the appointment type as a VIDEO visit in appointment notes b. If no - list the appointment type as a PHONE visit in appointment notes  3. Confirm consent - "In the setting of the current Covid19 crisis, you are scheduled for a (phone or video) visit with your provider on (date) at (time).  Just as we do with many in-office visits, in order for you to participate in this visit, we must obtain consent.  If you'd like, I can send this to your mychart (if signed up) or email for you to review.  Otherwise, I can obtain your verbal consent now.  All virtual visits are billed to your insurance company just like a normal visit would be.  By agreeing to a virtual visit, we'd like you to understand that the technology does not allow for your provider to perform an examination, and thus may limit your provider's ability to fully assess your condition. If your provider identifies any concerns that need to be evaluated in person, we will make arrangements to do so.  Finally, though the technology is pretty good, we cannot assure that it will always work on either your or our end, and in the setting of a video visit, we may have to convert it to a phone-only visit.  In either situation, we cannot ensure that we have a secure connection.  Are you willing to proceed?" STAFF: Did the patient verbally acknowledge consent to telehealth visit? Document  YES/NO here: Yes  4. Advise patient to be prepared - "Two hours prior to your appointment, go ahead and check your blood pressure, pulse, oxygen saturation, and your weight (if you have the equipment to check those) and write them all down. When your visit starts, your provider will ask you for this information. If you have an Apple Watch or Kardia device, please plan to have heart rate information ready on the day of your appointment. Please have a pen and paper handy nearby the day of the visit as well."  5. Give patient instructions for MyChart download to smartphone OR Doximity/Doxy.me as below if video visit (depending on what platform provider is using)  6. Inform patient they will receive a phone call 15 minutes prior to their appointment time (may be from unknown caller ID) so they should be prepared to answer    TELEPHONE CALL NOTE  David Stein has been deemed a candidate for a follow-up tele-health visit to limit community exposure during the Covid-19 pandemic. I spoke with the patient via phone to ensure availability of phone/video source, confirm preferred email & phone number, and discuss instructions and expectations.  I reminded David Stein to be prepared with any vital sign and/or heart rhythm information that could potentially be obtained via home monitoring, at the time of his visit. I reminded David Stein to expect a phone call prior to  his visit.  David Stein 08/26/2018 11:12 AM   INSTRUCTIONS FOR DOWNLOADING THE MYCHART APP TO SMARTPHONE  - The patient must first make sure to have activated MyChart and know their login information - If Apple, go to [employer] and type in MyChart in the search bar and download the app. If Android, ask patient to go to [employer] and type in [facility] in the search bar and download the app. The app is free but as with any other app downloads, their phone may require them to verify saved payment information or Apple/Android  password.  - The patient will need to then log into the app with their MyChart username and password, and select [facility] as their healthcare provider to link the account. When it is time for your visit, go to the MyChart app, find appointments, and click Begin Video Visit. Be sure to Select Allow for your device to access the Microphone and Camera for your visit. You will then be connected, and your provider will be with you shortly.  **If they have any issues connecting, or need assistance please contact MyChart service desk [phone])**  **If using a computer, in order to ensure the best quality for their visit they will need to use either of the following Internet Browsers: [employer], or Google Chrome**  IF USING DOXIMITY or DOXY.ME - The patient will receive a link just prior to their visit by text.     FULL LENGTH CONSENT FOR TELE-HEALTH VISIT   I hereby voluntarily request, consent and authorize [facility] and its employed or contracted physicians, physician assistants, nurse practitioners or other licensed health care professionals (the Practitioner), to provide me with telemedicine health care services (the Services") as deemed necessary by the treating Practitioner. I acknowledge and consent to receive the Services by the Practitioner via telemedicine. I understand that the telemedicine visit will involve communicating with the Practitioner through live audiovisual communication technology and the disclosure of certain medical information by electronic transmission. I acknowledge that I have been given the opportunity to request an in-person assessment or other available alternative prior to the telemedicine visit and am voluntarily participating in the telemedicine visit.  I understand that I have the right to withhold or withdraw my consent to the use of telemedicine in the course of my care at any time, without affecting my right to future care or treatment,  and that the Practitioner or I may terminate the telemedicine visit at any time. I understand that I have the right to inspect all information obtained and/or recorded in the course of the telemedicine visit and may receive copies of available information for a reasonable fee.  I understand that some of the potential risks of receiving the Services via telemedicine include:   Delay or interruption in medical evaluation due to technological equipment failure or disruption;  Information transmitted may not be sufficient (e.g. poor resolution of images) to allow for appropriate medical decision making by the Practitioner; and/or   In rare instances, security protocols could fail, causing a breach of personal health information.  Furthermore, I acknowledge that it is my responsibility to provide information about my medical history, conditions and care that is complete and accurate to the best of my ability. I acknowledge that Practitioner's advice, recommendations, and/or decision may be based on factors not within their control, such as incomplete or inaccurate data provided by me or distortions of diagnostic images or specimens that may result from electronic transmissions. I  understand that the practice of medicine is not an exact science and that Practitioner makes no warranties or guarantees regarding treatment outcomes. I acknowledge that I will receive a copy of this consent concurrently upon execution via email to the email address I last provided but may also request a printed copy by calling the office of [facility] [facility].    I understand that my insurance will be billed for this visit.   I have read or had this consent read to me.  I understand the contents of this consent, which adequately explains the benefits and risks of the Services being provided via telemedicine.   I have been provided ample opportunity to ask questions regarding this consent and the Services and have had my questions  answered to my satisfaction.  I give my informed consent for the services to be provided through the use of telemedicine in my medical care  By participating in this telemedicine visit I agree to the above.

## 2018-09-01 ENCOUNTER — Encounter: Payer: Self-pay | Admitting: Cardiology

## 2018-09-01 ENCOUNTER — Telehealth (INDEPENDENT_AMBULATORY_CARE_PROVIDER_SITE_OTHER): Payer: Medicare Other | Admitting: Cardiology

## 2018-09-01 VITALS — BP 110/64 | HR 76 | Temp 97.6°F | Ht 71.0 in | Wt 212.0 lb

## 2018-09-01 DIAGNOSIS — I4892 Unspecified atrial flutter: Secondary | ICD-10-CM

## 2018-09-01 NOTE — Progress Notes (Signed)
Virtual Visit via Video Note   This visit type was conducted due to national recommendations for restrictions regarding the COVID-19 Pandemic (e.g. social distancing) in an effort to limit this patient's exposure and mitigate transmission in our community.  Due to his co-morbid illnesses, this patient is at least at moderate risk for complications without adequate follow up.  This format is felt to be most appropriate for this patient at this time.  All issues noted in this document were discussed and addressed.  A limited physical exam was performed with this format.  Please refer to the patient's chart for his consent to telehealth for [facility].   Date:  09/01/2018   ID:  David Stein, DOB Jul 30, 1945, MRN [id number]  Patient Location: Home Provider Location: Office  [facility]P:  [facility]  Cardiologist:  Carlyle Dolly, MD  Electrophysiologist:  None   Evaluation Performed:  Follow-Up Visit  Chief Complaint:  6 month follow up  History of Present Illness:    David Stein is a 73 y.o. male seen today for follow up of the following medical problems.  1. Aflutter - we previously stopped amio as he was doing well, initially started during admission in 2017 with pneumonia where his aflutter was difficult to control   - some recent palpitations. Occurs ever 3-4 days, lasts 10-15 seconds. Has noted by his pulse ox has had some irregular beats. Overall not bothersome. Hearts 65-80s by pulse ox on the regular - compliant with eliquis, no bleeding troubles.     2. COPD - followed by Dr Luan Pulling, on home O2.       The patient does not have symptoms concerning for COVID-19 infection (fever, chills, cough, or new shortness of breath).    Past Medical History:  Diagnosis Date  . Arthritis   . COPD (chronic obstructive pulmonary disease) ([place])   . Kidney stones   . 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 Restless leg syndrome   . Shortness of breath dyspnea    Past Surgical History:  Procedure Laterality Date  . CATARACT EXTRACTION W/PHACO Right 01/31/2014   Procedure: CATARACT EXTRACTION PHACO AND INTRAOCULAR LENS PLACEMENT RIGHT EYE CDE=6.90;  Surgeon: Elta Guadeloupe T. Gershon Crane, MD;  Location: [facility];  Service: Ophthalmology;  Laterality: Right;  . CATARACT EXTRACTION W/PHACO Left 02/14/2014   Procedure: CATARACT EXTRACTION PHACO AND INTRAOCULAR LENS PLACEMENT; CDE:  8.34;  Surgeon: Elta Guadeloupe T. Gershon Crane, MD;  Location: [facility];  Service: Ophthalmology;  Laterality: Left;  . KNEE SURGERY Left      No outpatient medications have been marked as taking for the 09/01/18 encounter (Appointment) with Arnoldo Lenis, MD.     Allergies:   Codeine   Social History   Tobacco Use  . Smoking status: Former Smoker    Packs/day: 0.25    Years: 55.00    Pack years: 13.75    Types: Cigarettes    Last attempt to quit: 12/12/2015    Years since quitting: 2.7  . Smokeless tobacco: Never Used  Substance Use Topics  . Alcohol use: No  . Drug use: No     Family Hx: The patient's family history includes Alzheimer's disease in his mother; Rheumatic fever in his father; Stroke in his sister.  ROS:   Please see the history of present illness.     All other systems reviewed and are negative.   Prior CV studies:   The following studies were reviewed today:  11/2015 echo Study Conclusions  -  Left ventricle: The cavity size was normal. Wall thickness was normal. Systolic function was normal. The estimated ejection fraction was in the range of 55% to 60%. Although no diagnostic regional wall motion abnormality was identified, this possibility cannot be completely excluded on the basis of this study. - Ventricular septum: Septal motion showed abnormal function, dyssynergy, and paradox. The contour showed systolic flattening. These changes are consistent with RV pressure overload.  Impressions:  - The systolic PA pressure could not be estimated  due to the absence of TR jet. The 2D findings are suspicious for severely elevated PA pressure.  Labs/Other Tests and Data Reviewed:    EKG:  No ECG reviewed.  Recent Labs: No results found for requested labs within last 8760 hours.   Recent Lipid Panel No results found for: CHOL, TRIG, HDL, CHOLHDL, LDLCALC, LDLDIRECT  Wt Readings from Last 3 Encounters:  03/04/18 204 lb 12.8 oz (92.9 kg)  04/15/16 198 lb (89.8 kg)  01/11/16 190 lb (86.2 kg)     Objective:    Vital Signs:   Today's Vitals   09/01/18 0915  BP: 110/64  Pulse: 76  Temp: 97.6 F (36.4 C)  SpO2: 96%  Weight: 212 lb (96.2 kg)  Height: 5\' 11"  (1.803 m)   Body mass index is 29.57 kg/m.  Well nourished elderly male sitting comfortable in on apparent distress. Normal affect. Normal speech pattern and tone. No audible or visual signs of SOB or wheezing.   ASSESSMENT & PLAN:    1. Aflutter - overall doing well, mild infrequent symptoms. Continue to monitor, room to titrate diltiazem if needed - continue eliquis for stroke prevention   F/u 6 months  COVID-19 Education: The signs and symptoms of COVID-19 were discussed with the patient and how to seek care for testing (follow up with PCP or arrange E-visit).  The importance of social distancing was discussed today.  Time:   Today, I have spent 12 minutes with the patient with telehealth technology discussing the above problems.     Medication Adjustments/Labs and Tests Ordered: Current medicines are reviewed at length with the patient today.  Concerns regarding medicines are outlined above.   Tests Ordered: No orders of the defined types were placed in this encounter.   Medication Changes: No orders of the defined types were placed in this encounter.   Disposition:  Follow up 6 months  Signed, Carlyle Dolly, MD  09/01/2018 8:16 AM    [facility]

## 2018-09-01 NOTE — Patient Instructions (Signed)

## 2018-09-07 DIAGNOSIS — E782 Mixed hyperlipidemia: Secondary | ICD-10-CM | POA: Diagnosis not present

## 2018-09-07 DIAGNOSIS — E559 Vitamin D deficiency, unspecified: Secondary | ICD-10-CM | POA: Diagnosis not present

## 2018-09-07 DIAGNOSIS — I10 Essential (primary) hypertension: Secondary | ICD-10-CM | POA: Diagnosis not present

## 2018-09-07 DIAGNOSIS — J449 Chronic obstructive pulmonary disease, unspecified: Secondary | ICD-10-CM | POA: Diagnosis not present

## 2018-09-07 DIAGNOSIS — M159 Polyosteoarthritis, unspecified: Secondary | ICD-10-CM | POA: Diagnosis not present

## 2018-09-07 DIAGNOSIS — J9610 Chronic respiratory failure, unspecified whether with hypoxia or hypercapnia: Secondary | ICD-10-CM | POA: Diagnosis not present

## 2018-09-07 DIAGNOSIS — E039 Hypothyroidism, unspecified: Secondary | ICD-10-CM | POA: Diagnosis not present

## 2018-09-07 DIAGNOSIS — M1040 Other secondary gout, unspecified site: Secondary | ICD-10-CM | POA: Diagnosis not present

## 2018-09-07 DIAGNOSIS — E7849 Other hyperlipidemia: Secondary | ICD-10-CM | POA: Diagnosis not present

## 2018-09-22 DIAGNOSIS — J449 Chronic obstructive pulmonary disease, unspecified: Secondary | ICD-10-CM | POA: Diagnosis not present

## 2018-10-23 DIAGNOSIS — J449 Chronic obstructive pulmonary disease, unspecified: Secondary | ICD-10-CM | POA: Diagnosis not present

## 2018-11-10 DIAGNOSIS — H35033 Hypertensive retinopathy, bilateral: Secondary | ICD-10-CM | POA: Diagnosis not present

## 2018-11-23 DIAGNOSIS — J449 Chronic obstructive pulmonary disease, unspecified: Secondary | ICD-10-CM | POA: Diagnosis not present

## 2018-12-23 DIAGNOSIS — J449 Chronic obstructive pulmonary disease, unspecified: Secondary | ICD-10-CM | POA: Diagnosis not present

## 2019-01-13 DIAGNOSIS — Z961 Presence of intraocular lens: Secondary | ICD-10-CM | POA: Diagnosis not present

## 2019-01-13 DIAGNOSIS — H26493 Other secondary cataract, bilateral: Secondary | ICD-10-CM | POA: Diagnosis not present

## 2019-01-23 DIAGNOSIS — J449 Chronic obstructive pulmonary disease, unspecified: Secondary | ICD-10-CM | POA: Diagnosis not present

## 2019-02-22 DIAGNOSIS — J449 Chronic obstructive pulmonary disease, unspecified: Secondary | ICD-10-CM | POA: Diagnosis not present

## 2019-03-09 DIAGNOSIS — E039 Hypothyroidism, unspecified: Secondary | ICD-10-CM | POA: Diagnosis not present

## 2019-03-09 DIAGNOSIS — G4709 Other insomnia: Secondary | ICD-10-CM | POA: Diagnosis not present

## 2019-03-09 DIAGNOSIS — R609 Edema, unspecified: Secondary | ICD-10-CM | POA: Diagnosis not present

## 2019-05-09 ENCOUNTER — Other Ambulatory Visit: Payer: Self-pay | Admitting: Cardiology

## 2019-07-25 ENCOUNTER — Encounter: Payer: Self-pay | Admitting: Cardiology

## 2019-07-25 ENCOUNTER — Other Ambulatory Visit: Payer: Self-pay

## 2019-07-25 ENCOUNTER — Ambulatory Visit: Payer: Medicare Other | Admitting: Cardiology

## 2019-07-25 VITALS — BP 128/80 | HR 83 | Ht 72.0 in | Wt 217.0 lb

## 2019-07-25 DIAGNOSIS — J449 Chronic obstructive pulmonary disease, unspecified: Secondary | ICD-10-CM | POA: Diagnosis not present

## 2019-07-25 DIAGNOSIS — I4892 Unspecified atrial flutter: Secondary | ICD-10-CM | POA: Diagnosis not present

## 2019-07-25 DIAGNOSIS — R600 Localized edema: Secondary | ICD-10-CM

## 2019-07-25 MED ORDER — DILTIAZEM HCL ER COATED BEADS 240 MG PO CP24
240.0000 mg | ORAL_CAPSULE | Freq: Every day | ORAL | 1 refills | Status: DC
Start: 2019-07-25 — End: 2019-10-21

## 2019-07-25 MED ORDER — FUROSEMIDE 20 MG PO TABS: ORAL_TABLET | ORAL | 1 refills | Status: DC

## 2019-07-25 NOTE — Progress Notes (Signed)
Clinical Summary David Stein is a 74 y.o.male  seen today for follow up of the following medical problems.  1. Aflutter - we previously stopped amio as he was doing well, initially started during admission in 2017 with pneumonia where his aflutter was difficult to control   - can have some palpitations at times. Last about 30 seconds, occurs about once daily. Often triggered by anxiety or stress.  - no bleeding on eliquis   2. COPD - followed by Dr Luan Pulling, on home O2.  3. LE edema  - several year history, swelling is up and down.  - compliant with lasix, uses compression as well  Completed covid vaccine x 2    Past Medical History:  Diagnosis Date  . Arthritis   . COPD (chronic obstructive pulmonary disease) ([place])   . Kidney stones   . 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 Restless leg syndrome   . Shortness of breath dyspnea      Allergies  Allergen Reactions  . Codeine Other (See Comments)    headache     Current Outpatient Medications  Medication Sig Dispense Refill  . acetaminophen (TYLENOL) 500 MG tablet Take 500 mg by mouth every 6 (six) hours as needed.    Marland Kitchen albuterol (PROVENTIL) (2.5 MG/3ML) 0.083% nebulizer solution Take 2.5 mg by nebulization every 6 (six) hours as needed for wheezing or shortness of breath.     Marland Kitchen antiseptic oral rinse (BIOTENE) LIQD 15 mLs by Mouth Rinse route as needed for dry mouth.    Marland Kitchen apixaban (ELIQUIS) 5 MG TABS tablet Take 1 tablet (5 mg total) by mouth 2 (two) times daily. 60 tablet 0  . budesonide-formoterol (SYMBICORT) 160-4.5 MCG/ACT inhaler Inhale 2 puffs into the lungs 2 (two) times daily.    Marland Kitchen diltiazem (CARDIZEM CD) 180 MG 24 hr capsule TAKE (1) CAPSULE BY MOUTH ONCE DAILY. 90 capsule 3  . furosemide (LASIX) 20 MG tablet Take 1 tablet (20 mg total) by mouth daily. (Patient taking differently: Take 20 mg by mouth daily as needed. ) 30 tablet 6  . guaiFENesin (MUCINEX) 600 MG 12 hr tablet Take 1 tablet (600 mg total) by  mouth 2 (two) times daily.    Marland Kitchen levothyroxine (SYNTHROID, LEVOTHROID) 50 MCG tablet Take 50 mcg by mouth daily before breakfast.    . Multiple Vitamin (MULTIVITAMIN WITH MINERALS) TABS tablet Take 1 tablet by mouth daily.    . polyethylene glycol (MIRALAX / GLYCOLAX) packet Take 17 g by mouth daily.    Marland Kitchen PROAIR HFA 108 (90 Base) MCG/ACT inhaler Inhale 1-2 puffs into the lungs every 6 (six) hours as needed for wheezing or shortness of breath.     . senna (SENOKOT) 8.6 MG tablet Take 1 tablet by mouth 2 (two) times daily.    Marland Kitchen tiotropium (SPIRIVA) 18 MCG inhalation capsule Place 18 mcg into inhaler and inhale daily.    . traZODone (DESYREL) 50 MG tablet Take 50 mg by mouth at bedtime.     No current facility-administered medications for this visit.     Past Surgical History:  Procedure Laterality Date  . CATARACT EXTRACTION W/PHACO Right 01/31/2014   Procedure: CATARACT EXTRACTION PHACO AND INTRAOCULAR LENS PLACEMENT RIGHT EYE CDE=6.90;  Surgeon: Elta Guadeloupe T. Gershon Crane, MD;  Location: [facility];  Service: Ophthalmology;  Laterality: Right;  . CATARACT EXTRACTION W/PHACO Left 02/14/2014   Procedure: CATARACT EXTRACTION PHACO AND INTRAOCULAR LENS PLACEMENT; CDE:  8.34;  Surgeon: Elta Guadeloupe T. Gershon Crane, MD;  Location: [facility];  Service: Ophthalmology;  Laterality: Left;  . KNEE SURGERY Left      Allergies  Allergen Reactions  . Codeine Other (See Comments)    headache      Family History  Problem Relation Age of Onset  . Rheumatic fever Father   . Alzheimer's disease Mother   . Stroke Sister      Social History David Stein reports that he quit smoking about 3 years ago. His smoking use included cigarettes. He has a 13.75 pack-year smoking history. He has never used smokeless tobacco. David Stein reports no history of alcohol use.   Review of Systems CONSTITUTIONAL: No weight loss, fever, chills, weakness or fatigue.  HEENT: Eyes: No visual loss, blurred vision, double vision or yellow  sclerae.No hearing loss, sneezing, congestion, runny nose or sore throat.  SKIN: No rash or itching.  CARDIOVASCULAR:  Per hpi RESPIRATORY: No shortness of breath, cough or sputum.  GASTROINTESTINAL: No anorexia, nausea, vomiting or diarrhea. No abdominal pain or blood.  GENITOURINARY: No burning on urination, no polyuria NEUROLOGICAL: No headache, dizziness, syncope, paralysis, ataxia, numbness or tingling in the extremities. No change in bowel or bladder control.  MUSCULOSKELETAL: No muscle, back pain, joint pain or stiffness.  LYMPHATICS: No enlarged nodes. No history of splenectomy.  PSYCHIATRIC: No history of depression or anxiety.  ENDOCRINOLOGIC: No reports of sweating, cold or heat intolerance. No polyuria or polydipsia.  Marland Kitchen   Physical Examination Today's Vitals   07/25/19 1309  BP: 128/80  Pulse: 83  SpO2: 94%  Weight: 217 lb (98.4 kg)  Height: 6' (1.829 m)   Body mass index is 29.43 kg/m.  Gen: resting comfortably, no acute distress HEENT: no scleral icterus, pupils equal round and reactive, no palptable cervical adenopathy,  CV: RRR, no m/r/g no jvd Resp: Clear to auscultation bilaterally GI: abdomen is soft, non-tender, non-distended, normal bowel sounds, no hepatosplenomegaly MSK: extremities are warm, no edema.  Skin: warm, no rash Neuro:  no focal deficits Psych: appropriate affect   Diagnostic Studies  11/2015 echo Study Conclusions  - Left ventricle: The cavity size was normal. Wall thickness was normal. Systolic function was normal. The estimated ejection fraction was in the range of 55% to 60%. Although no diagnostic regional wall motion abnormality was identified, this possibility cannot be completely excluded on the basis of this study. - Ventricular septum: Septal motion showed abnormal function, dyssynergy, and paradox. The contour showed systolic flattening. These changes are consistent with RV pressure  overload.  Impressions:  - The systolic PA pressure could not be estimated due to the absence of TR jet. The 2D findings are suspicious for severely elevated PA pressure.    Assessment and Plan  1. Aflutter -mild palpitations, though mainly seem to be anxiety driven - try increasing diltiazem to 240mg  daily - continue eliquis  2. COPD - previously followed by Dr Luan Pulling, needs to establish with [facility], we will refer  3. LE edema - continue lasix, change Rx to 20mg  daily may take additioanl 20mg  as needed for swelling.    F/u 6 months      Arnoldo Lenis, M.D.

## 2019-07-25 NOTE — Patient Instructions (Signed)
Your physician wants you to follow-up in: [place] will receive a reminder letter in the mail two months in advance. If you don't receive a letter, please call our office to schedule the follow-up appointment.  Your physician has recommended you make the following change in your medication:   INCREASE DILTIAZEM 240 MG DAILY   TAKE LASIX 20 MG DAILY - MAY TAKE ADDITIONAL 20 MG AS NEEDED FOR SWELLING  You have been referred to DR [facility] PULMONOLOGIST   Thank you for choosing [facility]!!

## 2019-07-26 ENCOUNTER — Encounter: Payer: Self-pay | Admitting: *Deleted

## 2019-08-24 ENCOUNTER — Encounter: Payer: Self-pay | Admitting: Internal Medicine

## 2019-08-24 ENCOUNTER — Ambulatory Visit: Payer: Medicare Other | Admitting: Internal Medicine

## 2019-08-24 ENCOUNTER — Other Ambulatory Visit: Payer: Self-pay

## 2019-08-24 DIAGNOSIS — J9612 Chronic respiratory failure with hypercapnia: Secondary | ICD-10-CM | POA: Diagnosis not present

## 2019-08-24 DIAGNOSIS — J449 Chronic obstructive pulmonary disease, unspecified: Secondary | ICD-10-CM | POA: Diagnosis not present

## 2019-08-24 DIAGNOSIS — J9611 Chronic respiratory failure with hypoxia: Secondary | ICD-10-CM

## 2019-08-24 MED ORDER — AZITHROMYCIN 250 MG PO TABS: ORAL_TABLET | ORAL | 5 refills | Status: DC

## 2019-08-24 MED ORDER — SPIRIVA RESPIMAT 2.5 MCG/ACT IN AERS: 2.0000 | INHALATION_SPRAY | Freq: Every day | RESPIRATORY_TRACT | 0 refills | Status: DC

## 2019-08-24 NOTE — Assessment & Plan Note (Addendum)
Quit smoking 2017 with hx of "bronchitis all his life" - 08/24/2019  After extensive coaching inhaler device,  effectiveness =    75% so continue symb/spriva smi    Group D in terms of symptom/risk and laba/lama/ICS  therefore appropriate rx at this point >>> symbicort/ spiriva reasonable for now but may benefit from [facility] for simplicity/cost in long run.  >>> added prn zmax for flares as has been hepful in past   Advised: I spent extra time with pt today reviewing appropriate use of albuterol for prn use on exertion with the following points: 1) saba is for relief of sob that does not improve by walking a slower pace or resting but rather if the pt does not improve after trying this first. 2) If the pt is convinced, as many are, that saba helps recover from activity faster then it's easy to tell if this is the case by re-challenging : ie stop, take the inhaler, then p 5 minutes try the exact same activity (intensity of workload) that just caused the symptoms and see if they are substantially diminished or not after saba 3) if there is an activity that reproducibly causes the symptoms, try the saba 15 min before the activity on alternate days   If in fact the saba really does help, then fine to continue to use it prn but advised may need to look closer at the maintenance regimen being used to achieve better control of airways disease with exertion.

## 2019-08-24 NOTE — Assessment & Plan Note (Addendum)
02 dep since his 28s - HC03  12/27/2015  = 40   rx as of 08/24/2019 = 3lpm 24/7 except 4lpm with activity   Advised: Make sure you check your oxygen saturations at highest level of activity to be sure it stays over 90% and adjust upward to maintain this level if needed but remember to turn it back to previous settings when you stop (to conserve your supply).        >>> f/u q 3 m for now    Each maintenance medication was reviewed in detail including emphasizing most importantly the difference between maintenance and prns and under what circumstances the prns are to be triggered using an action plan format where appropriate.  Total time for H and P, chart review, counseling, teaching device and generating customized AVS unique to this office visit / charting = 60 min

## 2019-08-24 NOTE — Progress Notes (Signed)
David Stein, male    DOB: 09-Nov-1945, 74 y.o.   MRN: [id number]   Brief patient profile:  74 yowm quit smoking 2017  with "bronchitis" hx as child and  never really got over it or had nl aerobic activity but played basketball in HS and started using some albuterol as needed in his 60s and then symbicort/spiriva maint since later 60s helps some and referred to pulmonary clinic 08/24/2019 by Dr  David Stein at [facility]      History of Present Illness  08/24/2019  Pulmonary/ 1st office eval/David Stein s/p moderna vaccination for covid 19  Chief Complaint  Patient presents with  . Pulmonary Consult    Referred by Dr. Carlyle Stein. Pt states dx with COPD in 2009. He c/o SOB since then and has had a sore throat x 1 wk. Recently took zpack and this helped some.   Dyspnea:  50 ft on 4lpm pulse with sats in 90s Cough: none  Sleep: flat in bed one pillow SABA use: 1st thing then gets in power chair all day / only hfa, not neb  Never prechallenges or rechallenges    No obvious day to day or daytime variability or assoc excess/ purulent sputum or mucus plugs or hemoptysis or cp or chest tightness, subjective wheeze or overt sinus or hb symptoms.   S;ee[omg as abpve without nocturnal  or early am exacerbation  of respiratory  c/o's or need for noct saba. Also denies any obvious fluctuation of symptoms with weather or environmental changes or other aggravating or alleviating factors except as outlined above   No unusual exposure hx or h/o childhood pna/ asthma or knowledge of premature birth.  Current Allergies, Complete Past Medical History, Past Surgical History, Family History, and Social History were reviewed in [employer] record.  ROS  The following are not active complaints unless bolded Hoarseness, sore throat, dysphagia, dental problems, itching, sneezing,  nasal congestion or discharge of excess mucus or purulent secretions, ear ache,   fever, chills, sweats,  unintended wt loss or wt gain, classically pleuritic or exertional cp,  orthopnea pnd or arm/hand swelling  or leg swelling, presyncope, palpitations, abdominal pain, anorexia, nausea, vomiting, diarrhea  or change in bowel habits or change in bladder habits, change in stools or change in urine, dysuria, hematuria,  rash, arthralgias, visual complaints, headache, numbness, weakness or ataxia or problems with walking or coordination,  change in mood or  memory.              Past Medical History:  Diagnosis Date  . Arthritis   . COPD (chronic obstructive pulmonary disease) ([place])   . Kidney stones   . 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 Restless leg syndrome   . Shortness of breath dyspnea     Outpatient Medications Prior to Visit  Medication Sig Dispense Refill  . acetaminophen (TYLENOL) 500 MG tablet Take 500 mg by mouth every 6 (six) hours as needed.    Marland Kitchen albuterol (PROVENTIL) (2.5 MG/3ML) 0.083% nebulizer solution Take 2.5 mg by nebulization every 6 (six) hours as needed for wheezing or shortness of breath.     . ALPRAZOLAM PO Take 1 tablet by mouth as needed.    Marland Kitchen antiseptic oral rinse (BIOTENE) LIQD 15 mLs by Mouth Rinse route as needed for dry mouth.    Marland Kitchen apixaban (ELIQUIS) 5 MG TABS tablet Take 1 tablet (5 mg total) by mouth 2 (two) times daily. 60 tablet 0  .  budesonide-formoterol (SYMBICORT) 160-4.5 MCG/ACT inhaler Inhale 2 puffs into the lungs 2 (two) times daily.    Marland Kitchen diltiazem (CARDIZEM CD) 240 MG 24 hr capsule Take 1 capsule (240 mg total) by mouth daily. 90 capsule 1  . ferrous sulfate 325 (65 FE) MG tablet Take 325 mg by mouth daily with breakfast.    . furosemide (LASIX) 20 MG tablet TAKE 1 TABLET DAILY - MAY TAKE ADDITIONAL 20 MG AS NEEDED FOR SWELLING 90 tablet 1  . guaiFENesin (MUCINEX) 600 MG 12 hr tablet Take 1 tablet (600 mg total) by mouth 2 (two) times daily.    Marland Kitchen levothyroxine (SYNTHROID, LEVOTHROID) 50 MCG tablet Take 50 mcg by mouth daily before breakfast.    .  Multiple Vitamin (MULTIVITAMIN WITH MINERALS) TABS tablet Take 1 tablet by mouth daily.    Marland Kitchen PROAIR HFA 108 (90 Base) MCG/ACT inhaler Inhale 1-2 puffs into the lungs every 6 (six) hours as needed for wheezing or shortness of breath.     Marland Kitchen  spiriva smi 2 puffs each am     . traZODone (DESYREL) 50 MG tablet Take 50 mg by mouth at bedtime.    Marland Kitchen VITAMIN D PO Take 1 tablet by mouth daily.    . polyethylene glycol (MIRALAX / GLYCOLAX) packet Take 17 g by mouth daily.    Marland Kitchen senna (SENOKOT) 8.6 MG tablet Take 1 tablet by mouth 2 (two) times daily.        Objective:     BP 112/60 (BP Location: Left Arm, Cuff Size: Normal)   Pulse 90   Temp 98.5 F (36.9 C) (Temporal)   Ht 6' (1.829 m)   Wt 217 lb (98.4 kg)   SpO2 95% Comment: 3LPM PULSED  BMI 29.43 kg/m   SpO2: 95 %(3LPM PULSED) O2 Type: Pulse O2 O2 Flow Rate (L/min): 3 L/min   Elderly wm very hard of hearing  HEENT : pt wearing mask not removed for exam due to covid -19 concerns.    NECK :  without JVD/Nodes/TM/ nl carotid upstrokes bilaterally   LUNGS: no acc muscle use,  Mod barrel  contour chest wall with bilateral  Distant bs s audible wheeze and  without cough on insp or exp maneuvers and mod  Hyperresonant  to  percussion bilaterally     CV:  RRR  no s3 or murmur or increase in P2, and no edema   ABD:  soft and nontender with pos mid insp Hoover's  in the supine position. No bruits or organomegaly appreciated, bowel sounds nl  MS:     ext warm without deformities, calf tenderness, cyanosis or clubbing No obvious joint restrictions   SKIN: warm and dry without lesions    NEURO:  alert, approp, nl sensorium with  no motor or cerebellar deficits apparent.        I personally reviewed images and agree with radiology impression as follows:   Chest CT LCS  04/15/2018 based on the size of the largest of today's pulmonary nodules, today's study is categorized as a Lung-RADS 4B, suspicious examination. However, given the  overall spectrum of findings on both today's study and the prior study, and the progression of what appears to be most compatible with chronic indolent atypical infection such as mycobacterium avium intracellulare (MAI), this patient is not considered a suitable candidate for future lung cancer screening due to the high likelihood of false-positive examinations and repeated unnecessary testing.      Assessment   No problem-specific Assessment &  Plan notes found for this encounter.     Christinia Gully, MD 08/24/2019

## 2019-08-24 NOTE — Patient Instructions (Signed)
Plan A = Automatic = Always=    symbicort 160 x 2 puffs followed by spiriva x 2 pffs and then 12 hours later repeat the symbicort 160 x 2   Work on inhaler technique:  relax and gently blow all the way out then take a nice smooth deep breath back in, triggering the inhaler at same time you start breathing in.  Hold for up to 5 seconds if you can. Blow symbicort  out thru nose. Rinse and gargle with water when done    Plan B = Backup (to supplement plan A, not to replace it) Only use your albuterol inhaler as a rescue medication to be used if you can't catch your breath by resting or doing a relaxed purse lip breathing pattern.  - The less you use it, the better it will work when you need it. - Ok to use the inhaler up to 2 puffs  every 4 hours if you must but call for appointment if use goes up over your usual need - Don't leave home without it !!  (think of it like the spare tire for your car)   Plan C = Crisis (instead of Plan B but only if Plan B stops working) - only use your albuterol nebulizer if you first try Plan B and it fails to help > ok to use the nebulizer up to every 4 hours but if start needing it regularly call for immediate appointment   Try albuterol 15 min before an activity that you know would make you short of breath and see if it makes any difference and if makes none then don't take it after activity unless you can't catch your breath.      Anytime you have a cough or sorethroat brewing:  Try prilosec otc 20mg   Take 30-60 min before first meal of the day and Pepcid ac (famotidine) 20 mg one after supper until cough/sorethroat are  completely gone for at least a week without the need for cough suppression  GERD (REFLUX)  is an extremely common cause of respiratory symptoms just like yours , many times with no obvious heartburn at all.    It can be treated with medication, but also with lifestyle changes including elevation of the head of your bed (ideally with 6 -8inch  blocks under the headboard of your bed),  Smoking cessation, avoidance of late meals, excessive alcohol, and avoid fatty foods, chocolate, peppermint, colas, red wine, and acidic juices such as orange juice.  NO MINT OR MENTHOL PRODUCTS SO NO COUGH DROPS  USE SUGARLESS CANDY INSTEAD (Jolley ranchers or Stover's or Life Savers) or even ice chips will also do - the key is to swallow to prevent all throat clearing. NO OIL BASED VITAMINS - use powdered substitutes.  Avoid fish oil when coughing.    Please schedule a follow up visit in 3 months but call sooner if needed

## 2019-09-08 ENCOUNTER — Other Ambulatory Visit: Payer: Self-pay | Admitting: Cardiology

## 2019-10-21 ENCOUNTER — Other Ambulatory Visit: Payer: Self-pay | Admitting: Cardiology

## 2020-01-31 ENCOUNTER — Encounter: Payer: Self-pay | Admitting: Cardiology

## 2020-01-31 ENCOUNTER — Ambulatory Visit: Payer: Medicare Other | Admitting: Cardiology

## 2020-01-31 VITALS — BP 118/68 | HR 86 | Ht 73.0 in | Wt 209.0 lb

## 2020-01-31 DIAGNOSIS — I4892 Unspecified atrial flutter: Secondary | ICD-10-CM

## 2020-01-31 DIAGNOSIS — R600 Localized edema: Secondary | ICD-10-CM

## 2020-01-31 NOTE — Patient Instructions (Signed)

## 2020-01-31 NOTE — Progress Notes (Signed)
Clinical Summary David Stein is a 74 y.o.male seen today for follow up of the following medical problems.  1. Aflutter - we previously stopped amio as he was doing well, initially started during admission in 2017 with pneumonia where his aflutter was difficult to control   - no recent palpitations, improved with increased diltiazem dose last visit - no bleeding on eliquis.  2. COPD - followed by Dr Melvyn Novas, on home O2.  3. LE edema - several year history - doing well on current lasix dosing. Takes 20mg  daily, additional 20mg  as needed.   Completed covid vaccine x 2, moderna. Working on getting his booster shot.    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     albuterol (PROVENTIL) (2.5 MG/3ML) 0.083% nebulizer solution Take 2.5 mg by nebulization every 6 (six) hours as needed for wheezing or shortness of breath.      ALPRAZOLAM PO Take 1 tablet by mouth as needed.     antiseptic oral rinse (BIOTENE) LIQD 15 mLs by Mouth Rinse route as needed for dry mouth.     apixaban (ELIQUIS) 5 MG TABS tablet Take 1 tablet (5 mg total) by mouth 2 (two) times daily. 60 tablet 0   azithromycin (ZITHROMAX) 250 MG tablet Take 2 on day one then 1 daily x 4 days 6 tablet 5   budesonide-formoterol (SYMBICORT) 160-4.5 MCG/ACT inhaler Inhale 2 puffs into the lungs 2 (two) times daily.     diltiazem (CARDIZEM CD) 240 MG 24 hr capsule TAKE (1) CAPSULE BY MOUTH EVERY DAY. 90 capsule 2   ferrous sulfate 325 (65 FE) MG tablet Take 325 mg by mouth daily with breakfast.      furosemide (LASIX) 20 MG tablet TAKE ONE TABLET BY MOUTH DAILY.MAY TAKE AN ADDITIONAL TABLET AS NEEDED. 90 tablet 3   guaiFENesin (MUCINEX) 600 MG 12 hr tablet Take 1 tablet (600 mg total) by mouth 2 (two) times daily.     levothyroxine (SYNTHROID, LEVOTHROID) 50 MCG tablet Take 50 mcg by mouth daily before breakfast.     Multiple Vitamin (MULTIVITAMIN WITH MINERALS) TABS tablet Take 1 tablet by mouth daily.     PROAIR HFA 108 (90 Base) MCG/ACT inhaler Inhale 1-2 puffs into the lungs every 6 (six) hours as needed for wheezing or shortness of breath.      Tiotropium Bromide Monohydrate (SPIRIVA RESPIMAT) 2.5 MCG/ACT AERS Inhale 2 puffs into the lungs daily. 4 g 0   traZODone (DESYREL) 50 MG tablet Take 50 mg by mouth at bedtime.     VITAMIN D PO Take 1 tablet by mouth daily.     No current facility-administered medications for this visit.     Past Surgical History:  Procedure Laterality Date   CATARACT EXTRACTION W/PHACO Right 01/31/2014   Procedure: CATARACT EXTRACTION PHACO AND INTRAOCULAR LENS PLACEMENT RIGHT EYE CDE=6.90;  Surgeon: Elta Guadeloupe T. Gershon Crane, MD;  Location: [facility];  Service: Ophthalmology;  Laterality: Right;   CATARACT EXTRACTION W/PHACO Left 02/14/2014  Procedure: CATARACT EXTRACTION PHACO AND INTRAOCULAR LENS PLACEMENT; CDE:  8.34;  Surgeon: Elta Guadeloupe T. Gershon Crane, MD;  Location: [facility];  Service: Ophthalmology;  Laterality: Left;   KNEE SURGERY Left      Allergies  Allergen Reactions   Codeine Other (See Comments)    headache      Family History  Problem Relation Age of Onset   Rheumatic fever Father    Alzheimer's disease Mother    Stroke Sister      Social History David Stein reports that he quit smoking about 4 years ago. His smoking use included cigarettes. He has a 13.75 pack-year smoking history. He has never used smokeless tobacco. David Stein reports no history of alcohol use.   Review of Systems CONSTITUTIONAL: No weight loss, fever, chills,  weakness or fatigue.  HEENT: Eyes: No visual loss, blurred vision, double vision or yellow sclerae.No hearing loss, sneezing, congestion, runny nose or sore throat.  SKIN: No rash or itching.  CARDIOVASCULAR: per hpi RESPIRATORY: chronic SOB GASTROINTESTINAL: No anorexia, nausea, vomiting or diarrhea. No abdominal pain or blood.  GENITOURINARY: No burning on urination, no polyuria NEUROLOGICAL: No headache, dizziness, syncope, paralysis, ataxia, numbness or tingling in the extremities. No change in bowel or bladder control.  MUSCULOSKELETAL: No muscle, back pain, joint pain or stiffness.  LYMPHATICS: No enlarged nodes. No history of splenectomy.  PSYCHIATRIC: No history of depression or anxiety.  ENDOCRINOLOGIC: No reports of sweating, cold or heat intolerance. No polyuria or polydipsia.  Marland Kitchen   Physical Examination Today's Vitals   01/31/20 1339  BP: 118/68  Pulse: 86  SpO2: 92%  Weight: 209 lb (94.8 kg)  Height: 6\' 1"  (1.854 m)   Body mass index is 27.57 kg/m.  Gen: resting comfortably, no acute distress HEENT: no scleral icterus, pupils equal round and reactive, no palptable cervical adenopathy,  CV: RRR, no mr/g, no jvd Resp: Clear to auscultation bilaterally GI: abdomen is soft, non-tender, non-distended, normal bowel sounds, no hepatosplenomegaly MSK: extremities are warm, no edema.  Skin: warm, no rash Neuro:  no focal deficits Psych: appropriate affect   Diagnostic Studies 11/2015 echo Study Conclusions  - Left ventricle: The cavity size was normal. Wall thickness was normal. Systolic function was normal. The estimated ejection fraction was in the range of 55% to 60%. Although no diagnostic regional wall motion abnormality was identified, this possibility cannot be completely excluded on the basis of this study. - Ventricular septum: Septal motion showed abnormal function, dyssynergy, and paradox. The contour showed systolic flattening. These changes  are consistent with RV pressure overload.  Impressions:  - The systolic PA pressure could not be estimated due to the absence of TR jet. The 2D findings are suspicious for severely elevated PA pressure.    Assessment and Plan   1. Aflutter -no recent symptoms, continue diltaizem and eliquis.    2. LE edema - controlled, continue current lasix dosing.    F/u 6 months  Arnoldo Lenis, M.D.

## 2020-02-02 ENCOUNTER — Encounter: Payer: Self-pay | Admitting: *Deleted

## 2020-07-12 ENCOUNTER — Encounter (HOSPITAL_COMMUNITY): Payer: Self-pay | Admitting: Emergency Medicine

## 2020-07-12 ENCOUNTER — Other Ambulatory Visit: Payer: Self-pay

## 2020-07-12 ENCOUNTER — Observation Stay (HOSPITAL_COMMUNITY)
Admission: EM | Admit: 2020-07-12 | Discharge: 2020-07-13 | Disposition: A | Discharge status: Home / Self Care | Payer: Medicare Other | Attending: Internal Medicine | Admitting: Internal Medicine

## 2020-07-12 ENCOUNTER — Telehealth: Payer: Self-pay | Admitting: Cardiology

## 2020-07-12 ENCOUNTER — Emergency Department (HOSPITAL_COMMUNITY): Payer: Medicare Other

## 2020-07-12 ENCOUNTER — Ambulatory Visit (INDEPENDENT_AMBULATORY_CARE_PROVIDER_SITE_OTHER): Payer: Medicare Other

## 2020-07-12 VITALS — BP 112/62 | HR 99 | Wt 210.0 lb

## 2020-07-12 DIAGNOSIS — J449 Chronic obstructive pulmonary disease, unspecified: Secondary | ICD-10-CM

## 2020-07-12 DIAGNOSIS — Z87891 Personal history of nicotine dependence: Secondary | ICD-10-CM | POA: Diagnosis not present

## 2020-07-12 DIAGNOSIS — E782 Mixed hyperlipidemia: Secondary | ICD-10-CM | POA: Diagnosis not present

## 2020-07-12 DIAGNOSIS — J9611 Chronic respiratory failure with hypoxia: Secondary | ICD-10-CM

## 2020-07-12 DIAGNOSIS — Z20822 Contact with and (suspected) exposure to covid-19: Secondary | ICD-10-CM | POA: Insufficient documentation

## 2020-07-12 DIAGNOSIS — D72829 Elevated white blood cell count, unspecified: Secondary | ICD-10-CM | POA: Diagnosis not present

## 2020-07-12 DIAGNOSIS — E785 Hyperlipidemia, unspecified: Secondary | ICD-10-CM

## 2020-07-12 DIAGNOSIS — Z79899 Other long term (current) drug therapy: Secondary | ICD-10-CM | POA: Diagnosis not present

## 2020-07-12 DIAGNOSIS — I4892 Unspecified atrial flutter: Secondary | ICD-10-CM

## 2020-07-12 DIAGNOSIS — E039 Hypothyroidism, unspecified: Secondary | ICD-10-CM | POA: Diagnosis not present

## 2020-07-12 DIAGNOSIS — D539 Nutritional anemia, unspecified: Secondary | ICD-10-CM

## 2020-07-12 DIAGNOSIS — R079 Chest pain, unspecified: Secondary | ICD-10-CM | POA: Diagnosis present

## 2020-07-12 HISTORY — DX: Unspecified atrial flutter: I48.92

## 2020-07-12 LAB — CBC WITH DIFFERENTIAL/PLATELET
Abs Immature Granulocytes: 0.06 10*3/uL (ref 0.00–0.07)
Basophils Absolute: 0.1 10*3/uL (ref 0.0–0.1)
Basophils Relative: 1 %
Eosinophils Absolute: 0.4 10*3/uL (ref 0.0–0.5)
Eosinophils Relative: 4 %
HCT: 43.1 % (ref 39.0–52.0)
Hemoglobin: 13 g/dL (ref 13.0–17.0)
Immature Granulocytes: 1 %
Lymphocytes Relative: 16 %
Lymphs Abs: 1.9 10*3/uL (ref 0.7–4.0)
MCH: 31 pg (ref 26.0–34.0)
MCHC: 30.2 g/dL (ref 30.0–36.0)
MCV: 102.9 fL — ABNORMAL HIGH (ref 80.0–100.0)
Monocytes Absolute: 1.1 10*3/uL — ABNORMAL HIGH (ref 0.1–1.0)
Monocytes Relative: 9 %
Neutro Abs: 8.5 10*3/uL — ABNORMAL HIGH (ref 1.7–7.7)
Neutrophils Relative %: 69 %
Platelets: 204 10*3/uL (ref 150–400)
RBC: 4.19 MIL/uL — ABNORMAL LOW (ref 4.22–5.81)
RDW: 12.7 % (ref 11.5–15.5)
WBC: 12.1 10*3/uL — ABNORMAL HIGH (ref 4.0–10.5)
nRBC: 0 % (ref 0.0–0.2)

## 2020-07-12 LAB — COMPREHENSIVE METABOLIC PANEL
ALT: 15 U/L (ref 0–44)
AST: 18 U/L (ref 15–41)
Albumin: 3.7 g/dL (ref 3.5–5.0)
Alkaline Phosphatase: 93 U/L (ref 38–126)
Anion gap: 7 (ref 5–15)
BUN: 13 mg/dL (ref 8–23)
CO2: 39 mmol/L — ABNORMAL HIGH (ref 22–32)
Calcium: 9.1 mg/dL (ref 8.9–10.3)
Chloride: 91 mmol/L — ABNORMAL LOW (ref 98–111)
Creatinine, Ser: 0.77 mg/dL (ref 0.61–1.24)
GFR, Estimated: >60 mL/min (ref >60)
Glucose, Bld: 106 mg/dL — ABNORMAL HIGH (ref 70–99)
Potassium: 4.5 mmol/L (ref 3.5–5.1)
Sodium: 137 mmol/L (ref 135–145)
Total Bilirubin: 0.5 mg/dL (ref 0.3–1.2)
Total Protein: 7.2 g/dL (ref 6.5–8.1)

## 2020-07-12 LAB — MAGNESIUM: Magnesium: 2.1 mg/dL (ref 1.7–2.4)

## 2020-07-12 LAB — TROPONIN I (HIGH SENSITIVITY)
Troponin I (High Sensitivity): 6 ng/L (ref <18)
Troponin I (High Sensitivity): 6 ng/L (ref <18)

## 2020-07-12 LAB — RESP PANEL BY RT-PCR (FLU A&B, COVID) ARPGX2
Influenza A by PCR: NEGATIVE
Influenza B by PCR: NEGATIVE
SARS Coronavirus 2 by RT PCR: NEGATIVE

## 2020-07-12 LAB — BRAIN NATRIURETIC PEPTIDE: B Natriuretic Peptide: 95 pg/mL (ref 0.0–100.0)

## 2020-07-12 MED ORDER — ENOXAPARIN SODIUM 40 MG/0.4ML ~~LOC~~ SOLN: 40.0000 mg | SUBCUTANEOUS | Status: DC

## 2020-07-12 MED ORDER — TIOTROPIUM BROMIDE MONOHYDRATE 2.5 MCG/ACT IN AERS: 2.0000 | INHALATION_SPRAY | Freq: Every day | RESPIRATORY_TRACT | Status: DC

## 2020-07-12 MED ORDER — APIXABAN 5 MG PO TABS
5.0000 mg | ORAL_TABLET | Freq: Two times a day (BID) | ORAL | Status: DC
  Administered 2020-07-12 – 2020-07-13 (×2): 5 mg via ORAL
  Filled 2020-07-12 (×2): qty 1

## 2020-07-12 MED ORDER — ALBUTEROL SULFATE HFA 108 (90 BASE) MCG/ACT IN AERS
2.0000 | INHALATION_SPRAY | Freq: Once | RESPIRATORY_TRACT | Status: AC
  Administered 2020-07-12: 2 via RESPIRATORY_TRACT
  Filled 2020-07-12: qty 6.7

## 2020-07-12 MED ORDER — FUROSEMIDE 10 MG/ML IJ SOLN
20.0000 mg | Freq: Once | INTRAMUSCULAR | Status: AC
  Administered 2020-07-12: 20 mg via INTRAVENOUS
  Filled 2020-07-12: qty 2

## 2020-07-12 MED ORDER — ALBUTEROL SULFATE (2.5 MG/3ML) 0.083% IN NEBU: 2.5000 mg | INHALATION_SOLUTION | Freq: Four times a day (QID) | RESPIRATORY_TRACT | Status: DC | PRN

## 2020-07-12 MED ORDER — TRAZODONE HCL 50 MG PO TABS
50.0000 mg | ORAL_TABLET | Freq: Every day | ORAL | Status: DC
  Administered 2020-07-12: 50 mg via ORAL
  Filled 2020-07-12: qty 1

## 2020-07-12 MED ORDER — LEVOTHYROXINE SODIUM 50 MCG PO TABS: 50.0000 ug | ORAL_TABLET | Freq: Every day | ORAL | Status: DC

## 2020-07-12 MED ORDER — UMECLIDINIUM BROMIDE 62.5 MCG/INH IN AEPB
1.0000 | INHALATION_SPRAY | Freq: Every day | RESPIRATORY_TRACT | Status: DC
  Administered 2020-07-13: 1 via RESPIRATORY_TRACT
  Filled 2020-07-12: qty 7

## 2020-07-12 MED ORDER — MOMETASONE FURO-FORMOTEROL FUM 200-5 MCG/ACT IN AERO
2.0000 | INHALATION_SPRAY | Freq: Two times a day (BID) | RESPIRATORY_TRACT | Status: DC
  Administered 2020-07-13: 2 via RESPIRATORY_TRACT
  Filled 2020-07-12: qty 8.8

## 2020-07-12 MED ORDER — LEVOTHYROXINE SODIUM 50 MCG PO TABS
50.0000 ug | ORAL_TABLET | Freq: Every day | ORAL | Status: DC
  Administered 2020-07-13: 50 ug via ORAL
  Filled 2020-07-12: qty 1

## 2020-07-12 MED ORDER — ALBUTEROL SULFATE HFA 108 (90 BASE) MCG/ACT IN AERS: 1.0000 | INHALATION_SPRAY | Freq: Four times a day (QID) | RESPIRATORY_TRACT | Status: DC | PRN

## 2020-07-12 MED ORDER — GUAIFENESIN ER 600 MG PO TB12
600.0000 mg | ORAL_TABLET | Freq: Two times a day (BID) | ORAL | Status: DC
  Administered 2020-07-12 – 2020-07-13 (×2): 600 mg via ORAL
  Filled 2020-07-12 (×2): qty 1

## 2020-07-12 MED ORDER — FUROSEMIDE 10 MG/ML IJ SOLN
20.0000 mg | Freq: Two times a day (BID) | INTRAMUSCULAR | Status: DC
  Administered 2020-07-13: 20 mg via INTRAVENOUS
  Filled 2020-07-12: qty 2

## 2020-07-12 MED ORDER — ROSUVASTATIN CALCIUM 10 MG PO TABS
10.0000 mg | ORAL_TABLET | Freq: Every day | ORAL | Status: DC
  Administered 2020-07-13: 10 mg via ORAL
  Filled 2020-07-12: qty 1

## 2020-07-12 NOTE — ED Triage Notes (Signed)
Pt sent over from heart care for a flutter, pt c/o chest discomfort.

## 2020-07-12 NOTE — H&P (Signed)
History and Physical  [name] TFT:[id number] DOB: 1945-06-14 DOA: 07/12/2020  Referring physician: Crist Infante, MD PCP: [facility]  Patient coming from: Home  Chief Complaint: Chest pain and shortness of breath  HPI: David Stein is a 75 y.o. male with medical history significant for.  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  He complains of 1 week onset of increased right leg swelling and states that he has gained about 2-3 pounds in the last 3 days.  Patient states that  3 days ago (4/18), he noted his heart rate stayed elevated to the 120s and this was associated with palpitations and fatigue.  He had a nursing visit at Dr. Nelly Laurence office, EKG done showed atrial flutter (though, subtle, it was confirmed on rhythm strip) he was noted to be fluid overloaded and was sent to the ED for diuresis and possible cardioversion in the morning. He denies fever, chills, headache, nausea, vomiting or diarrhea.  ED Course:  In the emergency department, he was hemodynamically stable.  Work-up in the ED showed leukocytosis, macrocytic anemia, troponin x2 was flat at 6, BNP 95, magnesium 2.1.  Influenza A, B and SARS coronavirus 2 was negative. Chest x-Harout showed COPD/chronic changes with no active disease. Breathing treatment was provided, IV Lasix 20 Mg x1 was given.  Hospitalist was asked to admit patient for further evaluation and management.  Review of Systems: Constitutional: Negative for chills and fever.  HENT: Negative for ear pain and sore throat.   Eyes: Negative for pain and visual disturbance.  Respiratory: Positive for shortness of breath.  Negative for cough  Cardiovascular: Positive for chest pain and leg swelling Gastrointestinal: Negative for abdominal pain and vomiting.  Endocrine:  Negative for polyphagia and polyuria.  Genitourinary: Negative for decreased urine volume, dysuria, enuresis Musculoskeletal: Negative for arthralgias and back pain.  Skin: Negative for color change and rash.  Allergic/Immunologic: Negative for immunocompromised state.  Neurological: Negative for tremors, syncope, speech difficulty, weakness, light-headedness and headaches.  Hematological: Does not bruise/bleed easily.  All other systems reviewed and are negative   Past Medical History:  Diagnosis Date  . Arthritis   . COPD (chronic obstructive pulmonary disease) ([name])   . Kidney stones   . 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 Restless leg syndrome   . Shortness of breath dyspnea    Past Surgical History:  Procedure Laterality Date  . CATARACT EXTRACTION W/PHACO Right 01/31/2014   Procedure: CATARACT EXTRACTION PHACO AND INTRAOCULAR LENS PLACEMENT RIGHT EYE CDE=6.90;  Surgeon: Elta Guadeloupe T. Gershon Crane, MD;  Location: [facility];  Service: Ophthalmology;  Laterality: Right;  . CATARACT EXTRACTION W/PHACO Left 02/14/2014   Procedure: CATARACT EXTRACTION PHACO AND INTRAOCULAR LENS PLACEMENT; CDE:  8.34;  Surgeon: Elta Guadeloupe T. Gershon Crane, MD;  Location: [facility];  Service: Ophthalmology;  Laterality: Left;  . KNEE SURGERY Left     Social History:  reports that he quit smoking about 4 years ago. His smoking use included cigarettes. He has a 13.75 pack-year smoking history. He has never used smokeless tobacco. He reports that he does not drink alcohol and does not use drugs.   Allergies  Allergen Reactions  . Codeine Other (See Comments)    headache    Family History  Problem Relation Age of Onset  . Rheumatic fever Father   . Alzheimer's disease Mother   .  Stroke Sister      Prior to Admission medications   Medication Sig Start Date End Date Taking? Authorizing Provider  acetaminophen (TYLENOL) 500 MG tablet Take 500 mg by mouth every 6 (six) hours as needed.   Yes [provider]  albuterol  (PROVENTIL) (2.5 MG/3ML) 0.083% nebulizer solution Take 2.5 mg by nebulization every 6 (six) hours as needed for wheezing or shortness of breath.   Yes [provider]  ALPRAZolam (XANAX) 0.5 MG tablet Take 0.5 mg by mouth every 8 (eight) hours as needed for anxiety. 06/11/20  Yes [provider]  antiseptic oral rinse (BIOTENE) LIQD 15 mLs by Mouth Rinse route as needed for dry mouth.   Yes [provider]  apixaban (ELIQUIS) 5 MG TABS tablet Take 1 tablet (5 mg total) by mouth 2 (two) times daily. 12/14/15  Yes Laqueta Linden, MD  budesonide-formoterol [facility]) 160-4.5 MCG/ACT inhaler Inhale 2 puffs into the lungs 2 (two) times daily.   Yes [provider]  clotrimazole-betamethasone (LOTRISONE) cream Apply 1 application topically daily. 06/11/20  Yes [provider]  diltiazem (CARDIZEM CD) 240 MG 24 hr capsule TAKE (1) CAPSULE BY MOUTH EVERY DAY. Patient taking differently: Take 240 mg by mouth daily. 10/21/19  Yes BranchAlphonse Guild, MD  ferrous sulfate 325 (65 FE) MG tablet Take 325 mg by mouth daily with breakfast.   Yes [provider]  fluocinonide cream (LIDEX) 0.05 % Apply topically 3 (three) times daily as needed. 06/11/20  Yes [provider]  furosemide (LASIX) 20 MG tablet TAKE ONE TABLET BY MOUTH DAILY.MAY TAKE AN ADDITIONAL TABLET AS NEEDED. 09/09/19  Yes Branch, Alphonse Guild, MD  guaiFENesin (MUCINEX) 600 MG 12 hr tablet Take 1 tablet (600 mg total) by mouth 2 (two) times daily. 12/14/15  Yes Laqueta Linden, MD  levothyroxine (SYNTHROID, LEVOTHROID) 50 MCG tablet Take 50 mcg by mouth daily before breakfast.   Yes [provider]  PROAIR HFA 108 (90 Base) MCG/ACT inhaler Inhale 1 puff into the lungs every 6 (six) hours as needed for wheezing or shortness of breath. 12/06/15  Yes [provider]  rosuvastatin (CRESTOR) 10 MG tablet Take 10 mg by mouth daily. 06/12/20  Yes [provider]   Tiotropium Bromide Monohydrate (SPIRIVA RESPIMAT) 2.5 MCG/ACT AERS Inhale 2 puffs into the lungs daily. 08/24/19  Yes Tanda Rockers, MD  traZODone (DESYREL) 50 MG tablet Take 50 mg by mouth at bedtime.   Yes [provider]  VITAMIN D PO Take 1 tablet by mouth daily.   Yes [provider]  ALPRAZOLAM PO Take 1 tablet by mouth as needed. Patient not taking: No sig reported    [provider]  Multiple Vitamin (MULTIVITAMIN WITH MINERALS) TABS tablet Take 1 tablet by mouth daily. Patient not taking: Reported on 07/12/2020    [provider]    Physical Exam: BP 137/84   Pulse 82   Temp 98.1 F (36.7 C) (Oral)   Resp 14   Ht 6\' 1"  (1.854 m)   Wt 95.3 kg   SpO2 95%   BMI 27.71 kg/m   . General: 75 y.o. year-old male well developed well nourished in no acute distress.  Alert and oriented x3. . Cardiovascular: Regular rate and rhythm with no rubs or gallops.  No thyromegaly or JVD noted.. 2/4 pulses in all 4 extremities. Marland Kitchen Respiratory: Decreased breath sounds and mild diffuse expiratory wheezing. . Abdomen: Soft nontender nondistended with normal bowel sounds x4  quadrants. . Muskuloskeletal: +2 lower extremity edema bilaterally.  No cyanosis or clubbing       noted bilaterally . Neuro: CN II-XII intact, strength 5/5 x 4, sensation intact . Skin: No ulcerative lesions noted or rashes . Psychiatry: Judgement and insight appear normal. Mood is appropriate for condition and setting          Labs on Admission:  Basic Metabolic Panel: Recent Labs  Lab 07/12/20 1727  NA 137  K 4.5  CL 91*  CO2 39*  GLUCOSE 106*  BUN 13  CREATININE 0.77  CALCIUM 9.1  MG 2.1   Liver Function Tests: Recent Labs  Lab 07/12/20 1727  AST 18  ALT 15  ALKPHOS 93  BILITOT 0.5  PROT 7.2  ALBUMIN 3.7   No results for input(s): LIPASE, AMYLASE in the last 168 hours. No results for input(s): AMMONIA in the last 168 hours. CBC: Recent Labs  Lab 07/12/20 1727   WBC 12.1*  NEUTROABS 8.5*  HGB 13.0  HCT 43.1  MCV 102.9*  PLT 204   Cardiac Enzymes: No results for input(s): CKTOTAL, CKMB, CKMBINDEX, TROPONINI in the last 168 hours.  BNP (last 3 results) Recent Labs    07/12/20 1729  BNP 95.0    ProBNP (last 3 results) No results for input(s): PROBNP in the last 8760 hours.  CBG: No results for input(s): GLUCAP in the last 168 hours.  Radiological Exams on Admission: DG Chest Portable 1 View  Result Date: 07/12/2020 CLINICAL DATA:  Shortness of breath, chest pain EXAM: PORTABLE CHEST 1 VIEW COMPARISON:  11/09/2017 FINDINGS: There is hyperinflation of the lungs compatible with COPD. Heart is normal size. Aortic atherosclerosis. Scarring in the lung bases. No acute confluent opacities or effusions. IMPRESSION: COPD/chronic changes.  No active disease. Electronically Signed   By: Rolm Baptise M.D.   On: 07/12/2020 17:33    EKG: I independently viewed the EKG done and my findings are as followed: Normal sinus rhythm at a rate of 88 bpm   Assessment/Plan Present on Admission: . Atrial flutter (HCC)  Principal Problem:   Atrial flutter ([place]) Active Problems:   COPD (chronic obstructive pulmonary disease) (HCC)   Chronic respiratory failure with hypoxia (HCC)   Leukocytosis   Macrocytic anemia   Hypothyroidism   Hyperlipidemia  Worsening shortness of breath and fatigue in the setting of atrial flutter r/o CHF EKG currently showing normal sinus rhythm at a rate of 88 bpm IV Lasix 20 Mg x1 was given due to fluid overload, continue IV Lasix 20 mg twice daily with holding parameters Continue telemetry BNP = 95; last echocardiogram was September 2017 with LVEF of 55 to 60% Continue total input/output, daily weights and fluid restriction Continue Cardiac diet  Continue Eliquis Echocardiogram will be checked in the morning Cardiology aware of patient's symptoms and plan for possible cardioversion in the morning  Chest pain in the  setting of above Patient's pain appears to be in the substernal area, though the pain is subsided at this time Troponin x2 was flat at 6 Continue telemetry encouraged to monitor patient Patient will be seen by cardiologist in the morning.  Chronic respiratory failure with hypoxia due to COPD Continue Mucinex, Dulera, albuterol, tiotropium Continue supplemental oxygen per home regimen  Leukocytosis possibly reactive  WBC 12.1; no obvious sign of any acute infectious process Continue to monitor WBC with morning labs  Macrocytic anemia MCV 102.9; folate and vitamin B12 levels will be checked  Hypothyroidism Continue Synthroid  Hyperlipidemia  Continue Crestor   DVT prophylaxis: Eliquis  Code Status: Full code  Family Communication: Wife at bedside (all questions answered to satisfaction)  Disposition Plan:  Patient is from:                        home Anticipated DC to:                   SNF or family members home Anticipated DC date:               2-3 days Anticipated DC barriers:           Patient requires inpatient management due to shortness of breath and fatigue pending further work-up and cardiology consult.   Consults called: Cardiology  Admission status: Inpatient  Bernadette Hoit MD [facility]  07/12/2020, 10:36 PM

## 2020-07-12 NOTE — Patient Instructions (Signed)
Patient seen by Dr.Branch. Will be a direct admit to ED for volume overload and atrial flutter. For diuresis and probable cardioversion tomorrow. Report given to ED Charge nurse.

## 2020-07-12 NOTE — Telephone Encounter (Signed)
I spoke with patient. He states that the last few evenings, he notices elevated heart rate. I clarified that he was feeling the elevated heart rate and not simply reading his BP machine. The tachycardic episodes were lasting only 15-20 minutes. Now however the are lasting for several hours. He reports heart rates up to 125 pbm. This am he states his HR was 125 and thirty minutes after taking diltiazem 240 mg, HR was 84 and BP was 103/58.  He denies caffeine or alcohol use. He will continue to monitor.      I will forward to Dr.Branch for advice.

## 2020-07-12 NOTE — Telephone Encounter (Signed)
I spoke with patient. He states he feels so tired he is not sure if he can make it to the office or not. He may ask family to bring him.I scheduled appt today and told him to come whenever he can get here.

## 2020-07-12 NOTE — Progress Notes (Addendum)
Patient presented for nursing visit for elevated heart rates at home   Reports over the last 3 days significant worsening fatigue, SOB, increased LE edema. Checked his vitals at home and noticed at times hear rates were elevated to 120s. Has had some palpitations as well. Denies any fevers, chills, or cough. COPD on home O2. On nursing visit EKG shows aflutter (sublte aflutter and in some leads looks like SR but from extended rhythm strip review consistent with aflutter) and he appears volume overloaded. Have referred patient to ER. Will need diuresis and likely cardioversion tomorrow. Unclear if aflutter and volume overload only issue as he appears very fatigued and drained, would f/u basic labs for any other findings.  Please admit overnight to medicine for diuresis, make npo tonight for possible cardioversion tomorrow. Has not missed any eliquis doses per his report. With increased SOB would look to diurese him first prior to DCCV thus would not pursue in ER. Likely DCCV tomorrow pending clinical assessment. Continue home meds for aflutter    Carlyle Dolly MD

## 2020-07-12 NOTE — ED Provider Notes (Signed)
[facility] EMERGENCY DEPARTMENT Provider Note   CSN: [id number] Arrival date & time: 07/12/20  1627     History Chief Complaint  Patient presents with  . Chest Pain    David Stein is a 75 y.o. male.  HPI 76 year old male presents from the cardiology office for dyspnea and atrial flutter.  The patient has been feeling chest pressure on and off for about 3 days.  He has also had some increasing dyspnea from baseline (wears about 3 L oxygen at baseline).  No cough or fever.  His legs have been more swollen and he has gained about 3 pounds in the last 3 days.  He has felt palpitations on and off and had on and off chest pressure.  Feels diffusely fatigued.  Has been compliant with his meds including his anticoagulation.   Past Medical History:  Diagnosis Date  . Arthritis   . COPD (chronic obstructive pulmonary disease) ([place])   . Kidney stones   . 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 Restless leg syndrome   . Shortness of breath dyspnea     Patient Active Problem List   Diagnosis Date Noted  . Atrial flutter ([place]) 07/12/2020  . Leukocytosis 07/12/2020  . Macrocytic anemia 07/12/2020  . Hypothyroidism 07/12/2020  . Hyperlipidemia 07/12/2020  . Atrial flutter with rapid ventricular response ([place]) 12/08/2015  . CAP (community acquired pneumonia) 12/08/2015  . Chronic respiratory failure with hypoxia ([place]) 12/08/2015  . COPD (chronic obstructive pulmonary disease) ([place])   . Arthritis   . Restless leg syndrome   . Kidney stones     Past Surgical History:  Procedure Laterality Date  . CATARACT EXTRACTION W/PHACO Right 01/31/2014   Procedure: CATARACT EXTRACTION PHACO AND INTRAOCULAR LENS PLACEMENT RIGHT EYE CDE=6.90;  Surgeon: Elta Guadeloupe T. Gershon Crane, MD;  Location: [facility];  Service: Ophthalmology;  Laterality: Right;  . CATARACT EXTRACTION W/PHACO Left 02/14/2014   Procedure: CATARACT EXTRACTION PHACO AND INTRAOCULAR LENS PLACEMENT; CDE:  8.34;  Surgeon: Elta Guadeloupe T. Gershon Crane, MD;  Location:  [facility];  Service: Ophthalmology;  Laterality: Left;  . KNEE SURGERY Left        Family History  Problem Relation Age of Onset  . Rheumatic fever Father   . Alzheimer's disease Mother   . Stroke Sister     Social History   Tobacco Use  . Smoking status: Former Smoker    Packs/day: 0.25    Years: 55.00    Pack years: 13.75    Types: Cigarettes    Quit date: 12/12/2015    Years since quitting: 4.5  . Smokeless tobacco: Never Used  Substance Use Topics  . Alcohol use: No  . Drug use: No    Home Medications Prior to Admission medications   Medication Sig Start Date End Date Taking? Authorizing Provider  acetaminophen (TYLENOL) 500 MG tablet Take 500 mg by mouth every 6 (six) hours as needed.   Yes [provider]  albuterol (PROVENTIL) (2.5 MG/3ML) 0.083% nebulizer solution Take 2.5 mg by nebulization every 6 (six) hours as needed for wheezing or shortness of breath.   Yes [provider]  ALPRAZolam (XANAX) 0.5 MG tablet Take 0.5 mg by mouth every 8 (eight) hours as needed for anxiety. 06/11/20  Yes [provider]  antiseptic oral rinse (BIOTENE) LIQD 15 mLs by Mouth Rinse route as needed for dry mouth.   Yes [provider]  apixaban (ELIQUIS) 5 MG TABS tablet Take 1 tablet (5 mg total) by mouth  2 (two) times daily. 12/14/15  Yes Laqueta Linden, MD  budesonide-formoterol [facility]) 160-4.5 MCG/ACT inhaler Inhale 2 puffs into the lungs 2 (two) times daily.   Yes [provider]  clotrimazole-betamethasone (LOTRISONE) cream Apply 1 application topically daily. 06/11/20  Yes [provider]  diltiazem (CARDIZEM CD) 240 MG 24 hr capsule TAKE (1) CAPSULE BY MOUTH EVERY DAY. Patient taking differently: Take 240 mg by mouth daily. 10/21/19  Yes BranchAlphonse Guild, MD  ferrous sulfate 325 (65 FE) MG tablet Take 325 mg by mouth daily with breakfast.   Yes [provider]  fluocinonide cream (LIDEX) 0.05 % Apply topically  3 (three) times daily as needed. 06/11/20  Yes [provider]  furosemide (LASIX) 20 MG tablet TAKE ONE TABLET BY MOUTH DAILY.MAY TAKE AN ADDITIONAL TABLET AS NEEDED. 09/09/19  Yes Branch, Alphonse Guild, MD  guaiFENesin (MUCINEX) 600 MG 12 hr tablet Take 1 tablet (600 mg total) by mouth 2 (two) times daily. 12/14/15  Yes Laqueta Linden, MD  levothyroxine (SYNTHROID, LEVOTHROID) 50 MCG tablet Take 50 mcg by mouth daily before breakfast.   Yes [provider]  PROAIR HFA 108 (90 Base) MCG/ACT inhaler Inhale 1 puff into the lungs every 6 (six) hours as needed for wheezing or shortness of breath. 12/06/15  Yes [provider]  rosuvastatin (CRESTOR) 10 MG tablet Take 10 mg by mouth daily. 06/12/20  Yes [provider]  Tiotropium Bromide Monohydrate (SPIRIVA RESPIMAT) 2.5 MCG/ACT AERS Inhale 2 puffs into the lungs daily. 08/24/19  Yes Tanda Rockers, MD  traZODone (DESYREL) 50 MG tablet Take 50 mg by mouth at bedtime.   Yes [provider]  VITAMIN D PO Take 1 tablet by mouth daily.   Yes [provider]  ALPRAZOLAM PO Take 1 tablet by mouth as needed. Patient not taking: No sig reported    [provider]  Multiple Vitamin (MULTIVITAMIN WITH MINERALS) TABS tablet Take 1 tablet by mouth daily. Patient not taking: Reported on 07/12/2020    [provider]    Allergies    Codeine  Review of Systems   Review of Systems  Constitutional: Negative for fever.  Respiratory: Positive for shortness of breath. Negative for cough.   Cardiovascular: Positive for chest pain and leg swelling.  Gastrointestinal: Negative for abdominal pain, diarrhea and vomiting.  All other systems reviewed and are negative.   Physical Exam Updated Vital Signs BP 130/79   Pulse 83   Temp 98.1 F (36.7 C) (Oral)   Resp (!) 21   Ht 6\' 1"  (1.854 m)   Wt 95.3 kg   SpO2 93%   BMI 27.71 kg/m   Physical Exam Vitals and nursing note reviewed.   Constitutional:      General: He is not in acute distress.    Appearance: He is well-developed. He is not ill-appearing or diaphoretic.  HENT:     Head: Normocephalic and atraumatic.     Right Ear: External ear normal.     Left Ear: External ear normal.     Nose: Nose normal.  Eyes:     General:        Right eye: No discharge.        Left eye: No discharge.  Cardiovascular:     Rate and Rhythm: Regular rhythm. Tachycardia present.     Heart sounds: Normal heart sounds.  Pulmonary:     Effort: Pulmonary effort is normal.     Breath sounds: Decreased breath  sounds and wheezing (mild) present.  Abdominal:     Palpations: Abdomen is soft.     Tenderness: There is no abdominal tenderness.  Musculoskeletal:     Cervical back: Neck supple.     Right lower leg: Edema present.     Left lower leg: Edema present.  Skin:    General: Skin is warm and dry.  Neurological:     Mental Status: He is alert.  Psychiatric:        Mood and Affect: Mood is not anxious.     ED Results / Procedures / Treatments   Labs (all labs ordered are listed, but only abnormal results are displayed) Labs Reviewed  COMPREHENSIVE METABOLIC PANEL - Abnormal; Notable for the following components:      Result Value   Chloride 91 (*)    CO2 39 (*)    Glucose, Bld 106 (*)    All other components within normal limits  CBC WITH DIFFERENTIAL/PLATELET - Abnormal; Notable for the following components:   WBC 12.1 (*)    RBC 4.19 (*)    MCV 102.9 (*)    Neutro Abs 8.5 (*)    Monocytes Absolute 1.1 (*)    All other components within normal limits  RESP PANEL BY RT-PCR (FLU A&B, COVID) ARPGX2  BRAIN NATRIURETIC PEPTIDE  MAGNESIUM  COMPREHENSIVE METABOLIC PANEL  CBC  PROTIME-INR  APTT  MAGNESIUM  PHOSPHORUS  FOLATE  VITAMIN B12  TROPONIN I (HIGH SENSITIVITY)  TROPONIN I (HIGH SENSITIVITY)    EKG EKG Interpretation  Date/Time:  Thursday July 12 2020 16:47:22 EDT Ventricular Rate:  88 PR  Interval:  203 QRS Duration: 156 QT Interval:  382 QTC Calculation: 463 R Axis:   98 Text Interpretation: Sinus rhythm RBBB and LPFB Confirmed by Sherwood Gambler [phone]) on 07/12/2020 4:57:30 PM   Radiology DG Chest Portable 1 View  Result Date: 07/12/2020 CLINICAL DATA:  Shortness of breath, chest pain EXAM: PORTABLE CHEST 1 VIEW COMPARISON:  11/09/2017 FINDINGS: There is hyperinflation of the lungs compatible with COPD. Heart is normal size. Aortic atherosclerosis. Scarring in the lung bases. No acute confluent opacities or effusions. IMPRESSION: COPD/chronic changes.  No active disease. Electronically Signed   By: Rolm Baptise M.D.   On: 07/12/2020 17:33    Procedures Procedures   Medications Ordered in ED Medications  furosemide (LASIX) injection 20 mg (has no administration in time range)  rosuvastatin (CRESTOR) tablet 10 mg (has no administration in time range)  traZODone (DESYREL) tablet 50 mg (has no administration in time range)  levothyroxine (SYNTHROID) tablet 50 mcg (has no administration in time range)  apixaban (ELIQUIS) tablet 5 mg (has no administration in time range)  albuterol (PROVENTIL) (2.5 MG/3ML) 0.083% nebulizer solution 2.5 mg (has no administration in time range)  mometasone-formoterol (DULERA) 200-5 MCG/ACT inhaler 2 puff (has no administration in time range)  guaiFENesin (MUCINEX) 12 hr tablet 600 mg (has no administration in time range)  albuterol (VENTOLIN HFA) 108 (90 Base) MCG/ACT inhaler 1 puff (has no administration in time range)  Tiotropium Bromide Monohydrate AERS 2 puff (has no administration in time range)  albuterol (VENTOLIN HFA) 108 (90 Base) MCG/ACT inhaler 2 puff (2 puffs Inhalation Given 07/12/20 1650)  furosemide (LASIX) injection 20 mg (20 mg Intravenous Given 07/12/20 1917)  albuterol (VENTOLIN HFA) 108 (90 Base) MCG/ACT inhaler 2 puff (2 puffs Inhalation Given 07/12/20 2032)    ED Course  I have reviewed the triage vital signs and the  nursing notes.  Pertinent  labs & imaging results that were available during my care of the patient were reviewed by me and considered in my medical decision making (see chart for details).    MDM Rules/Calculators/A&P                          Patient seems to be in and out of a low-grade atrial flutter with mild tachycardia.  Given cardiology concern for fluid overload, will give some diuresis.  He does feel little better with albuterol as well.  Hopefully with diuresis, albuterol, he will be more stable for a cardioversion in the morning with cardiology.  Discussed with hospitalist for admission Final Clinical Impression(s) / ED Diagnoses Final diagnoses:  Atrial flutter, paroxysmal [facility])    Rx / DC Orders ED Discharge Orders    None       Sherwood Gambler, MD 07/12/20 2325

## 2020-07-12 NOTE — Telephone Encounter (Signed)
May be back in [place]. Can he come for a nursing visit for ekg and vitals today or tomorrow  Reinaldo Berber MD

## 2020-07-12 NOTE — Telephone Encounter (Signed)
Patient c/o Palpitations:  High priority if patient c/o lightheadedness, shortness of breath, or chest pain  1) How long have you had palpitations/irregular HR/ Afib? Are you having the symptoms now? NO States that after he took his Cardizem 240 mg at 830am started feeling better.   2) Are you currently experiencing lightheadedness, SOB or CP? NO Not at them moment. States that for the past 3 afternoons this has been happening.   3) Do you have a history of afib (atrial fibrillation) or irregular heart rhythm?  Yes   4) Have you checked your BP or HR? (document readings if available):  4/20 BP 117/72 HR 92 4/21 HR 125 took medication came down to 84  5) Are you experiencing any other symptoms? No

## 2020-07-13 ENCOUNTER — Inpatient Hospital Stay (HOSPITAL_BASED_OUTPATIENT_CLINIC_OR_DEPARTMENT_OTHER): Payer: Medicare Other

## 2020-07-13 ENCOUNTER — Encounter (HOSPITAL_COMMUNITY): Payer: Self-pay | Admitting: Internal Medicine

## 2020-07-13 DIAGNOSIS — J449 Chronic obstructive pulmonary disease, unspecified: Secondary | ICD-10-CM | POA: Diagnosis not present

## 2020-07-13 DIAGNOSIS — R600 Localized edema: Secondary | ICD-10-CM | POA: Diagnosis not present

## 2020-07-13 DIAGNOSIS — I4892 Unspecified atrial flutter: Secondary | ICD-10-CM | POA: Diagnosis not present

## 2020-07-13 DIAGNOSIS — Z20822 Contact with and (suspected) exposure to covid-19: Secondary | ICD-10-CM | POA: Diagnosis not present

## 2020-07-13 DIAGNOSIS — R079 Chest pain, unspecified: Secondary | ICD-10-CM

## 2020-07-13 DIAGNOSIS — J9611 Chronic respiratory failure with hypoxia: Secondary | ICD-10-CM | POA: Diagnosis not present

## 2020-07-13 LAB — CBC
HCT: 41.2 % (ref 39.0–52.0)
Hemoglobin: 12.5 g/dL — ABNORMAL LOW (ref 13.0–17.0)
MCH: 30.7 pg (ref 26.0–34.0)
MCHC: 30.3 g/dL (ref 30.0–36.0)
MCV: 101.2 fL — ABNORMAL HIGH (ref 80.0–100.0)
Platelets: 200 10*3/uL (ref 150–400)
RBC: 4.07 MIL/uL — ABNORMAL LOW (ref 4.22–5.81)
RDW: 12.7 % (ref 11.5–15.5)
WBC: 9.7 10*3/uL (ref 4.0–10.5)
nRBC: 0 % (ref 0.0–0.2)

## 2020-07-13 LAB — COMPREHENSIVE METABOLIC PANEL
ALT: 15 U/L (ref 0–44)
AST: 17 U/L (ref 15–41)
Albumin: 3.5 g/dL (ref 3.5–5.0)
Alkaline Phosphatase: 86 U/L (ref 38–126)
Anion gap: 9 (ref 5–15)
BUN: 13 mg/dL (ref 8–23)
CO2: 36 mmol/L — ABNORMAL HIGH (ref 22–32)
Calcium: 8.9 mg/dL (ref 8.9–10.3)
Chloride: 91 mmol/L — ABNORMAL LOW (ref 98–111)
Creatinine, Ser: 0.7 mg/dL (ref 0.61–1.24)
GFR, Estimated: >60 mL/min (ref >60)
Glucose, Bld: 93 mg/dL (ref 70–99)
Potassium: 3.8 mmol/L (ref 3.5–5.1)
Sodium: 136 mmol/L (ref 135–145)
Total Bilirubin: 0.7 mg/dL (ref 0.3–1.2)
Total Protein: 6.9 g/dL (ref 6.5–8.1)

## 2020-07-13 LAB — ECHOCARDIOGRAM COMPLETE
Area-P 1/2: 2.82 cm2
Height: 73 in
S' Lateral: 2.5 cm
Weight: 3360 oz

## 2020-07-13 LAB — VITAMIN B12: Vitamin B-12: 286 pg/mL (ref 180–914)

## 2020-07-13 LAB — MAGNESIUM: Magnesium: 2 mg/dL (ref 1.7–2.4)

## 2020-07-13 LAB — TSH: TSH: 1.329 u[IU]/mL (ref 0.350–4.500)

## 2020-07-13 LAB — FOLATE: Folate: 9.8 ng/mL (ref >5.9)

## 2020-07-13 LAB — APTT: aPTT: 32 seconds (ref 24–36)

## 2020-07-13 LAB — PHOSPHORUS: Phosphorus: 4.3 mg/dL (ref 2.5–4.6)

## 2020-07-13 LAB — PROTIME-INR
INR: 1.2 (ref 0.8–1.2)
Prothrombin Time: 15.4 seconds — ABNORMAL HIGH (ref 11.4–15.2)

## 2020-07-13 MED ORDER — PERFLUTREN LIPID MICROSPHERE
1.0000 mL | INTRAVENOUS | Status: AC | PRN
  Administered 2020-07-13: 1 mL via INTRAVENOUS
  Filled 2020-07-13: qty 10

## 2020-07-13 MED ORDER — KETOROLAC TROMETHAMINE 15 MG/ML IJ SOLN
15.0000 mg | Freq: Once | INTRAMUSCULAR | Status: AC
  Administered 2020-07-13: 15 mg via INTRAVENOUS
  Filled 2020-07-13: qty 1

## 2020-07-13 MED ORDER — DILTIAZEM HCL ER COATED BEADS 240 MG PO CP24
240.0000 mg | ORAL_CAPSULE | Freq: Every day | ORAL | Status: DC
  Administered 2020-07-13: 240 mg via ORAL
  Filled 2020-07-13: qty 1

## 2020-07-13 MED ORDER — AMIODARONE HCL 200 MG PO TABS
400.0000 mg | ORAL_TABLET | Freq: Two times a day (BID) | ORAL | Status: DC
  Administered 2020-07-13: 400 mg via ORAL
  Filled 2020-07-13: qty 2

## 2020-07-13 MED ORDER — AMIODARONE HCL 200 MG PO TABS: ORAL_TABLET | ORAL | 1 refills | Status: DC

## 2020-07-13 NOTE — Progress Notes (Signed)
Nsg Discharge Note  Admit Date:  07/12/2020 Discharge date: 07/13/2020   David Stein to be D/C'd Home per MD order.  AVS completed.  Copy for chart, and copy for patient signed, and dated. Patient/caregiver able to verbalize understanding.  Discharge Medication: Allergies as of 07/13/2020      Reactions   Codeine Other (See Comments)   headache      Medication List    TAKE these medications   acetaminophen 500 MG tablet Commonly known as: TYLENOL Take 500 mg by mouth every 6 (six) hours as needed.   albuterol (2.5 MG/3ML) 0.083% nebulizer solution Commonly known as: PROVENTIL Take 2.5 mg by nebulization every 6 (six) hours as needed for wheezing or shortness of breath.   ProAir HFA 108 (90 Base) MCG/ACT inhaler Generic drug: albuterol Inhale 1 puff into the lungs every 6 (six) hours as needed for wheezing or shortness of breath.   ALPRAZOLAM PO Take 1 tablet by mouth as needed.   ALPRAZolam 0.5 MG tablet Commonly known as: XANAX Take 0.5 mg by mouth every 8 (eight) hours as needed for anxiety.   amiodarone 200 MG tablet Commonly known as: PACERONE Take two tabs twice a day for 7 days and then one tab daily thereafter.   antiseptic oral rinse Liqd 15 mLs by Mouth Rinse route as needed for dry mouth.   apixaban 5 MG Tabs tablet Commonly known as: ELIQUIS Take 1 tablet (5 mg total) by mouth 2 (two) times daily.   budesonide-formoterol 160-4.5 MCG/ACT inhaler Commonly known as: SYMBICORT Inhale 2 puffs into the lungs 2 (two) times daily.   clotrimazole-betamethasone cream Commonly known as: LOTRISONE Apply 1 application topically daily.   diltiazem 240 MG 24 hr capsule Commonly known as: CARDIZEM CD TAKE (1) CAPSULE BY MOUTH EVERY DAY. What changed: See the new instructions.   ferrous sulfate 325 (65 FE) MG tablet Take 325 mg by mouth daily with breakfast.   fluocinonide cream 0.05 % Commonly known as: LIDEX Apply topically 3 (three) times daily as  needed.   furosemide 20 MG tablet Commonly known as: LASIX TAKE ONE TABLET BY MOUTH DAILY.MAY TAKE AN ADDITIONAL TABLET AS NEEDED.   guaiFENesin 600 MG 12 hr tablet Commonly known as: MUCINEX Take 1 tablet (600 mg total) by mouth 2 (two) times daily.   levothyroxine 50 MCG tablet Commonly known as: SYNTHROID Take 50 mcg by mouth daily before breakfast.   multivitamin with minerals Tabs tablet Take 1 tablet by mouth daily.   rosuvastatin 10 MG tablet Commonly known as: CRESTOR Take 10 mg by mouth daily.   Spiriva Respimat 2.5 MCG/ACT Aers Generic drug: Tiotropium Bromide Monohydrate Inhale 2 puffs into the lungs daily.   traZODone 50 MG tablet Commonly known as: DESYREL Take 50 mg by mouth at bedtime.   VITAMIN D PO Take 1 tablet by mouth daily.       Discharge Assessment: Vitals:   07/13/20 0717 07/13/20 1426  BP:  111/72  Pulse:  74  Resp:  16  Temp:  99.2 F (37.3 C)  SpO2: 95% 96%   Skin clean, dry and intact without evidence of skin break down, no evidence of skin tears noted. IV catheter discontinued intact. Site without signs and symptoms of complications - no redness or edema noted at insertion site, patient denies c/o pain - only slight tenderness at site.  Dressing with slight pressure applied.  D/c Instructions-Education: Discharge instructions given to patient/family with verbalized understanding. D/c education completed with patient/family  including follow up instructions, medication list, d/c activities limitations if indicated, with other d/c instructions as indicated by MD - patient able to verbalize understanding, all questions fully answered. Patient instructed to return to ED, call 911, or call MD for any changes in condition.  Patient escorted via [place], and D/C home via private auto.  David Mcmurray, LPN 10/06/9676 9:38 PM

## 2020-07-13 NOTE — Progress Notes (Signed)
Pt's current HR=89 per tele monitor, MEWS back to green.

## 2020-07-13 NOTE — Progress Notes (Signed)
Pt has yellow MEWS r/t HR sustaining ST in the 120s, MD aware.

## 2020-07-13 NOTE — Progress Notes (Signed)
*  PRELIMINARY RESULTS* Echocardiogram 2D Echocardiogram has been performed.  Leavy Cella 07/13/2020, 11:57 AM

## 2020-07-13 NOTE — Consult Note (Addendum)
Cardiology Consultation:   Patient ID: [name] MRN: [id number]; DOB: April 19, 1945  Admit date: 07/12/2020 Date of Consult: 07/13/2020  PCP:  [facility] [place] [facility]  Cardiologist:  Carlyle Dolly, MD   Patient Profile:   David Stein is a 75 y.o. male with past medical history of paroxysmal atrial flutter (occurred in 2017 and started on Amiodarone but stopped in 02/2018 due to no recent occurrence), HLD, Hypothyroidism and COPD (on 3L [place] at baseline) who is being seen today for the evaluation of atrial flutter with RVR at the request of Dr. Josephine Cables.  History of Present Illness:   David Stein was last examined by Dr. Harl Bowie in 01/2020 and denied any progressive palpitations and was overall doing well from a cardiac perspective. Was continued on Eliquis 5mg  BID for anticoagulation and Cardizem CD 240mg  daily.   He called the office on 07/12/2020 reporting tachycardia and HR was in the 120's at times when checked at home. A nurse visit was arranged and he was found to be in atrial flutter and sent to the ED for further evaluation. He reports having intermittent palpitations over the past week and checks his oxygen level and HR on his pulse oximeter frequently and noted the elevated rates. Reports associated palpitations and dizziness when this occurs along with discomfort along his chest. He has baseline dyspnea on exertion in the setting of COPD and is on 2L [place] at baseline but denies any specific orthopnea or PND. He has noticed lower extremity edema and has been taking between 20mg  and 40mg  of Lasix on a daily basis.   Initial labs show WBC 12.1, Hgb 13.0, platelets 204, Na+ 137, K+ 4.5 and creatinine 0.77. Initial and delta HS Troponin negative at 6. BNP 95. Mg 2.1. COVID negative. CXR showing COPD with no acute changes. EKG showing 2:1 atrial flutter with known RBBB no acute ST abnormalities.   He received IV Lasix 20mg  while in the ED and  was continued on Eliquis for anticoagulation. By review of telemetry, he converted to NSR overnight and was in sinus rhythm with occasional PAC's and PVC's. He did have recurrent atrial flutter this morning around 0600 with HR in the 120's but spontaneously converted back to NSR. Had recurrent flutter around 0700 with rates in the 120's again but spontaneously converted and is in NSR at this time with HR in the 90's.    Past Medical History:  Diagnosis Date  . Arthritis   . Atrial flutter ([place])   . COPD (chronic obstructive pulmonary disease) ([place])   . Kidney stones   . 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 Restless leg syndrome   . Shortness of breath dyspnea     Past Surgical History:  Procedure Laterality Date  . CATARACT EXTRACTION W/PHACO Right 01/31/2014   Procedure: CATARACT EXTRACTION PHACO AND INTRAOCULAR LENS PLACEMENT RIGHT EYE CDE=6.90;  Surgeon: Elta Guadeloupe T. Gershon Crane, MD;  Location: [facility];  Service: Ophthalmology;  Laterality: Right;  . CATARACT EXTRACTION W/PHACO Left 02/14/2014   Procedure: CATARACT EXTRACTION PHACO AND INTRAOCULAR LENS PLACEMENT; CDE:  8.34;  Surgeon: Elta Guadeloupe T. Gershon Crane, MD;  Location: [facility];  Service: Ophthalmology;  Laterality: Left;  . KNEE SURGERY Left      Home Medications:  Prior to Admission medications   Medication Sig Start Date End Date Taking? Authorizing Provider  acetaminophen (TYLENOL) 500 MG tablet Take 500 mg by mouth every 6 (six) hours as needed.   Yes  [provider]  albuterol (PROVENTIL) (2.5 MG/3ML) 0.083% nebulizer solution Take 2.5 mg by nebulization every 6 (six) hours as needed for wheezing or shortness of breath.   Yes [provider]  ALPRAZolam (XANAX) 0.5 MG tablet Take 0.5 mg by mouth every 8 (eight) hours as needed for anxiety. 06/11/20  Yes [provider]  antiseptic oral rinse (BIOTENE) LIQD 15 mLs by Mouth Rinse route as needed for dry mouth.   Yes [provider]  apixaban (ELIQUIS) 5 MG TABS tablet  Take 1 tablet (5 mg total) by mouth 2 (two) times daily. 12/14/15  Yes Laqueta Linden, MD  budesonide-formoterol [facility]) 160-4.5 MCG/ACT inhaler Inhale 2 puffs into the lungs 2 (two) times daily.   Yes [provider]  clotrimazole-betamethasone (LOTRISONE) cream Apply 1 application topically daily. 06/11/20  Yes [provider]  diltiazem (CARDIZEM CD) 240 MG 24 hr capsule TAKE (1) CAPSULE BY MOUTH EVERY DAY. Patient taking differently: Take 240 mg by mouth daily. 10/21/19  Yes BranchAlphonse Guild, MD  ferrous sulfate 325 (65 FE) MG tablet Take 325 mg by mouth daily with breakfast.   Yes [provider]  fluocinonide cream (LIDEX) 0.05 % Apply topically 3 (three) times daily as needed. 06/11/20  Yes [provider]  furosemide (LASIX) 20 MG tablet TAKE ONE TABLET BY MOUTH DAILY.MAY TAKE AN ADDITIONAL TABLET AS NEEDED. 09/09/19  Yes Branch, Alphonse Guild, MD  guaiFENesin (MUCINEX) 600 MG 12 hr tablet Take 1 tablet (600 mg total) by mouth 2 (two) times daily. 12/14/15  Yes Laqueta Linden, MD  levothyroxine (SYNTHROID, LEVOTHROID) 50 MCG tablet Take 50 mcg by mouth daily before breakfast.   Yes [provider]  PROAIR HFA 108 (90 Base) MCG/ACT inhaler Inhale 1 puff into the lungs every 6 (six) hours as needed for wheezing or shortness of breath. 12/06/15  Yes [provider]  rosuvastatin (CRESTOR) 10 MG tablet Take 10 mg by mouth daily. 06/12/20  Yes [provider]  Tiotropium Bromide Monohydrate (SPIRIVA RESPIMAT) 2.5 MCG/ACT AERS Inhale 2 puffs into the lungs daily. 08/24/19  Yes Tanda Rockers, MD  traZODone (DESYREL) 50 MG tablet Take 50 mg by mouth at bedtime.   Yes [provider]  VITAMIN D PO Take 1 tablet by mouth daily.   Yes [provider]  ALPRAZOLAM PO Take 1 tablet by mouth as needed. Patient not taking: No sig reported    [provider]  Multiple Vitamin (MULTIVITAMIN WITH MINERALS) TABS  tablet Take 1 tablet by mouth daily. Patient not taking: Reported on 07/12/2020    [provider]    Inpatient Medications: Scheduled Meds: . apixaban  5 mg Oral BID  . diltiazem  240 mg Oral Daily  . furosemide  20 mg Intravenous Q12H  . guaiFENesin  600 mg Oral BID  . levothyroxine  50 mcg Oral Q0600  . mometasone-formoterol  2 puff Inhalation BID  . rosuvastatin  10 mg Oral Daily  . traZODone  50 mg Oral QHS  . umeclidinium bromide  1 puff Inhalation Daily   Continuous Infusions:  PRN Meds: albuterol, albuterol  Allergies:    Allergies  Allergen Reactions  . Codeine Other (See Comments)    headache    Social History:   Social History   Socioeconomic History  . Marital status: Married    Spouse name: Not on file  . Number of children: Not on file  . Years of education: Not on file  .  Highest education level: Not on file  Occupational History  . Not on file  Tobacco Use  . Smoking status: Former Smoker    Packs/day: 0.25    Years: 55.00    Pack years: 13.75    Types: Cigarettes    Quit date: 12/12/2015    Years since quitting: 4.5  . Smokeless tobacco: Never Used  Substance and Sexual Activity  . Alcohol use: No  . Drug use: No  . Sexual activity: Not Currently  Other Topics Concern  . Not on file  Social History Narrative  . Not on file   Social Determinants of Health   Financial Resource Strain: Not on file  Food Insecurity: Not on file  Transportation Needs: Not on file  Physical Activity: Not on file  Stress: Not on file  Social Connections: Not on file  Intimate Partner Violence: Not on file    Family History:    Family History  Problem Relation Age of Onset  . Rheumatic fever Father   . Alzheimer's disease Mother   . Stroke Sister      ROS:  Please see the history of present illness.   All other ROS reviewed and negative.     Physical Exam/Data:   Vitals:   07/12/20 2300 07/12/20 2340 07/13/20 0533 07/13/20 0717  BP:  130/79 107/80 130/86   Pulse: 83 99 (!) 120   Resp: (!) 21 20 20    Temp:  97.8 F (36.6 C) 98 F (36.7 C)   TempSrc:      SpO2: 93% 95% 95% 95%  Weight:      Height:        Intake/Output Summary (Last 24 hours) at 07/13/2020 0845 Last data filed at 07/12/2020 2323 Gross per 24 hour  Intake --  Output 1000 ml  Net -1000 ml   Last 3 Weights 07/12/2020 07/12/2020 01/31/2020  Weight (lbs) 210 lb 210 lb 209 lb  Weight (kg) 95.255 kg 95.255 kg 94.802 kg     Body mass index is 27.71 kg/m.  General:  Pleasant elderly male appearing in no acute distress.  HEENT: normal Lymph: no adenopathy Neck: no JVD Endocrine:  No thryomegaly Vascular: No carotid bruits; FA pulses 2+ bilaterally without bruits  Cardiac:  normal S1, S2; RRR; no murmur.  Lungs:  clear to auscultation bilaterally, no wheezing, rhonchi or rales  Abd: soft, nontender, no hepatomegaly  Ext: Trace lower extremity edema bilaterally. Musculoskeletal:  No deformities, BUE and BLE strength normal and equal Skin: warm and dry  Neuro:  CNs 2-12 intact, no focal abnormalities noted Psych:  Normal affect   EKG:  The EKG was personally reviewed and demonstrates: 2:1 atrial flutter with known RBBB no acute ST abnormalities.   Telemetry:  Telemetry was personally reviewed and demonstrates: Converted to NSR overnight and was in sinus rhythm with occasional PAC's and PVC's. He did have recurrent atrial flutter this morning around 0600 with HR in the 120's but spontaneously converted back to NSR. Had recurrent flutter around 0700 with rates in the 120's again but spontaneously converted and is in NSR at this time with HR in the 90's.    Relevant CV Studies:  Echocardiogram: 11/2015 Study Conclusions   - Left ventricle: The cavity size was normal. Wall thickness was  normal. Systolic function was normal. The estimated ejection  fraction was in the range of 55% to 60%. Although no diagnostic  regional wall motion abnormality  was identified, this possibility  cannot be completely excluded  on the basis of this study.  - Ventricular septum: Septal motion showed abnormal function,  dyssynergy, and paradox. The contour showed systolic flattening.  These changes are consistent with RV pressure overload.   Impressions:   - The systolic PA pressure could not be estimated due to the  absence of TR jet. The 2D findings are suspicious for severely  elevated PA pressure.   Laboratory Data:  High Sensitivity Troponin:   Recent Labs  Lab 07/12/20 1727 07/12/20 1904  TROPONINIHS 6 6     Chemistry Recent Labs  Lab 07/12/20 1727 07/13/20 0542  NA 137 136  K 4.5 3.8  CL 91* 91*  CO2 39* 36*  GLUCOSE 106* 93  BUN 13 13  CREATININE 0.77 0.70  CALCIUM 9.1 8.9  GFRNONAA >60 >60  ANIONGAP 7 9    Recent Labs  Lab 07/12/20 1727 07/13/20 0542  PROT 7.2 6.9  ALBUMIN 3.7 3.5  AST 18 17  ALT 15 15  ALKPHOS 93 86  BILITOT 0.5 0.7   Hematology Recent Labs  Lab 07/12/20 1727 07/13/20 0542  WBC 12.1* 9.7  RBC 4.19* 4.07*  HGB 13.0 12.5*  HCT 43.1 41.2  MCV 102.9* 101.2*  MCH 31.0 30.7  MCHC 30.2 30.3  RDW 12.7 12.7  PLT 204 200   BNP Recent Labs  Lab 07/12/20 1729  BNP 95.0    DDimer No results for input(s): DDIMER in the last 168 hours.   Radiology/Studies:  DG Chest Portable 1 View  Result Date: 07/12/2020 CLINICAL DATA:  Shortness of breath, chest pain EXAM: PORTABLE CHEST 1 VIEW COMPARISON:  11/09/2017 FINDINGS: There is hyperinflation of the lungs compatible with COPD. Heart is normal size. Aortic atherosclerosis. Scarring in the lung bases. No acute confluent opacities or effusions. IMPRESSION: COPD/chronic changes.  No active disease. Electronically Signed   By: Rolm Baptise M.D.   On: 07/12/2020 17:33     Assessment and Plan:   1. Paroxysmal Atrial Flutter - He has a history of paroxysmal atrial flutter occurring in 2017 but did not experience recurrence and this was  discontinued in 02/2018. He has experienced intermittent palpitations over the past week and has been having paroxysmal atrial flutter by review of telemetry but spontaneously converts back to NSR.  - Electrolytes WNL. Will check TSH. Echocardiogram pending to assess for any structural abnormalities. Will obtain a repeat 12-Lead this AM as his telemetry is challenging to interpret at times but would not plan for a DCCV today given his spontaneous conversions back to NSR.  - He has been continued on PTA Cardizem CD 240mg  daily (continue for now unless EF reduced by echo) and Eliquis 5mg  BID for anticoagulation. Will review with Dr. Domenic Polite in regards to restarting Amiodarone since he tolerated this well in the past (he does have underlying COPD so will need to monitor closely). Could also consider ablation and the patient has researched this in the past and would be open to pursuing it if deemed a candidate.   2. Lower Extremity Edema - He reports worsening edema over the past week which is likely secondary to his elevated HR. BNP at 95 on admission and he has trace edema bilaterally but no rales on examination. Currently receiving low-dose IV Lasix 20mg  BID and reports good urine output with this (-1.0 L yesterday but output not recorded this AM). Would plan to resume his PTA PO Lasix at discharge. Echocardiogram is pending to assess LV function and wall motion.   3. Hypothyroidism -  Was continued on PTA Synthroid 50 mcg daily. Will recheck TSH in the setting of likely restarting Amiodarone. LFT's were WNL.     4. HLD - Followed by his PCP as an outpatient. On Crestor 10mg  daily.   5. COPD - On 3L [place] at baseline. Followed by Dr. Melvyn Novas as an outpatient.    Risk Assessment/Risk Scores:    CHA2DS2-VASc Score = 3  This indicates a 3.2% annual risk of stroke. The patient's score is based upon: CHF History: No HTN History: Yes Diabetes History: No Stroke History: No Vascular Disease History:  No Age Score: 2 Gender Score: 0     For questions or updates, please contact [place] Please consult [email or web address] for contact info under    Signed, HANNIEL PICARDO, PA-C  07/13/2020 8:45 AM    Attending note:  Patient seen and examined.  I reviewed his records and discussed the case with Ms. Mandala PA-C.  David Stein has a history of paroxysmal atrial flutter, CHA2DS2-VASc score of 3.  He was previously on amiodarone for rhythm suppression, ultimately discontinued in 2019 without significant recurrences.  He does have COPD at baseline, recently stable.  He now presents with recurring atrial flutter associated with RVR, weakness and shortness of breath.  He has only recently been experiencing intermittent palpitations over the last week or so.  On examination this morning he appears comfortable, does remain in atrial flutter with heart rate in the 110-120 range, intermittently converting to sinus rhythm however.  Lungs exhibit decreased breath sounds without crackles.  Cardiac exam with irregularly irregular rhythm, no gallop.  Pertinent lab work includes potassium 3.8, BUN 13, creatinine 0.7, LFTs normal, BNP 95, high-sensitivity troponin I levels normal, hemoglobin 12.5, platelets 200, TSH 1.329, SARS coronavirus 2 test negative.  Chest x-[place] reports chronic changes consistent with COPD, no acute findings.  Recent ECGs have been captured while the patient is in sinus rhythm.  He has what looks to be atypical atrial flutter by telemetry and old tracings.  Patient presents with recurrent, paroxysmal atypical atrial flutter with CHA2DS2-VASc score of 3.  No evidence of ACS.  Echocardiogram pending for follow-up evaluation of LVEF.  At this point would recommend resuming oral amiodarone beginning with a load at 400 mg twice daily for 1 week, then reducing to 200 mg daily.  Would continue Eliquis for stroke prophylaxis, also Cardizem CD although dose may ultimately need to be reduced  depending on heart rate in sinus rhythm while on amiodarone.  He would likely benefit from EP consultation as an outpatient for discussion of potential ablation - we will get this set up.  Would anticipate discharge in the next 12 to 24 hours depending on rhythm stability.  Satira Sark, M.D., F.A.C.C.

## 2020-07-13 NOTE — Discharge Summary (Signed)
Physician Discharge Summary  David Stein DOB: [birth year] DOA: 07/12/2020  PCP: [facility]  Admit date: 07/12/2020  Discharge date: 07/13/2020  Admitted From:Home  Disposition:  Home  Recommendations for Outpatient Follow-up:  1. Follow up with PCP in 1-2 weeks 2. Follow-up with Dr. Harl Bowie as scheduled on 5/12 3. Continue on amiodarone as prescribed with 400 mg twice a day for 7 days and then 200 mg daily thereafter 4. Continue home Cardizem and Eliquis as prior  Home Health: None  Equipment/Devices:None  Discharge Condition:Stable  CODE STATUS: Full  Diet recommendation: Heart Healthy  Brief/Interim Summary:  David Stein is a 75 y.o. male with medical history significant for.  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  Patient was admitted with paroxysmal atrial flutter as well as some mild volume overload.  He has had intermittent palpitations over the last week and was noted to spontaneously convert back to NSR on occasion.  TSH is within normal limits and 2D echocardiogram with LVEF 55-60% and grade 1 diastolic dysfunction noted.  He has been started on oral amiodarone with loading dose as noted above and is currently remaining in normal sinus rhythm.  He has been seen by cardiology and appears to be appropriate for discharge today.  He will continue on amiodarone as prescribed with close follow-up to Dr. Harl Bowie as arranged on 5/12.  He may likely require electrophysiology evaluation in the near future.  Discharge Diagnoses:  Principal Problem:   Atrial flutter ([place]) Active Problems:   COPD (chronic obstructive pulmonary disease) (HCC)   Chronic respiratory failure with hypoxia (HCC)   Leukocytosis   Macrocytic anemia   Hypothyroidism   Hyperlipidemia  Principal  discharge diagnosis: Paroxysmal atrial flutter with minimal volume overload.  Discharge Instructions  Discharge Instructions    Diet - low sodium heart healthy   Complete by: As directed    Increase activity slowly   Complete by: As directed      Allergies as of 07/13/2020      Reactions   Codeine Other (See Comments)   headache      Medication List    TAKE these medications   acetaminophen 500 MG tablet Commonly known as: TYLENOL Take 500 mg by mouth every 6 (six) hours as needed.   albuterol (2.5 MG/3ML) 0.083% nebulizer solution Commonly known as: PROVENTIL Take 2.5 mg by nebulization every 6 (six) hours as needed for wheezing or shortness of breath.   ProAir HFA 108 (90 Base) MCG/ACT inhaler Generic drug: albuterol Inhale 1 puff into the lungs every 6 (six) hours as needed for wheezing or shortness of breath.   ALPRAZOLAM PO Take 1 tablet by mouth as needed.   ALPRAZolam 0.5 MG tablet Commonly known as: XANAX Take 0.5 mg by mouth every 8 (eight) hours as needed for anxiety.   amiodarone 200 MG tablet Commonly known as: PACERONE Take two tabs twice a day for 7 days and then one tab daily thereafter.   antiseptic oral rinse Liqd 15 mLs by Mouth Rinse route as needed for dry mouth.   apixaban 5 MG Tabs tablet Commonly known as: ELIQUIS Take 1 tablet (5 mg total) by mouth 2 (two) times daily.   budesonide-formoterol 160-4.5 MCG/ACT inhaler Commonly known as: SYMBICORT Inhale 2 puffs into the lungs 2 (two) times daily.   clotrimazole-betamethasone cream Commonly  known as: LOTRISONE Apply 1 application topically daily.   diltiazem 240 MG 24 hr capsule Commonly known as: CARDIZEM CD TAKE (1) CAPSULE BY MOUTH EVERY DAY. What changed: See the new instructions.   ferrous sulfate 325 (65 FE) MG tablet Take 325 mg by mouth daily with breakfast.   fluocinonide cream 0.05 % Commonly known as: LIDEX Apply topically 3 (three) times daily as needed.    furosemide 20 MG tablet Commonly known as: LASIX TAKE ONE TABLET BY MOUTH DAILY.MAY TAKE AN ADDITIONAL TABLET AS NEEDED.   guaiFENesin 600 MG 12 hr tablet Commonly known as: MUCINEX Take 1 tablet (600 mg total) by mouth 2 (two) times daily.   levothyroxine 50 MCG tablet Commonly known as: SYNTHROID Take 50 mcg by mouth daily before breakfast.   multivitamin with minerals Tabs tablet Take 1 tablet by mouth daily.   rosuvastatin 10 MG tablet Commonly known as: CRESTOR Take 10 mg by mouth daily.   Spiriva Respimat 2.5 MCG/ACT Aers Generic drug: Tiotropium Bromide Monohydrate Inhale 2 puffs into the lungs daily.   traZODone 50 MG tablet Commonly known as: DESYREL Take 50 mg by mouth at bedtime.   VITAMIN D PO Take 1 tablet by mouth daily.       Follow-up Information    [facility] Follow up in 1 week(s).   Specialty: Family Medicine Contact information: [address] [place] [address] [phone]        Arnoldo Lenis, MD Follow up on 08/02/2020.   Specialty: Cardiology Contact information: [place] [address] [phone]              Allergies  Allergen Reactions  . Codeine Other (See Comments)    headache    Consultations:  Cardiology   Procedures/Studies: DG Chest Portable 1 View  Result Date: 07/12/2020 CLINICAL DATA:  Shortness of breath, chest pain EXAM: PORTABLE CHEST 1 VIEW COMPARISON:  11/09/2017 FINDINGS: There is hyperinflation of the lungs compatible with COPD. Heart is normal size. Aortic atherosclerosis. Scarring in the lung bases. No acute confluent opacities or effusions. IMPRESSION: COPD/chronic changes.  No active disease. Electronically Signed   By: Rolm Baptise M.D.   On: 07/12/2020 17:33   ECHOCARDIOGRAM COMPLETE  Result Date: 07/13/2020    ECHOCARDIOGRAM REPORT   Patient Name:   David Stein Date of Exam: 07/13/2020 Medical Rec #:  [id number]     Height:        73.0 in Accession #:    [id number]    Weight:       210.0 lb Date of Birth:  1945-06-09      BSA:          2.197 m Patient Age:    75 years      BP:           120/86 mmHg Patient Gender: M             HR:           90 bpm. Exam Location:  [facility] Procedure: 2D Echo and Intracardiac Opacification Agent Indications:    Chest Pain R07.9  History:        Patient has prior history of Echocardiogram examinations, most                 recent 12/09/2015. COPD, Arrythmias:Atrial Flutter; Risk                 Factors:Former Smoker  and Dyslipidemia.  Sonographer:    Leavy Cella RDCS (AE) Referring Phys: [id number] OLADAPO ADEFESO IMPRESSIONS  1. Left ventricular ejection fraction, by estimation, is approximately 55 to 60%. The left ventricle has normal function. Left ventricular endocardial border not optimally defined to evaluate regional wall motion. There is mild left ventricular hypertrophy. Left ventricular diastolic parameters are consistent with Grade I diastolic dysfunction (impaired relaxation).  2. Right ventricular systolic function is severely reduced. The right ventricular size is moderately enlarged. Tricuspid regurgitation signal is inadequate for assessing PA pressure.  3. The mitral valve is grossly normal. Trivial mitral valve regurgitation.  4. The aortic valve is tricuspid. Aortic valve regurgitation is not visualized.  5. The inferior vena cava is dilated in size with >50% respiratory variability, suggesting right atrial pressure of 8 mmHg. FINDINGS  Left Ventricle: Left ventricular ejection fraction, by estimation, is 55 to 60%. The left ventricle has normal function. Left ventricular endocardial border not optimally defined to evaluate regional wall motion. The left ventricular internal cavity size was normal in size. There is mild left ventricular hypertrophy. Left ventricular diastolic parameters are consistent with Grade I diastolic dysfunction (impaired relaxation). Right Ventricle: The right  ventricular size is moderately enlarged. No increase in right ventricular wall thickness. Right ventricular systolic function is severely reduced. Tricuspid regurgitation signal is inadequate for assessing PA pressure. Left Atrium: Left atrial size was normal in size. Right Atrium: Right atrial size was normal in size. Pericardium: There is no evidence of pericardial effusion. Mitral Valve: The mitral valve is grossly normal. Mild mitral annular calcification. Trivial mitral valve regurgitation. Tricuspid Valve: The tricuspid valve is grossly normal. Tricuspid valve regurgitation is trivial. Aortic Valve: The aortic valve is tricuspid. There is mild aortic valve annular calcification. Aortic valve regurgitation is not visualized. Pulmonic Valve: The pulmonic valve was not well visualized. Pulmonic valve regurgitation is not visualized. Aorta: The aortic root is normal in size and structure. Venous: The inferior vena cava is dilated in size with greater than 50% respiratory variability, suggesting right atrial pressure of 8 mmHg. IAS/Shunts: No atrial level shunt detected by color flow Doppler.  LEFT VENTRICLE PLAX 2D LVIDd:         4.42 cm  Diastology LVIDs:         2.50 cm  LV e' medial:    6.96 cm/s LV PW:         1.13 cm  LV E/e' medial:  7.8 LV IVS:        1.03 cm  LV e' lateral:   5.44 cm/s LVOT diam:     2.00 cm  LV E/e' lateral: 10.0 LVOT Area:     3.14 cm  RIGHT VENTRICLE RV S prime:     12.10 cm/s TAPSE (M-mode): 2.2 cm LEFT ATRIUM           Index       RIGHT ATRIUM           Index LA diam:      2.80 cm 1.27 cm/m  RA Area:     11.20 cm LA Vol (A4C): 23.8 ml 10.83 ml/m RA Volume:   23.70 ml  10.79 ml/m   AORTA Ao Root diam: 3.00 cm MITRAL VALVE MV Area (PHT): 2.82 cm    SHUNTS MV Decel Time: 269 msec    Systemic Diam: 2.00 cm MV E velocity: 54.20 cm/s MV A velocity: 70.00 cm/s MV E/A ratio:  0.77 Rozann Lesches MD Electronically signed by Rozann Lesches MD Signature  Date/Time: 07/13/2020/12:32:58 PM     Final       Discharge Exam: Vitals:   07/13/20 0533 07/13/20 0717  BP: 130/86   Pulse: (!) 120   Resp: 20   Temp: 98 F (36.7 C)   SpO2: 95% 95%   Vitals:   07/12/20 2300 07/12/20 2340 07/13/20 0533 07/13/20 0717  BP: 130/79 107/80 130/86   Pulse: 83 99 (!) 120   Resp: (!) 21 20 20    Temp:  97.8 F (36.6 C) 98 F (36.7 C)   TempSrc:      SpO2: 93% 95% 95% 95%  Weight:      Height:        General: Pt is alert, awake, not in acute distress Cardiovascular: RRR, S1/S2 +, no rubs, no gallops Respiratory: CTA bilaterally, no wheezing, no rhonchi, on 3 L nasal cannula oxygen Abdominal: Soft, NT, ND, bowel sounds + Extremities: no edema, no cyanosis    The results of significant diagnostics from this hospitalization (including imaging, microbiology, ancillary and laboratory) are listed below for reference.     Microbiology: Recent Results (from the past 240 hour(s))  Resp Panel by RT-PCR (Flu A&B, Covid) Nasopharyngeal Swab     Status: None   Collection Time: 07/12/20  4:44 PM   Specimen: Nasopharyngeal Swab; Nasopharyngeal(NP) swabs in vial transport medium  Result Value Ref Range Status   SARS Coronavirus 2 by RT PCR NEGATIVE NEGATIVE Final    Comment: (NOTE) SARS-CoV-2 target nucleic acids are NOT DETECTED.  The SARS-CoV-2 RNA is generally detectable in upper respiratory specimens during the acute phase of infection. The lowest concentration of SARS-CoV-2 viral copies this assay can detect is 138 copies/mL. A negative result does not preclude SARS-Cov-2 infection and should not be used as the sole basis for treatment or other patient management decisions. A negative result may occur with  improper specimen collection/handling, submission of specimen other than nasopharyngeal swab, presence of viral mutation(s) within the areas targeted by this assay, and inadequate number of viral copies(<138 copies/mL). A negative result must be combined with clinical  observations, patient history, and epidemiological information. The expected result is Negative.  Fact Sheet for Patients:  EntrepreneurPulse.com.au  Fact Sheet for Healthcare Providers:  IncredibleEmployment.be  This test is no t yet approved or cleared by the [place] FDA and  has been authorized for detection and/or diagnosis of SARS-CoV-2 by FDA under an Emergency Use Authorization (EUA). This EUA will remain  in effect (meaning this test can be used) for the duration of the COVID-19 declaration under Section 564(b)(1) of the Act, 21 U.S.C.section 360bbb-3(b)(1), unless the authorization is terminated  or revoked sooner.       Influenza A by PCR NEGATIVE NEGATIVE Final   Influenza B by PCR NEGATIVE NEGATIVE Final    Comment: (NOTE) The Xpert Xpress SARS-CoV-2/FLU/RSV plus assay is intended as an aid in the diagnosis of influenza from Nasopharyngeal swab specimens and should not be used as a sole basis for treatment. Nasal washings and aspirates are unacceptable for Xpert Xpress SARS-CoV-2/FLU/RSV testing.  Fact Sheet for Patients: EntrepreneurPulse.com.au  Fact Sheet for Healthcare Providers: IncredibleEmployment.be  This test is not yet approved or cleared by the [place] FDA and has been authorized for detection and/or diagnosis of SARS-CoV-2 by FDA under an Emergency Use Authorization (EUA). This EUA will remain in effect (meaning this test can be used) for the duration of the COVID-19 declaration under Section 564(b)(1) of the Act, 21 U.S.C. section 360bbb-3(b)(1), unless  the authorization is terminated or revoked.  Performed at [facility], [address] [place], [place] [address]      Labs: BNP (last 3 results) Recent Labs    07/12/20 1729  BNP 93.2   Basic Metabolic Panel: Recent Labs  Lab 07/12/20 1727 07/13/20 0542  NA 137 136  K 4.5 3.8  CL 91* 91*  CO2 39* 36*   GLUCOSE 106* 93  BUN 13 13  CREATININE 0.77 0.70  CALCIUM 9.1 8.9  MG 2.1 2.0  PHOS  --  4.3   Liver Function Tests: Recent Labs  Lab 07/12/20 1727 07/13/20 0542  AST 18 17  ALT 15 15  ALKPHOS 93 86  BILITOT 0.5 0.7  PROT 7.2 6.9  ALBUMIN 3.7 3.5   No results for input(s): LIPASE, AMYLASE in the last 168 hours. No results for input(s): AMMONIA in the last 168 hours. CBC: Recent Labs  Lab 07/12/20 1727 07/13/20 0542  WBC 12.1* 9.7  NEUTROABS 8.5*  --   HGB 13.0 12.5*  HCT 43.1 41.2  MCV 102.9* 101.2*  PLT 204 200   Cardiac Enzymes: No results for input(s): CKTOTAL, CKMB, CKMBINDEX, TROPONINI in the last 168 hours. BNP: Invalid input(s): POCBNP CBG: No results for input(s): GLUCAP in the last 168 hours. D-Dimer No results for input(s): DDIMER in the last 72 hours. Hgb A1c No results for input(s): HGBA1C in the last 72 hours. Lipid Profile No results for input(s): CHOL, HDL, LDLCALC, TRIG, CHOLHDL, LDLDIRECT in the last 72 hours. Thyroid function studies Recent Labs    07/13/20 0542  TSH 1.329   Anemia work up Recent Labs    07/13/20 0542  VITAMINB12 286  FOLATE 9.8   Urinalysis No results found for: COLORURINE, APPEARANCEUR, LABSPEC, [place], GLUCOSEU, HGBUR, BILIRUBINUR, KETONESUR, PROTEINUR, UROBILINOGEN, NITRITE, LEUKOCYTESUR Sepsis Labs Invalid input(s): PROCALCITONIN,  WBC,  LACTICIDVEN Microbiology Recent Results (from the past 240 hour(s))  Resp Panel by RT-PCR (Flu A&B, Covid) Nasopharyngeal Swab     Status: None   Collection Time: 07/12/20  4:44 PM   Specimen: Nasopharyngeal Swab; Nasopharyngeal(NP) swabs in vial transport medium  Result Value Ref Range Status   SARS Coronavirus 2 by RT PCR NEGATIVE NEGATIVE Final    Comment: (NOTE) SARS-CoV-2 target nucleic acids are NOT DETECTED.  The SARS-CoV-2 RNA is generally detectable in upper respiratory specimens during the acute phase of infection. The lowest concentration of SARS-CoV-2  viral copies this assay can detect is 138 copies/mL. A negative result does not preclude SARS-Cov-2 infection and should not be used as the sole basis for treatment or other patient management decisions. A negative result may occur with  improper specimen collection/handling, submission of specimen other than nasopharyngeal swab, presence of viral mutation(s) within the areas targeted by this assay, and inadequate number of viral copies(<138 copies/mL). A negative result must be combined with clinical observations, patient history, and epidemiological information. The expected result is Negative.  Fact Sheet for Patients:  EntrepreneurPulse.com.au  Fact Sheet for Healthcare Providers:  IncredibleEmployment.be  This test is no t yet approved or cleared by the [place] FDA and  has been authorized for detection and/or diagnosis of SARS-CoV-2 by FDA under an Emergency Use Authorization (EUA). This EUA will remain  in effect (meaning this test can be used) for the duration of the COVID-19 declaration under Section 564(b)(1) of the Act, 21 U.S.C.section 360bbb-3(b)(1), unless the authorization is terminated  or revoked sooner.       Influenza A by PCR NEGATIVE  NEGATIVE Final   Influenza B by PCR NEGATIVE NEGATIVE Final    Comment: (NOTE) The Xpert Xpress SARS-CoV-2/FLU/RSV plus assay is intended as an aid in the diagnosis of influenza from Nasopharyngeal swab specimens and should not be used as a sole basis for treatment. Nasal washings and aspirates are unacceptable for Xpert Xpress SARS-CoV-2/FLU/RSV testing.  Fact Sheet for Patients: EntrepreneurPulse.com.au  Fact Sheet for Healthcare Providers: IncredibleEmployment.be  This test is not yet approved or cleared by the [place] FDA and has been authorized for detection and/or diagnosis of SARS-CoV-2 by FDA under an Emergency Use Authorization  (EUA). This EUA will remain in effect (meaning this test can be used) for the duration of the COVID-19 declaration under Section 564(b)(1) of the Act, 21 U.S.C. section 360bbb-3(b)(1), unless the authorization is terminated or revoked.  Performed at [facility], [address], [place], [place] [address]      Time coordinating discharge: 35 minutes  SIGNED:   Rodena Goldmann, DO [facility] 07/13/2020, 1:40 PM  If 7PM-7AM, please contact night-coverage [email or web address]

## 2020-08-02 ENCOUNTER — Ambulatory Visit: Payer: Medicare Other | Admitting: Cardiology

## 2020-08-02 ENCOUNTER — Encounter: Payer: Self-pay | Admitting: Cardiology

## 2020-08-02 VITALS — BP 120/56 | HR 76 | Ht 73.0 in | Wt 204.0 lb

## 2020-08-02 DIAGNOSIS — I4892 Unspecified atrial flutter: Secondary | ICD-10-CM | POA: Diagnosis not present

## 2020-08-02 DIAGNOSIS — I5082 Biventricular heart failure: Secondary | ICD-10-CM | POA: Diagnosis not present

## 2020-08-02 DIAGNOSIS — I5020 Unspecified systolic (congestive) heart failure: Secondary | ICD-10-CM

## 2020-08-02 MED ORDER — M-4 KNEE HIGH STOCKINGS MISC: 1.0000 | 0 refills | Status: DC

## 2020-08-02 NOTE — Progress Notes (Signed)
Clinical Summary David David Stein is a 75 y.o.male  seen today for follow up of the following medical problems.  1. Aflutter - we previously stopped amio as he was doing well, initially started during admission in 2017 with pneumonia where his aflutter was difficult to control   - no recent palpitations, improved with increased diltiazem dose last visit - no bleeding on eliquis.  - admitted 06/2020 with palpitations, found to be in aflutter - paroxysmal aflutter during admission, DCCV not pursued - started on oral amio during that admission  - home heart rates typically 70s. - no palpitations.    2. COPD - followed by Dr Melvyn Novas, on home O2 3L  3. 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 Overall swelling is controlled   Past Medical History:  Diagnosis Date  . Arthritis   . Atrial flutter ([place])   . COPD (chronic obstructive pulmonary disease) ([place])   . Kidney stones   . 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 Restless leg syndrome   . Shortness of breath dyspnea      Allergies  Allergen Reactions  . Codeine Other (See Comments)    headache     Current Outpatient Medications  Medication Sig Dispense Refill  . acetaminophen (TYLENOL) 500 MG tablet Take 500 mg by mouth every 6 (six) hours as needed.    Marland Kitchen albuterol (PROVENTIL) (2.5 MG/3ML) 0.083% nebulizer solution Take 2.5 mg by nebulization every 6 (six) hours as needed for wheezing or shortness of breath.    . ALPRAZolam (XANAX) 0.5 MG tablet Take 0.5 mg by mouth every 8 (eight) hours as needed for anxiety.    . ALPRAZOLAM PO Take 1 tablet by mouth as needed. (Patient not taking: No sig reported)    . amiodarone (PACERONE) 200 MG tablet Take two tabs twice a day for 7 days and then one tab daily thereafter. 90 tablet 1  . antiseptic oral rinse (BIOTENE) LIQD 15 mLs by Mouth Rinse route as needed for dry  mouth.    Marland Kitchen apixaban (ELIQUIS) 5 MG TABS tablet Take 1 tablet (5 mg total) by mouth 2 (two) times daily. 60 tablet 0  . budesonide-formoterol (SYMBICORT) 160-4.5 MCG/ACT inhaler Inhale 2 puffs into the lungs 2 (two) times daily.    . clotrimazole-betamethasone (LOTRISONE) cream Apply 1 application topically daily.    Marland Kitchen diltiazem (CARDIZEM CD) 240 MG 24 hr capsule TAKE (1) CAPSULE BY MOUTH EVERY DAY. (Patient taking differently: Take 240 mg by mouth daily.) 90 capsule 2  . ferrous sulfate 325 (65 FE) MG tablet Take 325 mg by mouth daily with breakfast.    . fluocinonide cream (LIDEX) 0.05 % Apply topically 3 (three) times daily as needed.    . furosemide (LASIX) 20 MG tablet TAKE ONE TABLET BY MOUTH DAILY.MAY TAKE AN ADDITIONAL TABLET AS NEEDED. 90 tablet 3  . guaiFENesin (MUCINEX) 600 MG 12 hr tablet Take 1 tablet (600 mg total) by mouth 2 (two) times daily.    Marland Kitchen levothyroxine (SYNTHROID, LEVOTHROID) 50 MCG tablet Take 50 mcg by mouth daily before breakfast.    . Multiple Vitamin (MULTIVITAMIN WITH MINERALS) TABS tablet Take 1 tablet by mouth daily. (Patient not taking: Reported on 07/12/2020)    . PROAIR HFA 108 (90 Base) MCG/ACT inhaler Inhale 1 puff into the lungs every 6 (six) hours as needed for wheezing or shortness  of breath.    . rosuvastatin (CRESTOR) 10 MG tablet Take 10 mg by mouth daily.    . Tiotropium Bromide Monohydrate (SPIRIVA RESPIMAT) 2.5 MCG/ACT AERS Inhale 2 puffs into the lungs daily. 4 g 0  . traZODone (DESYREL) 50 MG tablet Take 50 mg by mouth at bedtime.    Marland Kitchen VITAMIN D PO Take 1 tablet by mouth daily.     No current facility-administered medications for this visit.     Past Surgical History:  Procedure Laterality Date  . CATARACT EXTRACTION W/PHACO Right 01/31/2014   Procedure: CATARACT EXTRACTION PHACO AND INTRAOCULAR LENS PLACEMENT RIGHT EYE CDE=6.90;  Surgeon: Elta Guadeloupe T. Gershon Crane, MD;  Location: [facility];  Service: Ophthalmology;  Laterality: Right;  . CATARACT  EXTRACTION W/PHACO Left 02/14/2014   Procedure: CATARACT EXTRACTION PHACO AND INTRAOCULAR LENS PLACEMENT; CDE:  8.34;  Surgeon: Elta Guadeloupe T. Gershon Crane, MD;  Location: [facility];  Service: Ophthalmology;  Laterality: Left;  . KNEE SURGERY Left      Allergies  Allergen Reactions  . Codeine Other (See Comments)    headache      Family History  Problem Relation Age of Onset  . Rheumatic fever Father   . Alzheimer's disease Mother   . Stroke Sister      Social History David David Stein reports that he quit smoking about 4 years ago. His smoking use included cigarettes. He has a 13.75 pack-year smoking history. He has never used smokeless tobacco. David David Stein reports no history of alcohol use.   Review of Systems CONSTITUTIONAL: No weight loss, fever, chills, weakness or fatigue.  HEENT: Eyes: No visual loss, blurred vision, double vision or yellow sclerae.No hearing loss, sneezing, congestion, runny nose or sore throat.  SKIN: No rash or itching.  CARDIOVASCULAR: per hpi RESPIRATORY: chronic SOB GASTROINTESTINAL: No anorexia, nausea, vomiting or diarrhea. No abdominal pain or blood.  GENITOURINARY: No burning on urination, no polyuria NEUROLOGICAL: No headache, dizziness, syncope, paralysis, ataxia, numbness or tingling in the extremities. No change in bowel or bladder control.  MUSCULOSKELETAL: No muscle, back pain, joint pain or stiffness.  LYMPHATICS: No enlarged nodes. No history of splenectomy.  PSYCHIATRIC: No history of depression or anxiety.  ENDOCRINOLOGIC: No reports of sweating, cold or heat intolerance. No polyuria or polydipsia.  Marland Kitchen   Physical Examination Today's Vitals   08/02/20 1321  BP: (!) 120/56  Pulse: 76  SpO2: 96%  Weight: 204 lb (92.5 kg)  Height: 6\' 1"  (1.854 m)   Body mass index is 26.91 kg/m.  Gen: resting comfortably, no acute distress HEENT: no scleral icterus, pupils equal round and reactive, no palptable cervical adenopathy,  CV: RRR, no m/r/gl,n o  jvd Resp: Clear to auscultation bilaterally GI: abdomen is soft, non-tender, non-distended, normal bowel sounds, no hepatosplenomegaly MSK: extremities are warm, no edema.  Skin: warm, no rash Neuro:  no focal deficits Psych: appropriate affect   Diagnostic Studies 11/2015 echo Study Conclusions  - Left ventricle: The cavity size was normal. Wall thickness was normal. Systolic function was normal. The estimated ejection fraction was in the range of 55% to 60%. Although no diagnostic regional wall motion abnormality was identified, this possibility cannot be completely excluded on the basis of this study. - Ventricular septum: Septal motion showed abnormal function, dyssynergy, and paradox. The contour showed systolic flattening. These changes are consistent with RV pressure overload.  Impressions:  - The systolic PA pressure could not be estimated due to the absence of TR jet. The 2D findings are suspicious for  severely elevated PA pressure.   06/2020 echo IMPRESSIONS    1. Left ventricular ejection fraction, by estimation, is approximately 55  to 60%. The left ventricle has normal function. Left ventricular  endocardial border not optimally defined to evaluate regional wall motion.  There is mild left ventricular  hypertrophy. Left ventricular diastolic parameters are consistent with  Grade I diastolic dysfunction (impaired relaxation).  2. Right ventricular systolic function is severely reduced. The right  ventricular size is moderately enlarged. Tricuspid regurgitation signal is  inadequate for assessing PA pressure.  3. The mitral valve is grossly normal. Trivial mitral valve  regurgitation.  4. The aortic valve is tricuspid. Aortic valve regurgitation is not  visualized.  5. The inferior vena cava is dilated in size with >50% respiratory  variability, suggesting right atrial pressure of 8 mmHg.    Assessment and Plan  1. Aflutter,  paroxysmal atypical - symptoms much improved since starting amio during admission - will continue for now. Have him see EP to see if any long term alternatives given his lung disease or if amio is best continued option - continue anticoag - he is not able to travel to [facility] for new patient appointment, will see if can do a new patient appointment in [place] with Dr Lovena Le - EKG today shows SR   2. LE edema/RV failure with cor pulmonale - overall edema is controlled, he has asked for compresson stockings which we will write for    F/u 4 months  Arnoldo Lenis, M.D.

## 2020-08-02 NOTE — Addendum Note (Signed)
Addended by: Julian Hy T on: 08/02/2020 02:04 PM   Modules accepted: Orders

## 2020-08-02 NOTE — Patient Instructions (Signed)
Your physician recommends that you schedule a follow-up appointment in: [place]  Your physician recommends that you continue on your current medications as directed. Please refer to the Current Medication list given to you today.  COMPRESSION STOCKINGS SENT TO PHARMACY   You have been referred to ELECTROPHYSIOLOGY   Thank you for choosing [facility]!!

## 2020-08-02 NOTE — Addendum Note (Signed)
Addended by: Julian Hy T on: 08/02/2020 02:03 PM   Modules accepted: Orders

## 2020-08-02 NOTE — Addendum Note (Signed)
Addended by: Merlene Laughter on: 08/02/2020 02:02 PM   Modules accepted: Orders

## 2020-08-22 ENCOUNTER — Other Ambulatory Visit: Payer: Self-pay

## 2020-08-22 ENCOUNTER — Encounter: Payer: Self-pay | Admitting: Internal Medicine

## 2020-08-22 ENCOUNTER — Ambulatory Visit: Payer: Medicare Other | Admitting: Internal Medicine

## 2020-08-22 VITALS — BP 110/60 | HR 68 | Ht 73.0 in | Wt 208.0 lb

## 2020-08-22 DIAGNOSIS — I4892 Unspecified atrial flutter: Secondary | ICD-10-CM

## 2020-08-22 MED ORDER — AMIODARONE HCL 200 MG PO TABS: 100.0000 mg | ORAL_TABLET | Freq: Every day | ORAL | 3 refills | Status: DC

## 2020-08-22 NOTE — Progress Notes (Signed)
HPI David Stein is referred today by Dr. Harl Bowie for evaluation of atrial flutter. He is a pleasant 75 yo man with a h/o oxygen dependent COPD and initial atrial flutter back in 2017. At that time he was placed on amiodarone and was kept on the amiodarone for about a year. He was stopped and did well until earlier this year when he developed recurrent atrial flutter. He was placed back on amiodarone 200 mg daily. He has not had more in the way of symptomatic atrial flutter. He denies a cough and his chronic dyspnea has not worsened. The patient denies sycnope. No cough. He developed peripheral edema with atrial flutter but this has been quiet since maintaining NSR.  Allergies  Allergen Reactions  . Codeine Other (See Comments)    headache     Current Outpatient Medications  Medication Sig Dispense Refill  . acetaminophen (TYLENOL) 500 MG tablet Take 500 mg by mouth every 6 (six) hours as needed.    Marland Kitchen albuterol (PROVENTIL) (2.5 MG/3ML) 0.083% nebulizer solution Take 2.5 mg by nebulization every 6 (six) hours as needed for wheezing or shortness of breath.    . ALPRAZolam (XANAX) 0.5 MG tablet Take 0.5 mg by mouth every 8 (eight) hours as needed for anxiety.    . ALPRAZOLAM PO Take 1 tablet by mouth as needed.    Marland Kitchen amiodarone (PACERONE) 200 MG tablet Take two tabs twice a day for 7 days and then one tab daily thereafter. 90 tablet 1  . antiseptic oral rinse (BIOTENE) LIQD 15 mLs by Mouth Rinse route as needed for dry mouth.    Marland Kitchen apixaban (ELIQUIS) 5 MG TABS tablet Take 1 tablet (5 mg total) by mouth 2 (two) times daily. 60 tablet 0  . budesonide-formoterol (SYMBICORT) 160-4.5 MCG/ACT inhaler Inhale 2 puffs into the lungs 2 (two) times daily.    . clotrimazole-betamethasone (LOTRISONE) cream Apply 1 application topically daily.    Marland Kitchen diltiazem (CARDIZEM CD) 240 MG 24 hr capsule TAKE (1) CAPSULE BY MOUTH EVERY DAY. 90 capsule 2  . Elastic Bandages & Supports (M-4 KNEE HIGH STOCKINGS) MISC 1  each by Does not apply route as directed. 1 each 0  . ferrous sulfate 325 (65 FE) MG tablet Take 325 mg by mouth daily with breakfast.    . fluocinonide cream (LIDEX) 0.05 % Apply topically 3 (three) times daily as needed.    . furosemide (LASIX) 20 MG tablet TAKE ONE TABLET BY MOUTH DAILY.MAY TAKE AN ADDITIONAL TABLET AS NEEDED. 90 tablet 3  . guaiFENesin (MUCINEX) 600 MG 12 hr tablet Take 1 tablet (600 mg total) by mouth 2 (two) times daily.    Marland Kitchen HYDROcodone-acetaminophen (NORCO) 10-325 MG tablet Take 1 tablet by mouth 4 (four) times daily as needed.    Marland Kitchen levothyroxine (SYNTHROID, LEVOTHROID) 50 MCG tablet Take 50 mcg by mouth daily before breakfast.    . Multiple Vitamin (MULTIVITAMIN WITH MINERALS) TABS tablet Take 1 tablet by mouth daily.    Marland Kitchen PROAIR HFA 108 (90 Base) MCG/ACT inhaler Inhale 1 puff into the lungs every 6 (six) hours as needed for wheezing or shortness of breath.    . rosuvastatin (CRESTOR) 10 MG tablet Take 10 mg by mouth daily.    . Tiotropium Bromide Monohydrate (SPIRIVA RESPIMAT) 2.5 MCG/ACT AERS Inhale 2 puffs into the lungs daily. 4 g 0  . traZODone (DESYREL) 50 MG tablet Take 50 mg by mouth at bedtime.    Marland Kitchen VITAMIN D PO Take  1 tablet by mouth daily.     No current facility-administered medications for this visit.     Past Medical History:  Diagnosis Date  . Arthritis   . Atrial flutter ([place])   . COPD (chronic obstructive pulmonary disease) ([place])   . Kidney stones   . 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 Restless leg syndrome   . Shortness of breath dyspnea     ROS:   All systems reviewed and negative except as noted in the HPI.   Past Surgical History:  Procedure Laterality Date  . CATARACT EXTRACTION W/PHACO Right 01/31/2014   Procedure: CATARACT EXTRACTION PHACO AND INTRAOCULAR LENS PLACEMENT RIGHT EYE CDE=6.90;  Surgeon: Elta Guadeloupe T. Gershon Crane, MD;  Location: [facility];  Service: Ophthalmology;  Laterality: Right;  . CATARACT EXTRACTION W/PHACO Left 02/14/2014    Procedure: CATARACT EXTRACTION PHACO AND INTRAOCULAR LENS PLACEMENT; CDE:  8.34;  Surgeon: Elta Guadeloupe T. Gershon Crane, MD;  Location: [facility];  Service: Ophthalmology;  Laterality: Left;  . KNEE SURGERY Left      Family History  Problem Relation Age of Onset  . Rheumatic fever Father   . Alzheimer's disease Mother   . Stroke Sister      Social History   Socioeconomic History  . Marital status: Married    Spouse name: Not on file  . Number of children: Not on file  . Years of education: Not on file  . Highest education level: Not on file  Occupational History  . Not on file  Tobacco Use  . Smoking status: Former Smoker    Packs/day: 0.25    Years: 55.00    Pack years: 13.75    Types: Cigarettes    Quit date: 12/12/2015    Years since quitting: 4.6  . Smokeless tobacco: Never Used  Substance and Sexual Activity  . Alcohol use: No  . Drug use: No  . Sexual activity: Not Currently  Other Topics Concern  . Not on file  Social History Narrative  . Not on file   Social Determinants of Health   Financial Resource Strain: Not on file  Food Insecurity: Not on file  Transportation Needs: Not on file  Physical Activity: Not on file  Stress: Not on file  Social Connections: Not on file  Intimate Partner Violence: Not on file     BP 110/60   Pulse 68   Ht 6\' 1"  (1.854 m)   Wt 208 lb (94.3 kg)   SpO2 93%   BMI 27.44 kg/m   Physical Exam:  Chronically illl appearing 75 yo man, NAD HEENT: Unremarkable Neck:  6 cm JVD, no thyromegally Lymphatics:  No adenopathy Back:  No CVA tenderness Lungs:  Clear with no wheezes HEART:  Regular rate rhythm, no murmurs, no rubs, no clicks Abd:  soft, positive bowel sounds, no organomegally, no rebound, no guarding Ext:  2 plus pulses, no edema, no cyanosis, no clubbing Skin:  No rashes no nodules Neuro:  CN II through XII intact, motor grossly intact  EKG - reviewed NSR  Assess/Plan: 1. Atrial flutter - he is maintaining NSR on  amiodarone. I suspect that long term use of amiodarone will be problematic with his lung disease. I offered him further reduction in the amiodarone to 100 mg daily and catheter ablation if he has more atrial flutter. Also, I offered him proceeding with catheter ablation and if he then goes onto develop atrial fib, dofetilide. 2. Coags - he will continue systemic anti-coagulation with eliquis. 3.  COPD - he is on 2.5 liters of oxygen with nasal cannula.   Carleene Overlie Jarious Lyon,MD

## 2020-08-22 NOTE — Patient Instructions (Signed)
Medication Instructions:  Your physician has recommended you make the following change in your medication:  DECREASE Amiodarone to 0.5 tablets once daily.   *If you need a refill on your cardiac medications before your next appointment, please call your pharmacy*   Lab Work: Noen If you have labs (blood work) drawn today and your tests are completely normal, you will receive your results only by: Marland Kitchen MyChart Message (if you have MyChart) OR . A paper copy in the mail If you have any lab test that is abnormal or we need to change your treatment, we will call you to review the results.   Testing/Procedures: Noen   Follow-Up: At [facility], you and your health needs are our priority.  As part of our continuing mission to provide you with exceptional heart care, we have created designated Provider Care Teams.  These Care Teams include your primary Cardiologist (physician) and Advanced Practice Providers (APPs -  Physician Assistants and Nurse Practitioners) who all work together to provide you with the care you need, when you need it.  We recommend signing up for the patient portal called "MyChart".  Sign up information is provided on this After Visit Summary.  MyChart is used to connect with patients for Virtual Visits (Telemedicine).  Patients are able to view lab/test results, encounter notes, upcoming appointments, etc.  Non-urgent messages can be sent to your provider as well.   To learn more about what you can do with MyChart, go to [email or web address].    Your next appointment:   3 month(s)  The format for your next appointment:   In Person  Provider:   Cristopher Peru, MD   Other Instructions

## 2020-09-03 ENCOUNTER — Telehealth: Payer: Self-pay | Admitting: Cardiology

## 2020-09-03 MED ORDER — FUROSEMIDE 20 MG PO TABS: 20.0000 mg | ORAL_TABLET | Freq: Every day | ORAL | 3 refills | Status: DC

## 2020-09-03 NOTE — Telephone Encounter (Signed)
Patient called stating that Dr. Harl Bowie told him to increase his Lasix if needed.  He did increase and has run of Lasix.   [employer]

## 2020-09-17 ENCOUNTER — Other Ambulatory Visit: Payer: Self-pay

## 2020-09-17 ENCOUNTER — Encounter: Payer: Self-pay | Admitting: Internal Medicine

## 2020-09-17 ENCOUNTER — Ambulatory Visit: Payer: Medicare Other | Admitting: Internal Medicine

## 2020-09-17 ENCOUNTER — Ambulatory Visit (HOSPITAL_COMMUNITY)
Admission: RE | Admit: 2020-09-17 | Discharge: 2020-09-17 | Disposition: A | Discharge status: Home / Self Care | Payer: Medicare Other | Source: Ambulatory Visit | Attending: Internal Medicine | Admitting: Internal Medicine

## 2020-09-17 DIAGNOSIS — J9611 Chronic respiratory failure with hypoxia: Secondary | ICD-10-CM | POA: Diagnosis not present

## 2020-09-17 DIAGNOSIS — J9612 Chronic respiratory failure with hypercapnia: Secondary | ICD-10-CM | POA: Diagnosis not present

## 2020-09-17 DIAGNOSIS — J449 Chronic obstructive pulmonary disease, unspecified: Secondary | ICD-10-CM

## 2020-09-17 NOTE — Patient Instructions (Addendum)
Plan A = Automatic = Always=   First thing in am symbicort 160 x 2 puffs followed by spiriva x 2 pffs and then 12 hours later repeat the symbicort 160 x 2   Work on inhaler technique:  relax and gently blow all the way out then take a nice smooth deep breath back in, triggering the inhaler at same time you start breathing in.  Hold for up to 5 seconds if you can. Blow symbicort  out thru nose. Rinse and gargle with water when done    Plan B = Backup (to supplement plan A, not to replace it) Only use your albuterol inhaler as a rescue medication to be used if you can't catch your breath by resting or doing a relaxed purse lip breathing pattern.  - The less you use it, the better it will work when you need it. - Ok to use the inhaler up to 2 puffs  every 4 hours if you must but call for appointment if use goes up over your usual need - Don't leave home without it !!  (think of it like the spare tire for your car)   Plan C = Crisis (instead of Plan B but only if Plan B stops working) - only use your albuterol nebulizer if you first try Plan B and it fails to help > ok to use the nebulizer up to every 4 hours but if start needing it regularly call for immediate appointment   Please remember to go to the  x-[place] department  @  [facility] for your tests - we will call you with the results when they are available      Please schedule a follow up visit in 6 months but call sooner if needed

## 2020-09-17 NOTE — Progress Notes (Signed)
David Stein, male    DOB: [birth year]   MRN:  [id number]   Brief patient profile:  40 yowm quit smoking 2017  with "bronchitis" hx as child and  never really got over it or had nl aerobic activity but played basketball in HS and started using some albuterol as needed in his 25s and then symbicort/spiriva maint since later 50s helps some and referred to pulmonary clinic 08/24/2019 by Dr  Harl Bowie at [facility]    History of Present Illness  08/24/2019  Pulmonary/ 1st office eval/Wendy Hoback s/p moderna vaccination for covid 19  Chief Complaint  Patient presents with   Pulmonary Consult    Referred by Dr. Carlyle Dolly. Pt states dx with COPD in 2009. He c/o SOB since then and has had a sore throat x 1 wk. Recently took zpack and this helped some.   Dyspnea:  50 ft on 4lpm pulse with sats in 90s Cough: none  Sleep: flat in bed one pillow SABA use: 1st thing then gets in power chair all day / only hfa, not neb  Never prechallenges or rechallenges  REC Plan A = Automatic = Always=    symbicort 160 x 2 puffs followed by spiriva x 2 pffs and then 12 hours later repeat the symbicort 160 x 2  Work on inhaler technique:   Plan B = Backup (to supplement plan A, not to replace it) Only use your albuterol inhaler as a rescue medication Plan C = Crisis (instead of Plan B but only if Plan B stops working) - only use your albuterol nebulizer if you first try Plan B and it fails to help  Try albuterol 15 min before an activity that you know would make you short of breath and see if it makes any difference and if makes none then don't take it after activity unless you can't catch your breath.  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 Patient wears 3 liters all the time. Sometimes has a non-productive cough.    Dyspnea:  mostly w/c dep now  Cough: sometimes feels strangled with thin liquids  Sleeping: bed is flat wedge pillow SABA use: first thing in am proair, no neb use  02: 3lpm 24 /h Covid status: x 2 vax  Lung cancer screening: n/a    No obvious day to day or daytime variability or assoc excess/ purulent sputum or mucus plugs or hemoptysis or cp or chest tightness, subjective wheeze or overt sinus or hb symptoms.   sleeping without nocturnal  or early am exacerbation  of respiratory  c/o's or need for noct saba. Also denies any obvious fluctuation of symptoms with weather or environmental changes or other aggravating or alleviating factors except as outlined above   No unusual exposure hx or h/o childhood pna/ asthma or knowledge of premature birth.  Current Allergies, Complete Past Medical History, Past Surgical History, Family History, and Social History were reviewed in [employer] record.  ROS  The following are not active complaints unless bolded Hoarseness, sore throat, dysphagia, dental problems, itching, sneezing,  nasal congestion or discharge of excess mucus or purulent secretions, ear ache,   fever, chills, sweats, unintended wt loss or wt gain, classically pleuritic or exertional cp,  orthopnea pnd or arm/hand swelling  or leg swelling, presyncope, palpitations, abdominal pain, anorexia, nausea, vomiting, diarrhea  or change in bowel habits or change in bladder habits, change in stools or change in urine, dysuria, hematuria,  rash, arthralgias, visual complaints, headache, numbness, weakness or ataxia or problems with walking or coordination,  change in mood or  memory.        Current Meds  Medication Sig    acetaminophen (TYLENOL) 500 MG tablet Take 500 mg by mouth every 6 (six) hours as needed.   albuterol (PROVENTIL) (2.5 MG/3ML) 0.083% nebulizer solution Take 2.5 mg by nebulization every 6 (six) hours as needed for wheezing or shortness of breath.   ALPRAZolam (XANAX) 0.5 MG tablet Take 0.5 mg by mouth every 8 (eight) hours as needed for anxiety.   ALPRAZOLAM PO Take 1 tablet by mouth as needed.   amiodarone (PACERONE) 200 MG tablet Take 0.5 tablets (100 mg total) by mouth daily.   antiseptic oral rinse (BIOTENE) LIQD 15 mLs by Mouth Rinse route as needed for dry mouth.   apixaban (ELIQUIS) 5 MG TABS tablet Take 1 tablet (5 mg total) by mouth 2 (two) times daily.   budesonide-formoterol (SYMBICORT) 160-4.5 MCG/ACT inhaler Inhale 2 puffs into the lungs 2 (two) times daily.   clotrimazole-betamethasone (LOTRISONE) cream Apply 1 application topically daily.   diltiazem (CARDIZEM CD) 240 MG 24 hr capsule TAKE (1) CAPSULE BY MOUTH EVERY DAY.   Elastic Bandages & Supports (M-4 KNEE HIGH STOCKINGS) MISC 1 each by Does not apply route as directed.   ferrous sulfate 325 (65 FE) MG tablet Take 325 mg by mouth daily with breakfast.   fluocinonide cream (LIDEX) 0.05 % Apply topically 3 (three) times daily as needed.   furosemide (LASIX) 20 MG tablet Take 1 tablet (20 mg total) by mouth daily. May take an additional tablet daily as needed   guaiFENesin (MUCINEX) 600 MG 12 hr tablet Take 1 tablet (600 mg total) by mouth 2 (two) times daily.   HYDROcodone-acetaminophen (NORCO) 10-325 MG tablet Take 1 tablet by mouth 4 (four) times daily as needed.   levothyroxine (SYNTHROID, LEVOTHROID) 50 MCG tablet Take 50 mcg by mouth daily before breakfast.   Multiple Vitamin (MULTIVITAMIN WITH MINERALS) TABS tablet Take 1 tablet by mouth daily.   PROAIR HFA 108 (90 Base) MCG/ACT inhaler Inhale 1 puff into the lungs every 6 (six) hours as needed for wheezing or shortness of breath.   rosuvastatin (CRESTOR) 10 MG tablet Take  10 mg by mouth daily.   Tiotropium Bromide Monohydrate (SPIRIVA RESPIMAT) 2.5 MCG/ACT AERS Inhale 2 puffs into the lungs daily.   traZODone (DESYREL) 50 MG tablet Take 50 mg by mouth at bedtime.   VITAMIN D PO Take 1 tablet by mouth daily.                   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     albuterol (PROVENTIL) (2.5 MG/3ML) 0.083% nebulizer solution Take 2.5 mg by nebulization every 6 (six) hours as needed for wheezing or shortness of breath.      ALPRAZOLAM PO Take 1 tablet by mouth as needed.     antiseptic oral rinse (BIOTENE) LIQD 15 mLs by Mouth Rinse route as needed for dry mouth.     apixaban (ELIQUIS) 5 MG TABS tablet Take 1 tablet (5 mg total) by mouth 2 (two) times daily. 60 tablet 0   budesonide-formoterol (SYMBICORT) 160-4.5 MCG/ACT inhaler Inhale 2 puffs into the lungs 2 (two) times daily.     diltiazem (CARDIZEM CD) 240 MG 24 hr capsule Take 1 capsule (240 mg total) by mouth daily. 90 capsule 1   ferrous sulfate 325 (65 FE) MG tablet Take 325 mg by mouth daily with breakfast.     furosemide (LASIX) 20 MG tablet TAKE 1 TABLET DAILY - MAY TAKE ADDITIONAL 20 MG AS NEEDED FOR SWELLING 90 tablet 1   guaiFENesin (MUCINEX) 600 MG 12 hr tablet Take 1 tablet (600 mg total) by mouth 2 (two) times daily.     levothyroxine (SYNTHROID, LEVOTHROID) 50 MCG tablet Take 50 mcg by mouth daily before breakfast.     Multiple Vitamin (MULTIVITAMIN WITH MINERALS) TABS tablet Take 1 tablet by mouth daily.     PROAIR HFA 108 (90 Base) MCG/ACT inhaler Inhale 1-2 puffs into the lungs every 6 (six) hours as needed for wheezing or shortness of breath.        spiriva smi 2 puffs each am      traZODone (DESYREL) 50 MG tablet Take 50 mg by mouth at bedtime.     VITAMIN D PO Take 1 tablet by mouth daily.     polyethylene glycol (MIRALAX / GLYCOLAX) packet Take 17 g by mouth daily.     senna (SENOKOT) 8.6 MG tablet Take 1 tablet by mouth 2 (two) times daily.        Objective:     Wt Readings from Last 3 Encounters:  09/17/20 204 lb (92.5 kg)  08/22/20 208 lb (94.3 kg)  08/02/20 204 lb (92.5 kg)      Vital signs reviewed  09/17/2020  - Note at rest 02 sats  98 % on 3lpm    General appearance:    w/c bound elderly wm nad     HEENT : pt wearing mask not removed for exam due to covid -19 concerns.    NECK :  without JVD/Nodes/TM/ nl carotid upstrokes bilaterally   LUNGS: no acc muscle use,  Mod barrel  contour chest wall with bilateral  Distant bs s audible wheeze and  without cough on insp or exp maneuvers and mod  Hyperresonant  to  percussion bilaterally     CV:  RRR  no s3 or murmur or increase in P2, and elastic hose with L > R pitting both LEs   ABD:  soft and nontender with pos mid insp Hoover's  in the supine position. No bruits or organomegaly appreciated, bowel sounds nl  MS:     ext warm without deformities, calf tenderness, cyanosis or clubbing No obvious joint restrictions   SKIN: warm and dry without lesions    NEURO:  alert, approp, nl sensorium with  no motor or cerebellar deficits apparent.    CXR PA and Lateral:   09/17/2020 :    I personally  reviewed images/ impression as follows:    Copd/ cm with scarring both bases no acute change           Assessment

## 2020-09-17 NOTE — Assessment & Plan Note (Signed)
02 dep since his 26s - [facility]03  12/27/2015  = 40  - [facility]03   07/13/20     = 36  See chronic resp failure with hypoxemia/ hypercarbia   Well compensated on 3lpm at rest, up to 4lpm walking

## 2020-09-17 NOTE — Assessment & Plan Note (Signed)
Quit smoking 2017 with hx of "bronchitis all his life" - 08/24/2019  After extensive coaching inhaler device,  effectiveness =    75% so continue symb/spriva smi  - 09/17/2020  After extensive coaching inhaler device,  effectiveness =    90%     Group D in terms of symptom/risk and laba/lama/ICS  therefore appropriate rx at this point >>>  symbicort and spiriva with approp saba: I spent extra time with pt today reviewing appropriate use of albuterol for prn use on exertion with the following points: 1) saba is for relief of sob that does not improve by walking a slower pace or resting but rather if the pt does not improve after trying this first. 2) If the pt is convinced, as many are, that saba helps recover from activity faster then it's easy to tell if this is the case by re-challenging : ie stop, take the inhaler, then p 5 minutes try the exact same activity (intensity of workload) that just caused the symptoms and see if they are substantially diminished or not after saba 3) if there is an activity that reproducibly causes the symptoms, try the saba 15 min before the activity on alternate days   If in fact the saba really does help, then fine to continue to use it prn but advised may need to look closer at the maintenance regimen being used to achieve better control of airways disease with exertion.  F/u q 6 m, sooner prn         Each maintenance medication was reviewed in detail including emphasizing most importantly the difference between maintenance and prns and under what circumstances the prns are to be triggered using an action plan format where appropriate.  Total time for H and P, chart review, counseling, reviewing hfa/ 02 device(s) and generating customized AVS unique to this office visit / same day charting  > 30 min

## 2020-09-18 NOTE — Progress Notes (Signed)
LMTCB for pt 

## 2020-09-19 ENCOUNTER — Telehealth: Payer: Self-pay | Admitting: Internal Medicine

## 2020-09-19 DIAGNOSIS — J8410 Pulmonary fibrosis, unspecified: Secondary | ICD-10-CM

## 2020-09-19 NOTE — Telephone Encounter (Signed)
Tanda Rockers, MD  09/18/2020 10:36 AM EDT      Call patient :  Study is worrisome for amipdarone side effects   Needs ESR/BNP  and HRCT chest  in next week or two to sort out    I called and spoke with the pt and notified of results. He verbalized understanding and was agreeable to the labs and HRCT. Orders placed.

## 2020-09-19 NOTE — Progress Notes (Signed)
Spoke with pt and notified of results per Dr. Melvyn Novas. Pt verbalized understanding and denied any questions. See phone note dated 09/19/20

## 2020-09-26 ENCOUNTER — Ambulatory Visit (HOSPITAL_COMMUNITY)
Admission: RE | Admit: 2020-09-26 | Discharge: 2020-09-26 | Disposition: A | Discharge status: Home / Self Care | Payer: Medicare Other | Source: Ambulatory Visit | Attending: Internal Medicine | Admitting: Internal Medicine

## 2020-09-26 ENCOUNTER — Other Ambulatory Visit: Payer: Self-pay

## 2020-09-26 ENCOUNTER — Other Ambulatory Visit (HOSPITAL_COMMUNITY)
Admission: RE | Admit: 2020-09-26 | Discharge: 2020-09-26 | Disposition: A | Discharge status: Home / Self Care | Payer: Medicare Other | Source: Ambulatory Visit | Attending: Internal Medicine | Admitting: Internal Medicine

## 2020-09-26 DIAGNOSIS — J8410 Pulmonary fibrosis, unspecified: Secondary | ICD-10-CM | POA: Insufficient documentation

## 2020-09-26 DIAGNOSIS — I700 Atherosclerosis of aorta: Secondary | ICD-10-CM | POA: Insufficient documentation

## 2020-09-26 DIAGNOSIS — I2510 Atherosclerotic heart disease of native coronary artery without angina pectoris: Secondary | ICD-10-CM | POA: Diagnosis not present

## 2020-09-26 DIAGNOSIS — N200 Calculus of kidney: Secondary | ICD-10-CM | POA: Diagnosis not present

## 2020-09-26 DIAGNOSIS — J432 Centrilobular emphysema: Secondary | ICD-10-CM | POA: Diagnosis not present

## 2020-09-26 LAB — SEDIMENTATION RATE: Sed Rate: 50 mm/hr — ABNORMAL HIGH (ref 0–16)

## 2020-09-26 LAB — BRAIN NATRIURETIC PEPTIDE: B Natriuretic Peptide: 45 pg/mL (ref 0.0–100.0)

## 2020-09-27 NOTE — Progress Notes (Signed)
Spoke with pt and notified of results per Dr. Wert. Pt verbalized understanding and denied any questions. 

## 2020-10-12 ENCOUNTER — Other Ambulatory Visit: Payer: Self-pay | Admitting: Internal Medicine

## 2020-10-12 ENCOUNTER — Other Ambulatory Visit: Payer: Self-pay | Admitting: Cardiology

## 2020-10-16 ENCOUNTER — Telehealth: Payer: Self-pay | Admitting: Internal Medicine

## 2020-10-16 MED ORDER — AZITHROMYCIN 250 MG PO TABS: ORAL_TABLET | ORAL | 0 refills | Status: DC

## 2020-10-16 MED ORDER — PREDNISONE 10 MG PO TABS: ORAL_TABLET | ORAL | 0 refills | Status: AC

## 2020-10-16 NOTE — Telephone Encounter (Signed)
Prednisone 10 mg take  4 each am x 2 days,   2 each am x 2 days,  1 each am x 2 days and stop   Zpak  Ov with all inhalers next available

## 2020-10-16 NOTE — Telephone Encounter (Signed)
Called and spoke with pt letting him know the recs per MW and he verbalized understanding. Verified preferred pharmacy and sent meds in for pt. Nothing further needed.

## 2020-10-16 NOTE — Telephone Encounter (Signed)
Called and spoke with pt who states he began to have symptoms including wheezing, cough with clear phlegm, chest congestion which began 2 days ago.  Pt denies any problems with his breathing.  States that he is using his spiriva and symbicort inhalers as prescribed. Pt has had to use his nebulizer twice since symptoms began. States that he has not had to use his rescue inhaler.  Pt is also taking mucinex '600mg'$  and also all the other meds he is prescribed.  Pt is requesting to have a refill of the zpak prior to symptoms getting worse and also wants to know if there is anything else that we can recommend.  Dr. Melvyn Novas, please advise.

## 2020-10-31 ENCOUNTER — Ambulatory Visit: Payer: Medicare Other | Admitting: Internal Medicine

## 2020-10-31 ENCOUNTER — Other Ambulatory Visit: Payer: Self-pay

## 2020-10-31 ENCOUNTER — Encounter: Payer: Self-pay | Admitting: Internal Medicine

## 2020-10-31 DIAGNOSIS — J9612 Chronic respiratory failure with hypercapnia: Secondary | ICD-10-CM

## 2020-10-31 DIAGNOSIS — J9611 Chronic respiratory failure with hypoxia: Secondary | ICD-10-CM | POA: Diagnosis not present

## 2020-10-31 DIAGNOSIS — J449 Chronic obstructive pulmonary disease, unspecified: Secondary | ICD-10-CM | POA: Diagnosis not present

## 2020-10-31 DIAGNOSIS — A310 Pulmonary mycobacterial infection: Secondary | ICD-10-CM | POA: Insufficient documentation

## 2020-10-31 MED ORDER — ALBUTEROL SULFATE (2.5 MG/3ML) 0.083% IN NEBU: 2.5000 mg | INHALATION_SOLUTION | RESPIRATORY_TRACT | 11 refills | Status: DC | PRN

## 2020-10-31 NOTE — Assessment & Plan Note (Addendum)
Quit smoking 2017 with hx of "bronchitis all his life" - 08/24/2019  After extensive coaching inhaler device,  effectiveness =    75% so continue symb/spriva smi  - uses prednisone prn since 2017  - 10/31/2020  After extensive coaching inhaler device,  effectiveness =    90%    Group D in terms of symptom/risk and laba/lama/ICS  therefore appropriate rx at this point >>>  Spiriva/symbicort and prn saba  Re saba: I spent extra time with pt today reviewing appropriate use of albuterol for prn use on exertion with the following points: 1) saba is for relief of sob that does not improve by walking a slower pace or resting but rather if the pt does not improve after trying this first. 2) If the pt is convinced, as many are, that saba helps recover from activity faster then it's easy to tell if this is the case by re-challenging : ie stop, take the inhaler, then p 5 minutes try the exact same activity (intensity of workload) that just caused the symptoms and see if they are substantially diminished or not after saba 3) if there is an activity that reproducibly causes the symptoms, try the saba 15 min before the activity on alternate days   If in fact the saba really does help, then fine to continue to use it prn but advised may need to look closer at the maintenance regimen being used to achieve better control of airways disease with exertion.    re pred: The goal with a chronic steroid dependent illness is always arriving at the lowest effective dose that controls the disease/symptoms and not accepting a set "formula" which is based on statistics or guidelines that don't always take into account patient  variability or the natural hx of the dz in every individual patient, which may well vary over time.  For now therefore I recommend the patient maintain  Off but if declining and neb use goes up ok to rx as he has for the past 5 y but bring the bottle back at next ov so we can keep up with it.

## 2020-10-31 NOTE — Patient Instructions (Signed)
No change in your respiratory medications   Make sure you check your oxygen saturation  at your highest level of activity  to be sure it stays over 90% and adjust  02 flow upward to maintain this level if needed but remember to turn it back to previous settings when you stop (to conserve your supply).   Please schedule a follow up visit in 3 months but call sooner if needed with CxR on return

## 2020-10-31 NOTE — Assessment & Plan Note (Signed)
02 dep since his 21s - [facility]  12/27/2015  = 40  - [facility]03   07/13/20     = 36  See chronic resp failure with hypoxemia/ hypercarbia  - HRCT  09/26/20 1. 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 2. 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

## 2020-10-31 NOTE — Assessment & Plan Note (Signed)
See HRCT  10/12/20  This is an extremely common benign condition in the elderly and does not warrant aggressive eval/ rx at this point unless there is a clinical correlation suggesting unaddressed pulmonary infection (purulent sputum, night sweats, unintended wt loss, doe) or evolution of  obvious changes on plain cxr (as opposed to serial CT, which is way over sensitive to make clinical decisions re intervention and treatment in the elderly, who tend to tolerate both dx and treatment poorly) .   F/u cxr in 3 m, call sooner if new NS, cough, fever  Discussed in detail all the  indications, usual  risks and alternatives  relative to the benefits with patient who agrees to proceed with conservative f/u as outlined    Each maintenance medication was reviewed in detail including emphasizing most importantly the difference between maintenance and prns and under what circumstances the prns are to be triggered using an action plan format where appropriate.  Total time for H and P, chart review, counseling, reviewing hfa device(s) , directly observing portions of ambulatory 02 saturation study/ and generating customized AVS unique to this office visit / same day charting  > 30 min

## 2020-10-31 NOTE — Progress Notes (Signed)
David Stein, male    DOB: 01-Aug-1945   MRN:  [id number]   Brief patient profile:  71 yowm quit smoking 2017  with "bronchitis" hx as child and  never really got over it or had nl aerobic activity but played basketball in HS and started using some albuterol as needed in his 18s and then symbicort/spiriva maint since later 41s helps some and referred to pulmonary clinic 08/24/2019 by Dr  Harl Bowie at [facility]    History of Present Illness  08/24/2019  Pulmonary/ 1st office eval/Erbie Arment s/p moderna vaccination for covid 19  Chief Complaint  Patient presents with   Pulmonary Consult    Referred by Dr. Carlyle Dolly. Pt states dx with COPD in 2009. He c/o SOB since then and has had a sore throat x 1 wk. Recently took zpack and this helped some.   Dyspnea:  50 ft on 4lpm pulse with sats in 90s Cough: none  Sleep: flat in bed one pillow SABA use: 1st thing then gets in power chair all day / only hfa, not neb  Never prechallenges or rechallenges  REC Plan A = Automatic = Always=    symbicort 160 x 2 puffs followed by spiriva x 2 pffs and then 12 hours later repeat the symbicort 160 x 2  Work on inhaler technique:   Plan B = Backup (to supplement plan A, not to replace it) Only use your albuterol inhaler as a rescue medication Plan C = Crisis (instead of Plan B but only if Plan B stops working) - only use your albuterol nebulizer if you first try Plan B and it fails to help  Try albuterol 15 min before an activity that you know would make you short of breath and see if it makes any difference and if makes none then don't take it after activity unless you can't catch your breath.  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 Patient wears 3 liters all the time. Sometimes has a non-productive cough.    Dyspnea:  mostly w/c dep now  Cough: sometimes feels strangled with thin liquids  Sleeping: bed is flat wedge pillow SABA use: first thing in am proair, no neb use  02: 3lpm 24 /h Covid status: x 2 vax  Lung cancer screening: n/a  Rec Plan A = Automatic = Always=   First thing in am symbicort 160 x 2 puffs followed by spiriva x 2 pffs and then 12 hours later repeat the symbicort 160 x 2  Work on inhaler technique:   Plan B = Backup (to supplement plan A, not to replace it) Only use your albuterol inhaler as a rescue medication Plan C = Crisis (instead of Plan B but only if Plan B stops working) - only use your albuterol nebulizer if you first try Plan B and it fails to help    10/31/2020  f/u ov/ office/Raquelle Pietro re: Group D copd maint on symb/spiriva / 3lpm  Chief Complaint  Patient  presents with   Follow-up    Pt c/o increased SOB just since this morning. No coughing or wheezing. He took a dose of prednisone and states this helped. He says keeps pred on hand if needed. He is using his proair 1-2 x per day on average and rarely uses neb.   Dyspnea:  across the room = 25 flt  Cough: none  Sleeping: flat /wedge pillows SABA use: as above, not using neb  02: 3lpm sometimes 4lpm but not really titrating  Covid status: vax x 3      No obvious day to day or daytime variability or assoc excess/ purulent sputum or mucus plugs or hemoptysis or cp or chest tightness, subjective wheeze or overt sinus or hb symptoms.   Sleeping as above without nocturnal  or early am exacerbation  of respiratory  c/o's or need for noct saba. Also denies any obvious fluctuation of symptoms with weather or environmental changes or other aggravating or alleviating  factors except as outlined above   No unusual exposure hx or h/o childhood pna/ asthma or knowledge of premature birth.  Current Allergies, Complete Past Medical History, Past Surgical History, Family History, and Social History were reviewed in [employer] record.  ROS  The following are not active complaints unless bolded Hoarseness, sore throat, dysphagia, dental problems, itching, sneezing,  nasal congestion or discharge of excess mucus or purulent secretions, ear ache,   fever, chills, sweats, unintended wt loss or wt gain, classically pleuritic or exertional cp,  orthopnea pnd or arm/hand swelling  or leg swelling, presyncope, palpitations, abdominal pain, anorexia, nausea, vomiting, diarrhea  or change in bowel habits or change in bladder habits, change in stools or change in urine, dysuria, hematuria,  rash, arthralgias, visual complaints, headache, numbness, weakness or ataxia or problems with walking or coordination,  change in mood or  memory.        Current Meds  Medication Sig   acetaminophen (TYLENOL) 500 MG tablet Take 500 mg by mouth every 6 (six) hours as needed.   albuterol (PROVENTIL) (2.5 MG/3ML) 0.083% nebulizer solution Take 2.5 mg by nebulization every 6 (six) hours as needed for wheezing or shortness of breath.   ALPRAZolam (XANAX) 0.5 MG tablet Take 0.5 mg by mouth every 8 (eight) hours as needed for anxiety.   ALPRAZOLAM PO Take 1 tablet by mouth as needed.   amiodarone (PACERONE) 200 MG tablet Take 0.5 tablets (100 mg total) by mouth daily.   antiseptic oral rinse (BIOTENE) LIQD 15 mLs by Mouth Rinse route as needed for dry mouth.   apixaban (ELIQUIS) 5 MG TABS tablet Take 1 tablet (5 mg total) by mouth 2 (two) times daily.   budesonide-formoterol (SYMBICORT) 160-4.5 MCG/ACT inhaler Inhale 2 puffs into the lungs 2 (two) times daily.   clotrimazole-betamethasone (LOTRISONE) cream Apply 1 application topically daily.   diltiazem (CARDIZEM CD) 240  MG 24 hr capsule TAKE (1) CAPSULE BY MOUTH EVERY DAY.   Elastic Bandages & Supports (M-4 KNEE HIGH STOCKINGS) MISC 1 each by Does not apply route as directed.   ferrous sulfate 325 (65 FE) MG tablet Take 325 mg by mouth daily with breakfast.   fluocinonide cream (LIDEX) 0.05 % Apply topically 3 (three) times daily as needed.   furosemide (LASIX) 20 MG tablet Take 1 tablet (20 mg total) by mouth daily. May take an additional tablet daily as needed   guaiFENesin (MUCINEX) 600 MG 12 hr tablet Take 1 tablet (600 mg total) by  mouth 2 (two) times daily.   HYDROcodone-acetaminophen (NORCO) 10-325 MG tablet Take 1 tablet by mouth 4 (four) times daily as needed.   levothyroxine (SYNTHROID, LEVOTHROID) 50 MCG tablet Take 50 mcg by mouth daily before breakfast.   Multiple Vitamin (MULTIVITAMIN WITH MINERALS) TABS tablet Take 1 tablet by mouth daily.   PROAIR HFA 108 (90 Base) MCG/ACT inhaler Inhale 1 puff into the lungs every 6 (six) hours as needed for wheezing or shortness of breath.   rosuvastatin (CRESTOR) 10 MG tablet Take 10 mg by mouth daily.   Tiotropium Bromide Monohydrate (SPIRIVA RESPIMAT) 2.5 MCG/ACT AERS Inhale 2 puffs into the lungs daily.   traZODone (DESYREL) 50 MG tablet Take 50 mg by mouth at bedtime.   VITAMIN D PO Take 1 tablet by mouth daily.                     Past Medical History:  Diagnosis Date   Arthritis    COPD (chronic obstructive pulmonary disease) ([place])    Kidney stones    Oxygen dependent    2.5L   Restless leg syndrome    Shortness of breath dyspnea         Objective:      10/31/2020      214    09/17/20 204 lb (92.5 kg)  08/22/20 208 lb (94.3 kg)  08/02/20 204 lb (92.5 kg)    Vital signs reviewed  10/31/2020  - Note at rest 02 sats  93% on 3lpm    General appearance:    chronically ill elderly wm nad    HEENT : pt wearing mask not removed for exam due to covid -19 concerns.    NECK :  without JVD/Nodes/TM/ nl carotid upstrokes  bilaterally   LUNGS: no acc muscle use,  Mod barrel  contour chest wall with bilateral  Distant bs s audible wheeze and  without cough on insp or exp maneuvers and mod  Hyperresonant  to  percussion bilaterally     CV:  RRR  no s3 or murmur or increase in P2, and elastic hose/ trace to 1+ pitting both LE's   ABD:  soft and nontender with pos mid insp Hoover's  in the supine position. No bruits or organomegaly appreciated, bowel sounds nl  MS:     ext warm without deformities, calf tenderness, cyanosis or clubbing No obvious joint restrictions   SKIN: warm and dry without lesions    NEURO:  alert, approp, nl sensorium with  no motor or cerebellar deficits apparent.          I personally reviewed images and agree with radiology impression as follows:   Chest HRCT  10/12/20 1. 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 2. 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

## 2020-11-28 ENCOUNTER — Ambulatory Visit: Payer: Medicare Other | Admitting: Internal Medicine

## 2020-11-28 ENCOUNTER — Encounter: Payer: Self-pay | Admitting: Internal Medicine

## 2020-11-28 ENCOUNTER — Other Ambulatory Visit: Payer: Self-pay

## 2020-11-28 VITALS — BP 122/60 | HR 71 | Ht 74.0 in | Wt 211.0 lb

## 2020-11-28 DIAGNOSIS — I5082 Biventricular heart failure: Secondary | ICD-10-CM

## 2020-11-28 DIAGNOSIS — I5020 Unspecified systolic (congestive) heart failure: Secondary | ICD-10-CM | POA: Diagnosis not present

## 2020-11-28 DIAGNOSIS — I4892 Unspecified atrial flutter: Secondary | ICD-10-CM

## 2020-11-28 DIAGNOSIS — J449 Chronic obstructive pulmonary disease, unspecified: Secondary | ICD-10-CM

## 2020-11-28 NOTE — Patient Instructions (Signed)
Medication Instructions:  Your physician recommends that you continue on your current medications as directed. Please refer to the Current Medication list given to you today.  *If you need a refill on your cardiac medications before your next appointment, please call your pharmacy*   Lab Work: NONE   If you have labs (blood work) drawn today and your tests are completely normal, you will receive your results only by: MyChart Message (if you have MyChart) OR A paper copy in the mail If you have any lab test that is abnormal or we need to change your treatment, we will call you to review the results.   Testing/Procedures: NONE    Follow-Up: At [facility], you and your health needs are our priority.  As part of our continuing mission to provide you with exceptional heart care, we have created designated Provider Care Teams.  These Care Teams include your primary Cardiologist (physician) and Advanced Practice Providers (APPs -  Physician Assistants and Nurse Practitioners) who all work together to provide you with the care you need, when you need it.  We recommend signing up for the patient portal called "MyChart".  Sign up information is provided on this After Visit Summary.  MyChart is used to connect with patients for Virtual Visits (Telemedicine).  Patients are able to view lab/test results, encounter notes, upcoming appointments, etc.  Non-urgent messages can be sent to your provider as well.   To learn more about what you can do with MyChart, go to [email or web address].    Your next appointment:   6 month(s)  The format for your next appointment:   In Person  Provider:   Gregg Taylor, MD   Other Instructions Thank you for choosing [facility]!    

## 2020-11-28 NOTE — Progress Notes (Signed)
HPI David Stein returns today for followup. He is a pleasant 75 yo man with oxygen dependent COPD and initial atrial flutter back in 2017. At that time he was placed on amiodarone and was kept on the amiodarone for about a year. He was stopped and did well until earlier this year when he developed recurrent atrial flutter. He was placed back on amiodarone 200 mg daily and is now maintaining NSR on 100 mg daily.  He has not had more in the way of symptomatic atrial flutter. He denies a cough and his chronic dyspnea has not worsened. The patient denies syncope. No cough.  Allergies  Allergen Reactions   Codeine Other (See Comments)    headache     Current Outpatient Medications  Medication Sig Dispense Refill   acetaminophen (TYLENOL) 500 MG tablet Take 500 mg by mouth every 6 (six) hours as needed.     albuterol (PROVENTIL) (2.5 MG/3ML) 0.083% nebulizer solution Take 3 mLs (2.5 mg total) by nebulization every 4 (four) hours as needed for wheezing or shortness of breath. 75 mL 11   ALPRAZolam (XANAX) 0.5 MG tablet Take 0.5 mg by mouth every 8 (eight) hours as needed for anxiety.     ALPRAZOLAM PO Take 1 tablet by mouth as needed.     amiodarone (PACERONE) 200 MG tablet Take 0.5 tablets (100 mg total) by mouth daily. 45 tablet 3   antiseptic oral rinse (BIOTENE) LIQD 15 mLs by Mouth Rinse route as needed for dry mouth.     apixaban (ELIQUIS) 5 MG TABS tablet Take 1 tablet (5 mg total) by mouth 2 (two) times daily. 60 tablet 0   budesonide-formoterol (SYMBICORT) 160-4.5 MCG/ACT inhaler Inhale 2 puffs into the lungs 2 (two) times daily.     clotrimazole-betamethasone (LOTRISONE) cream Apply 1 application topically daily.     diltiazem (CARDIZEM CD) 240 MG 24 hr capsule TAKE (1) CAPSULE BY MOUTH EVERY DAY. 90 capsule 1   Elastic Bandages & Supports (M-4 KNEE HIGH STOCKINGS) MISC 1 each by Does not apply route as directed. 1 each 0   ferrous sulfate 325 (65 FE) MG tablet Take 325 mg by mouth  daily with breakfast.     fluocinonide cream (LIDEX) 0.05 % Apply topically 3 (three) times daily as needed.     furosemide (LASIX) 20 MG tablet Take 1 tablet (20 mg total) by mouth daily. May take an additional tablet daily as needed 180 tablet 3   guaiFENesin (MUCINEX) 600 MG 12 hr tablet Take 1 tablet (600 mg total) by mouth 2 (two) times daily.     HYDROcodone-acetaminophen (NORCO) 10-325 MG tablet Take 1 tablet by mouth 4 (four) times daily as needed.     levothyroxine (SYNTHROID, LEVOTHROID) 50 MCG tablet Take 50 mcg by mouth daily before breakfast.     Multiple Vitamin (MULTIVITAMIN WITH MINERALS) TABS tablet Take 1 tablet by mouth daily.     PROAIR HFA 108 (90 Base) MCG/ACT inhaler Inhale 1 puff into the lungs every 6 (six) hours as needed for wheezing or shortness of breath.     rosuvastatin (CRESTOR) 10 MG tablet Take 10 mg by mouth daily.     Tiotropium Bromide Monohydrate (SPIRIVA RESPIMAT) 2.5 MCG/ACT AERS Inhale 2 puffs into the lungs daily. 4 g 0   traZODone (DESYREL) 50 MG tablet Take 50 mg by mouth at bedtime.     VITAMIN D PO Take 1 tablet by mouth daily.     No current  facility-administered medications for this visit.     Past Medical History:  Diagnosis Date   Arthritis    Atrial flutter ([facility])    COPD (chronic obstructive pulmonary disease) ([facility])    Kidney stones    Oxygen dependent    2.5L   Restless leg syndrome    Shortness of breath dyspnea     ROS:   All systems reviewed and negative except as noted in the HPI.   Past Surgical History:  Procedure Laterality Date   CATARACT EXTRACTION W/PHACO Right 01/31/2014   Procedure: CATARACT EXTRACTION PHACO AND INTRAOCULAR LENS PLACEMENT RIGHT EYE CDE=6.90;  Surgeon: Elta Guadeloupe T. Gershon Crane, MD;  Location: [facility];  Service: Ophthalmology;  Laterality: Right;   CATARACT EXTRACTION W/PHACO Left 02/14/2014   Procedure: CATARACT EXTRACTION PHACO AND INTRAOCULAR LENS PLACEMENT; CDE:  8.34;  Surgeon: Elta Guadeloupe T. Gershon Crane, MD;   Location: [facility];  Service: Ophthalmology;  Laterality: Left;   KNEE SURGERY Left      Family History  Problem Relation Age of Onset   Rheumatic fever Father    Alzheimer's disease Mother    Stroke Sister      Social History   Socioeconomic History   Marital status: Married    Spouse name: Not on file   Number of children: Not on file   Years of education: Not on file   Highest education level: Not on file  Occupational History   Not on file  Tobacco Use   Smoking status: Former    Packs/day: 0.25    Years: 55.00    Pack years: 13.75    Types: Cigarettes    Quit date: 12/12/2015    Years since quitting: 4.9   Smokeless tobacco: Never  Vaping Use   Vaping Use: Never used  Substance and Sexual Activity   Alcohol use: No   Drug use: No   Sexual activity: Not Currently  Other Topics Concern   Not on file  Social History Narrative   Not on file   Social Determinants of Health   Financial Resource Strain: Not on file  Food Insecurity: Not on file  Transportation Needs: Not on file  Physical Activity: Not on file  Stress: Not on file  Social Connections: Not on file  Intimate Partner Violence: Not on file     BP 122/60   Pulse 71   Ht '6\' 2"'$  (1.88 m)   Wt 211 lb (95.7 kg)   SpO2 94%   BMI 27.09 kg/m   Physical Exam:  Well appearing NAD HEENT: Unremarkable Neck:  No JVD, no thyromegally Lymphatics:  No adenopathy Back:  No CVA tenderness Lungs:  Clear HEART:  Regular rate rhythm, no murmurs, no rubs, no clicks Abd:  soft, positive bowel sounds, no organomegally, no rebound, no guarding Ext:  2 plus pulses, no edema, no cyanosis, no clubbing Skin:  No rashes no nodules Neuro:  CN II through XII intact, motor grossly intact  EKG  DEVICE  Normal device function.  See PaceArt for details.   Assess/Plan:  1. Atrial flutter - he is maintaining NSR on amiodarone. He appears to be tolerating low dose amio and is maintaining NSR.  2. Coags - he will  continue systemic anti-coagulation with eliquis. 3. COPD - he is on 2.5 liters of oxygen with nasal cannula.  4. Chronic diastolic heart failure - his symptoms are well compensated. We will follow and he will avoid salty foods.   David Stein Tammye Kahler,MD

## 2020-12-19 ENCOUNTER — Encounter: Payer: Self-pay | Admitting: Cardiology

## 2020-12-19 ENCOUNTER — Ambulatory Visit: Payer: Medicare Other | Admitting: Cardiology

## 2020-12-19 VITALS — BP 132/56 | HR 67 | Ht 74.0 in | Wt 211.6 lb

## 2020-12-19 DIAGNOSIS — I50812 Chronic right heart failure: Secondary | ICD-10-CM

## 2020-12-19 DIAGNOSIS — I4892 Unspecified atrial flutter: Secondary | ICD-10-CM | POA: Diagnosis not present

## 2020-12-19 NOTE — Patient Instructions (Signed)

## 2020-12-19 NOTE — Progress Notes (Signed)
Clinical Summary David Stein is a 75 y.o.male seen today for follow up of the following medical problems.    1. Aflutter - we previously stopped amio as he was doing well, initially started during admission in 2017 with pneumonia where his aflutter was difficult to control       - admitted 06/2020 with palpitations, found to be in aflutter - paroxysmal aflutter during admission, DCCV not pursued - started on oral amio during that admission   - no recent palpitations - compliant with meds. No bleeding on eliquis.     2. COPD - followed by Dr Melvyn Novas, on home O2 3L    3. 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  - wears compression stocking - takes lasix 20mg  daily, will take additional if needed.   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     albuterol (PROVENTIL) (2.5 MG/3ML) 0.083% nebulizer solution Take 3 mLs (2.5 mg total) by nebulization every 4 (four) hours as needed for wheezing or shortness of breath. 75 mL 11   ALPRAZolam (XANAX) 0.5 MG tablet Take 0.5 mg by mouth every 8 (eight) hours as needed for anxiety.     ALPRAZOLAM PO Take 1 tablet by mouth as needed.     amiodarone (PACERONE) 200 MG tablet Take 0.5 tablets (100 mg total) by mouth daily. 45 tablet 3   antiseptic oral rinse (BIOTENE) LIQD 15 mLs by Mouth Rinse route as needed for dry mouth.     apixaban (ELIQUIS) 5 MG TABS tablet Take 1 tablet (5 mg total) by mouth  2 (two) times daily. 60 tablet 0   budesonide-formoterol (SYMBICORT) 160-4.5 MCG/ACT inhaler Inhale 2 puffs into the lungs 2 (two) times daily.     clotrimazole-betamethasone (LOTRISONE) cream Apply 1 application topically daily.     diltiazem (CARDIZEM CD) 240 MG 24 hr capsule TAKE (1) CAPSULE BY MOUTH EVERY DAY. 90 capsule 1   Elastic Bandages & Supports (M-4 KNEE HIGH STOCKINGS) MISC 1 each by Does not apply route as directed. 1 each 0   ferrous sulfate 325 (65 FE) MG tablet Take 325 mg by mouth daily with breakfast.     fluocinonide cream (LIDEX) 0.05 % Apply topically 3 (three) times daily as needed.     furosemide (LASIX) 20 MG tablet Take 1 tablet (20 mg total) by mouth daily. May take an additional tablet daily as needed 180 tablet 3   guaiFENesin (MUCINEX) 600 MG 12 hr tablet Take 1 tablet (600 mg total) by mouth 2 (two) times daily.     HYDROcodone-acetaminophen (NORCO) 10-325 MG tablet Take 1 tablet by mouth 4 (four) times daily as needed.     levothyroxine (SYNTHROID, LEVOTHROID) 50 MCG tablet Take 50 mcg by mouth daily before breakfast.     Multiple Vitamin (MULTIVITAMIN WITH MINERALS)  TABS tablet Take 1 tablet by mouth daily.     PROAIR HFA 108 (90 Base) MCG/ACT inhaler Inhale 1 puff into the lungs every 6 (six) hours as needed for wheezing or shortness of breath.     rosuvastatin (CRESTOR) 10 MG tablet Take 10 mg by mouth daily.     Tiotropium Bromide Monohydrate (SPIRIVA RESPIMAT) 2.5 MCG/ACT AERS Inhale 2 puffs into the lungs daily. 4 g 0   traZODone (DESYREL) 50 MG tablet Take 50 mg by mouth at bedtime.     VITAMIN D PO Take 1 tablet by mouth daily.     No current facility-administered medications for this visit.     Past Surgical History:  Procedure Laterality Date   CATARACT EXTRACTION W/PHACO Right 01/31/2014   Procedure: CATARACT EXTRACTION PHACO AND INTRAOCULAR LENS PLACEMENT RIGHT EYE CDE=6.90;  Surgeon: Elta Guadeloupe T. Gershon Crane, MD;  Location: [facility];  Service: Ophthalmology;   Laterality: Right;   CATARACT EXTRACTION W/PHACO Left 02/14/2014   Procedure: CATARACT EXTRACTION PHACO AND INTRAOCULAR LENS PLACEMENT; CDE:  8.34;  Surgeon: Elta Guadeloupe T. Gershon Crane, MD;  Location: [facility];  Service: Ophthalmology;  Laterality: Left;   KNEE SURGERY Left      Allergies  Allergen Reactions   Codeine Other (See Comments)    headache      Family History  Problem Relation Age of Onset   Rheumatic fever Father    Alzheimer's disease Mother    Stroke Sister      Social History David Stein reports that he quit smoking about 5 years ago. His smoking use included cigarettes. He has a 13.75 pack-year smoking history. He has never used smokeless tobacco. David Stein reports no history of alcohol use.   Review of Systems CONSTITUTIONAL: No weight loss, fever, chills, weakness or fatigue.  HEENT: Eyes: No visual loss, blurred vision, double vision or yellow sclerae.No hearing loss, sneezing, congestion, runny nose or sore throat.  SKIN: No rash or itching.  CARDIOVASCULAR: per hpi RESPIRATORY: No shortness of breath, cough or sputum.  GASTROINTESTINAL: No anorexia, nausea, vomiting or diarrhea. No abdominal pain or blood.  GENITOURINARY: No burning on urination, no polyuria NEUROLOGICAL: No headache, dizziness, syncope, paralysis, ataxia, numbness or tingling in the extremities. No change in bowel or bladder control.  MUSCULOSKELETAL: No muscle, back pain, joint pain or stiffness.  LYMPHATICS: No enlarged nodes. No history of splenectomy.  PSYCHIATRIC: No history of depression or anxiety.  ENDOCRINOLOGIC: No reports of sweating, cold or heat intolerance. No polyuria or polydipsia.  Marland Kitchen   Physical Examination Today's Vitals   12/19/20 1417  BP: (!) 132/56  Pulse: 67  SpO2: 92%  Weight: 211 lb 9.6 oz (96 kg)  Height: 6\' 2"  (1.88 m)   Body mass index is 27.17 kg/m.  Gen: resting comfortably, no acute distress HEENT: no scleral icterus, pupils equal round and reactive,  no palptable cervical adenopathy,  CV: RRR, no m/r/g no jvd Resp: Clear to auscultation bilaterally GI: abdomen is soft, non-tender, non-distended, normal bowel sounds, no hepatosplenomegaly MSK: extremities are warm, no edema.  Skin: warm, no rash Neuro:  no focal deficits Psych: appropriate affect   Diagnostic Studies  11/2015 echo Study Conclusions   - Left ventricle: The cavity size was normal. Wall thickness was   normal. Systolic function was normal. The estimated ejection   fraction was in the range of 55% to 60%. Although no diagnostic   regional wall motion abnormality was identified, this possibility   cannot be completely excluded on the  basis of this study. - Ventricular septum: Septal motion showed abnormal function,   dyssynergy, and paradox. The contour showed systolic flattening.   These changes are consistent with RV pressure overload.   Impressions:   - The systolic PA pressure could not be estimated due to the   absence of TR jet. The 2D findings are suspicious for severely   elevated PA pressure.     06/2020 echo IMPRESSIONS     1. Left ventricular ejection fraction, by estimation, is approximately 55  to 60%. The left ventricle has normal function. Left ventricular  endocardial border not optimally defined to evaluate regional wall motion.  There is mild left ventricular  hypertrophy. Left ventricular diastolic parameters are consistent with  Grade I diastolic dysfunction (impaired relaxation).   2. Right ventricular systolic function is severely reduced. The right  ventricular size is moderately enlarged. Tricuspid regurgitation signal is  inadequate for assessing PA pressure.   3. The mitral valve is grossly normal. Trivial mitral valve  regurgitation.   4. The aortic valve is tricuspid. Aortic valve regurgitation is not  visualized.   5. The inferior vena cava is dilated in size with >50% respiratory  variability, suggesting right atrial pressure  of 8 mmHg.      Assessment and Plan  1. Aflutter, paroxysmal atypical - symptoms much improved since starting amio during admission - continue to follow with EP, continue current meds including anticoagulation     2. LE edema/RV failure with cor pulmonale - edema is controlled, cotninue lasix and compression stockings.      Arnoldo Lenis, M.D.

## 2020-12-27 ENCOUNTER — Other Ambulatory Visit: Payer: Self-pay

## 2020-12-27 ENCOUNTER — Telehealth: Payer: Self-pay | Admitting: Internal Medicine

## 2020-12-27 DIAGNOSIS — A310 Pulmonary mycobacterial infection: Secondary | ICD-10-CM

## 2020-12-27 NOTE — Telephone Encounter (Signed)
Spoke to patient. CXR order placed. Pt aware. Nothing further needed.

## 2020-12-28 ENCOUNTER — Ambulatory Visit: Payer: Medicare Other | Admitting: Cardiology

## 2021-01-09 ENCOUNTER — Other Ambulatory Visit: Payer: Self-pay | Admitting: Cardiology

## 2021-02-13 ENCOUNTER — Other Ambulatory Visit: Payer: Self-pay

## 2021-02-13 ENCOUNTER — Ambulatory Visit (HOSPITAL_COMMUNITY)
Admission: RE | Admit: 2021-02-13 | Discharge: 2021-02-13 | Disposition: A | Discharge status: Home / Self Care | Payer: Medicare Other | Source: Ambulatory Visit | Attending: Internal Medicine | Admitting: Internal Medicine

## 2021-02-13 DIAGNOSIS — A310 Pulmonary mycobacterial infection: Secondary | ICD-10-CM

## 2021-02-20 ENCOUNTER — Other Ambulatory Visit: Payer: Self-pay

## 2021-02-20 ENCOUNTER — Encounter: Payer: Self-pay | Admitting: Internal Medicine

## 2021-02-20 ENCOUNTER — Ambulatory Visit: Payer: Medicare Other | Admitting: Internal Medicine

## 2021-02-20 DIAGNOSIS — J449 Chronic obstructive pulmonary disease, unspecified: Secondary | ICD-10-CM | POA: Diagnosis not present

## 2021-02-20 DIAGNOSIS — A310 Pulmonary mycobacterial infection: Secondary | ICD-10-CM | POA: Diagnosis not present

## 2021-02-20 DIAGNOSIS — J9612 Chronic respiratory failure with hypercapnia: Secondary | ICD-10-CM | POA: Diagnosis not present

## 2021-02-20 DIAGNOSIS — J9611 Chronic respiratory failure with hypoxia: Secondary | ICD-10-CM

## 2021-02-20 NOTE — Progress Notes (Signed)
David Stein, male    DOB: 01-Aug-1945   MRN:  [id number]   Brief patient profile:  71 yowm quit smoking 2017  with "bronchitis" hx as child and  never really got over it or had nl aerobic activity but played basketball in HS and started using some albuterol as needed in his 18s and then symbicort/spiriva maint since later 41s helps some and referred to pulmonary clinic 08/24/2019 by Dr  Harl Bowie at [facility]    History of Present Illness  08/24/2019  Pulmonary/ 1st office eval/David Stein s/p moderna vaccination for covid 19  Chief Complaint  Patient presents with   Pulmonary Consult    Referred by Dr. Carlyle Dolly. Pt states dx with COPD in 2009. He c/o SOB since then and has had a sore throat x 1 wk. Recently took zpack and this helped some.   Dyspnea:  50 ft on 4lpm pulse with sats in 90s Cough: none  Sleep: flat in bed one pillow SABA use: 1st thing then gets in power chair all day / only hfa, not neb  Never prechallenges or rechallenges  REC Plan A = Automatic = Always=    symbicort 160 x 2 puffs followed by spiriva x 2 pffs and then 12 hours later repeat the symbicort 160 x 2  Work on inhaler technique:   Plan B = Backup (to supplement plan A, not to replace it) Only use your albuterol inhaler as a rescue medication Plan C = Crisis (instead of Plan B but only if Plan B stops working) - only use your albuterol nebulizer if you first try Plan B and it fails to help  Try albuterol 15 min before an activity that you know would make you short of breath and see if it makes any difference and if makes none then don't take it after activity unless you can't catch your breath.  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 Patient wears 3 liters all the time. Sometimes has a non-productive cough.    Dyspnea:  mostly w/c dep now  Cough: sometimes feels strangled with thin liquids  Sleeping: bed is flat wedge pillow SABA use: first thing in am proair, no neb use  02: 3lpm 24 /h Covid status: x 2 vax  Lung cancer screening: n/a  Rec Plan A = Automatic = Always=   First thing in am symbicort 160 x 2 puffs followed by spiriva x 2 pffs and then 12 hours later repeat the symbicort 160 x 2  Work on inhaler technique:   Plan B = Backup (to supplement plan A, not to replace it) Only use your albuterol inhaler as a rescue medication Plan C = Crisis (instead of Plan B but only if Plan B stops working) - only use your albuterol nebulizer if you first try Plan B and it fails to help    10/31/2020  f/u ov/ office/David Stein re: Group D copd maint on symb/spiriva / 3lpm  Chief Complaint  Patient  presents with   Follow-up    Pt c/o increased SOB just since this morning. No coughing or wheezing. He took a dose of prednisone and states this helped. He says keeps pred on hand if needed. He is using his proair 1-2 x per day on average and rarely uses neb.   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 SOB has not changed. 3-4lpm pulse oxygen when  moving around. 3lpm cont at home    Dyspnea:  also limited hips/back  x 25 ft  Cough: none  Sleeping: on a wedge  SABA use: hfa maybe once a day, very seldom on neb  02: 4-4lpm  Covid status: vax x 3  - to get #4 02/21/21      No obvious day to day or daytime variability or assoc excess/ purulent sputum or mucus plugs or hemoptysis or cp or chest tightness, subjective wheeze or overt sinus or hb symptoms.   Sleeping as above  without nocturnal  or early am exacerbation  of respiratory  c/o's or need for noct saba. Also denies any obvious fluctuation of symptoms with weather or environmental changes or other aggravating or alleviating factors except as outlined above   No unusual exposure hx or h/o childhood pna/ asthma or knowledge of premature birth.  Current Allergies, Complete Past Medical History, Past Surgical History, Family History, and Social History were reviewed in [employer] record.  ROS  The following are not active complaints unless bolded Hoarseness, sore throat, dysphagia, dental problems, itching, sneezing,  nasal congestion or discharge of excess mucus or purulent secretions, ear ache,   fever, chills, sweats, unintended wt loss or wt gain, classically pleuritic or exertional cp,  orthopnea pnd or arm/hand swelling  or leg swelling, presyncope, palpitations, abdominal pain, anorexia, nausea, vomiting, diarrhea  or change in bowel habits or change in bladder habits, change in stools or change in urine, dysuria, hematuria,  rash, arthralgias, visual complaints, headache, numbness, weakness or ataxia or problems with walking or coordination,  change in mood or  memory.        Current Meds  Medication Sig   acetaminophen (TYLENOL) 500 MG tablet Take 500 mg by mouth every 6 (six) hours as needed.   albuterol (PROVENTIL) (2.5 MG/3ML) 0.083% nebulizer solution Take 3 mLs (2.5 mg total) by nebulization every 4 (four) hours as needed for wheezing or shortness  of breath.   ALPRAZolam (XANAX) 0.5 MG tablet Take 0.5 mg by mouth every 8 (eight) hours as needed for anxiety.   ALPRAZOLAM PO Take 1 tablet by mouth as needed.   amiodarone (PACERONE) 200 MG tablet Take 0.5 tablets (100 mg total) by mouth daily.   antiseptic oral rinse (BIOTENE) LIQD 15 mLs by Mouth Rinse route as needed for dry mouth.   apixaban (ELIQUIS) 5 MG TABS tablet Take 1 tablet (5 mg total) by mouth 2 (two) times  daily.   budesonide-formoterol (SYMBICORT) 160-4.5 MCG/ACT inhaler Inhale 2 puffs into the lungs 2 (two) times daily.   clotrimazole-betamethasone (LOTRISONE) cream Apply 1 application topically daily.   diltiazem (CARDIZEM CD) 240 MG 24 hr capsule TAKE (1) CAPSULE BY MOUTH EVERY DAY.   Elastic Bandages & Supports (M-4 KNEE HIGH STOCKINGS) MISC 1 each by Does not apply route as directed.   ferrous sulfate 325 (65 FE) MG tablet Take 325 mg by mouth daily with breakfast.   fluocinonide cream (LIDEX) 0.05 % Apply topically 3 (three) times daily as needed.   furosemide (LASIX) 20 MG tablet Take 1 tablet (20 mg total) by mouth daily. May take an additional tablet daily as needed   guaiFENesin (MUCINEX) 600 MG 12 hr tablet Take 1 tablet (600 mg total) by mouth 2 (two) times daily.   HYDROcodone-acetaminophen (NORCO) 10-325 MG tablet Take 1 tablet by mouth 4 (four) times daily as needed.   levothyroxine (SYNTHROID, LEVOTHROID) 50 MCG tablet Take 50 mcg by mouth daily before breakfast.   Multiple Vitamin (MULTIVITAMIN WITH MINERALS) TABS tablet Take 1 tablet by mouth daily.   PROAIR HFA 108 (90 Base) MCG/ACT inhaler Inhale 1 puff into the lungs every 6 (six) hours as needed for wheezing or shortness of breath.   rosuvastatin (CRESTOR) 10 MG tablet Take 10 mg by mouth daily.   Tiotropium Bromide Monohydrate (SPIRIVA RESPIMAT) 2.5 MCG/ACT AERS Inhale 2 puffs into the lungs daily.   traZODone (DESYREL) 50 MG tablet Take 50 mg by mouth at bedtime.   VITAMIN D PO Take 1 tablet by mouth  daily.                         Past Medical History:  Diagnosis Date   Arthritis    COPD (chronic obstructive pulmonary disease) ([place])    Kidney stones    Oxygen dependent    2.5L   Restless leg syndrome    Shortness of breath dyspnea         Objective:      02/20/2021    200 est  10/31/2020      214    09/17/20 204 lb (92.5 kg)  08/22/20 208 lb (94.3 kg)  08/02/20 204 lb (92.5 kg)     Vital signs reviewed  02/20/2021  - Note at rest 02 sats  98% on 3lpm pulse   General appearance:    pleasant wc bound wm nad       HEENT : pt wearing mask not removed for exam due to covid -19 concerns.    NECK :  without JVD/Nodes/TM/ nl carotid upstrokes bilaterally   LUNGS: no acc muscle use,  Nl contour chest with  min insp/ exp rhonchi bilaterally without cough on insp or exp maneuvers   CV:  RRR  no s3 or murmur or increase in P2, and trace pitting edema both LE's   ABD:  soft and nontender with nl inspiratory excursion in the supine position. No bruits or organomegaly appreciated, bowel sounds nl  MS:    ext warm without deformities, calf tenderness, cyanosis or clubbing No obvious joint restrictions   SKIN: warm and dry without lesions    NEURO:  alert, approp, nl sensorium with  no motor or cerebellar deficits apparent.           I personally reviewed images and agree with radiology impression as follows:  CXR:   pa and lat 03/15/21 Similar appearance of emphysema, bilateral  reticulonodular opacities, with no definite evidence of superimposed acute cardiopulmonary disease      Assessment

## 2021-02-20 NOTE — Patient Instructions (Addendum)
Make sure you check your oxygen saturation  AT  your highest level of activity (not after you stop)   to be sure it stays over 90% and adjust  02 flow upward to maintain this level if needed but remember to turn it back to previous settings when you stop (to conserve your supply).    We will have [employer] supply you with humidified 02    Please schedule a follow up visit in 6 months but call sooner if needed

## 2021-02-21 ENCOUNTER — Encounter: Payer: Self-pay | Admitting: Internal Medicine

## 2021-02-21 NOTE — Assessment & Plan Note (Addendum)
02 dep since his 10s - [facility]03  12/27/2015  = 40  - [facility]03   07/13/20     = 36  See chronic resp failure with hypoxemia/ hypercarbia  - HRCT  09/26/20 1. 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 2. 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

## 2021-02-21 NOTE — Assessment & Plan Note (Signed)
Quit smoking 2017 with hx of "bronchitis all his life" - 08/24/2019  After extensive coaching inhaler device,  effectiveness =    75% so continue symb/spriva smi  - uses prednisone prn since 2017  - 10/31/2020  After extensive coaching inhaler device,  effectiveness =    90%    Group D in terms of symptom/risk and laba/lama/ICS  therefore appropriate rx at this point >>>  Well compensated at present on symb/spiriva > no change rx

## 2021-02-21 NOTE — Assessment & Plan Note (Signed)
See HRCT  10/12/20  No clinical or gross cxr evidence of dz progression or assoc amiodarone pulmonary toxicity at this point          Each maintenance medication was reviewed in detail including emphasizing most importantly the difference between maintenance and prns and under what circumstances the prns are to be triggered using an action plan format where appropriate.  Total time for H and P, chart review, counseling, reviewing hfa/smi  device(s) and generating customized AVS unique to this office visit / same day charting = 32 min

## 2021-03-06 ENCOUNTER — Other Ambulatory Visit: Payer: Self-pay | Admitting: Internal Medicine

## 2021-06-04 ENCOUNTER — Ambulatory Visit: Payer: Medicare Other | Admitting: Internal Medicine

## 2021-06-04 ENCOUNTER — Encounter: Payer: Self-pay | Admitting: Internal Medicine

## 2021-06-04 VITALS — BP 116/54 | HR 75 | Ht 74.0 in | Wt 180.8 lb

## 2021-06-04 DIAGNOSIS — I4892 Unspecified atrial flutter: Secondary | ICD-10-CM

## 2021-06-04 NOTE — Patient Instructions (Signed)
Medication Instructions:  Your physician recommends that you continue on your current medications as directed. Please refer to the Current Medication list given to you today.  *If you need a refill on your cardiac medications before your next appointment, please call your pharmacy*   Lab Work: NONE   If you have labs (blood work) drawn today and your tests are completely normal, you will receive your results only by: . MyChart Message (if you have MyChart) OR . A paper copy in the mail If you have any lab test that is abnormal or we need to change your treatment, we will call you to review the results.   Testing/Procedures: NONE    Follow-Up: At [facility], you and your health needs are our priority.  As part of our continuing mission to provide you with exceptional heart care, we have created designated Provider Care Teams.  These Care Teams include your primary Cardiologist (physician) and Advanced Practice Providers (APPs -  Physician Assistants and Nurse Practitioners) who all work together to provide you with the care you need, when you need it.  We recommend signing up for the patient portal called "MyChart".  Sign up information is provided on this After Visit Summary.  MyChart is used to connect with patients for Virtual Visits (Telemedicine).  Patients are able to view lab/test results, encounter notes, upcoming appointments, etc.  Non-urgent messages can be sent to your provider as well.   To learn more about what you can do with MyChart, go to [email or web address].    Your next appointment:   1 year(s)  The format for your next appointment:   In Person  Provider:   Gregg Taylor, MD   Other Instructions Thank you for choosing [facility]!    

## 2021-06-04 NOTE — Progress Notes (Signed)
? ? ? ? ?HPI ?[name] returns today for followup. He is a pleasant 76 yo man with oxygen dependent COPD and initial atrial flutter back in 2017. At that time he was placed on amiodarone and was kept on the amiodarone for about a year. He was stopped and did well until earlier this year when he developed recurrent atrial flutter. He was placed back on amiodarone 200 mg daily and is now maintaining NSR on 100 mg daily.  He has not had more in the way of symptomatic atrial flutter. He denies a cough and his chronic dyspnea has not worsened. The patient denies syncope. No cough. His sats are running in the 95 range on 3 liters of oxygen. ?Allergies  ?Allergen Reactions  ? Codeine Other (See Comments)  ?  headache  ? ? ? ?Current Outpatient Medications  ?Medication Sig Dispense Refill  ? acetaminophen (TYLENOL) 500 MG tablet Take 500 mg by mouth every 6 (six) hours as needed.    ? albuterol (PROVENTIL) (2.5 MG/3ML) 0.083% nebulizer solution Take 3 mLs (2.5 mg total) by nebulization every 4 (four) hours as needed for wheezing or shortness of breath. 75 mL 11  ? ALPRAZolam (XANAX) 0.5 MG tablet Take 0.5 mg by mouth every 8 (eight) hours as needed for anxiety.    ? amiodarone (PACERONE) 200 MG tablet Take 0.5 tablets (100 mg total) by mouth daily. 45 tablet 3  ? antiseptic oral rinse (BIOTENE) LIQD 15 mLs by Mouth Rinse route as needed for dry mouth.    ? apixaban (ELIQUIS) 5 MG TABS tablet Take 1 tablet (5 mg total) by mouth 2 (two) times daily. 60 tablet 0  ? azithromycin (ZITHROMAX) 250 MG tablet TAKE 2 TABLETS TODAY, THEN 1 TABLET DAILY FOR 4 DAYS. 6 tablet 0  ? budesonide-formoterol (SYMBICORT) 160-4.5 MCG/ACT inhaler Inhale 2 puffs into the lungs 2 (two) times daily.    ? clotrimazole-betamethasone (LOTRISONE) cream Apply 1 application topically daily.    ? diltiazem (CARDIZEM CD) 240 MG 24 hr capsule TAKE (1) CAPSULE BY MOUTH EVERY DAY. 90 capsule 2  ? Elastic Bandages & Supports (M-4 KNEE HIGH STOCKINGS) MISC 1  each by Does not apply route as directed. 1 each 0  ? ferrous sulfate 325 (65 FE) MG tablet Take 325 mg by mouth daily with breakfast.    ? fluocinonide cream (LIDEX) 0.05 % Apply topically 3 (three) times daily as needed.    ? guaiFENesin (MUCINEX) 600 MG 12 hr tablet Take 1 tablet (600 mg total) by mouth 2 (two) times daily.    ? HYDROcodone-acetaminophen (NORCO) 10-325 MG tablet Take 1 tablet by mouth 4 (four) times daily as needed.    ? levothyroxine (SYNTHROID, LEVOTHROID) 50 MCG tablet Take 50 mcg by mouth daily before breakfast.    ? Multiple Vitamin (MULTIVITAMIN WITH MINERALS) TABS tablet Take 1 tablet by mouth daily.    ? PROAIR HFA 108 (90 Base) MCG/ACT inhaler Inhale 1 puff into the lungs every 6 (six) hours as needed for wheezing or shortness of breath.    ? rosuvastatin (CRESTOR) 10 MG tablet Take 10 mg by mouth daily.    ? Tiotropium Bromide Monohydrate (SPIRIVA RESPIMAT) 2.5 MCG/ACT AERS Inhale 2 puffs into the lungs daily. 4 g 0  ? traZODone (DESYREL) 50 MG tablet Take 50 mg by mouth at bedtime.    ? ALPRAZOLAM PO Take 1 tablet by mouth as needed. (Patient not taking: Reported on 06/04/2021)    ? furosemide (LASIX) 20  MG tablet Take 1 tablet (20 mg total) by mouth daily. May take an additional tablet daily as needed (Patient not taking: Reported on 06/04/2021) 180 tablet 3  ? VITAMIN D PO Take 1 tablet by mouth daily. (Patient not taking: Reported on 06/04/2021)    ? ?No current facility-administered medications for this visit.  ? ? ? ?Past Medical History:  ?Diagnosis Date  ? Arthritis   ? Atrial flutter ([place])   ? COPD (chronic obstructive pulmonary disease) ([place])   ? Kidney stones   ? Oxygen dependent   ? 2.5L  ? Restless leg syndrome   ? Shortness of breath dyspnea   ? ? ?ROS: ? ? All systems reviewed and negative except as noted in the HPI. ? ? ?Past Surgical History:  ?Procedure Laterality Date  ? CATARACT EXTRACTION W/PHACO Right 01/31/2014  ? Procedure: CATARACT EXTRACTION PHACO AND INTRAOCULAR  LENS PLACEMENT RIGHT EYE CDE=6.90;  Surgeon: Elta Guadeloupe T. Gershon Crane, MD;  Location: [facility];  Service: Ophthalmology;  Laterality: Right;  ? CATARACT EXTRACTION W/PHACO Left 02/14/2014  ? Procedure: CATARACT EXTRACTION PHACO AND INTRAOCULAR LENS PLACEMENT; CDE:  8.34;  Surgeon: Elta Guadeloupe T. Gershon Crane, MD;  Location: [facility];  Service: Ophthalmology;  Laterality: Left;  ? KNEE SURGERY Left   ? ? ? ?Family History  ?Problem Relation Age of Onset  ? Rheumatic fever Father   ? Alzheimer's disease Mother   ? Stroke Sister   ? ? ? ?Social History  ? ?Socioeconomic History  ? Marital status: Married  ?  Spouse name: Not on file  ? Number of children: Not on file  ? Years of education: Not on file  ? Highest education level: Not on file  ?Occupational History  ? Not on file  ?Tobacco Use  ? Smoking status: Former  ?  Packs/day: 0.25  ?  Years: 55.00  ?  Pack years: 13.75  ?  Types: Cigarettes  ?  Quit date: 12/12/2015  ?  Years since quitting: 5.4  ? Smokeless tobacco: Never  ?Vaping Use  ? Vaping Use: Never used  ?Substance and Sexual Activity  ? Alcohol use: No  ? Drug use: No  ? Sexual activity: Not Currently  ?Other Topics Concern  ? Not on file  ?Social History Narrative  ? Not on file  ? ?Social Determinants of Health  ? ?Financial Resource Strain: Not on file  ?Food Insecurity: Not on file  ?Transportation Needs: Not on file  ?Physical Activity: Not on file  ?Stress: Not on file  ?Social Connections: Not on file  ?Intimate Partner Violence: Not on file  ? ? ? ?BP (!) 116/54   Pulse 75   Ht '6\' 2"'$  (1.88 m)   Wt 180 lb 12.8 oz (82 kg)   SpO2 96%   BMI 23.21 kg/m?  ? ?Physical Exam: ? ?Well appearing NAD ?HEENT: Unremarkable ?Neck:  No JVD, no thyromegally ?Lymphatics:  No adenopathy ?Back:  No CVA tenderness ?Lungs:  Clear ?HEART:  Regular rate rhythm, no murmurs, no rubs, no clicks ?Abd:  soft, positive bowel sounds, no organomegally, no rebound, no guarding ?Ext:  2 plus pulses, no edema, no cyanosis, no clubbing ?Skin:  No  rashes no nodules ?Neuro:  CN II through XII intact, motor grossly intact ? ?EKG - nsr with rbbb ? ?DEVICE  ?Normal device function.  See PaceArt for details.  ? ?Assess/Plan:  ?1. Atrial flutter - he is maintaining NSR on amiodarone. He appears to be tolerating low dose amio and  is maintaining NSR.  ?2. Coags - he will continue systemic anti-coagulation with eliquis. ?3. COPD - he is on 3 liters of oxygen with nasal cannula.  ?4. Chronic diastolic heart failure - his symptoms are well compensated. We will follow and he will avoid salty foods. ?  ?Carleene Overlie Shadd Dunstan,MD ?

## 2021-06-18 ENCOUNTER — Encounter: Payer: Self-pay | Admitting: *Deleted

## 2021-06-18 ENCOUNTER — Encounter: Payer: Self-pay | Admitting: Cardiology

## 2021-06-18 ENCOUNTER — Ambulatory Visit: Payer: Medicare Other | Admitting: Cardiology

## 2021-06-18 VITALS — BP 116/78 | HR 65 | Ht 74.0 in | Wt 203.0 lb

## 2021-06-18 DIAGNOSIS — I4892 Unspecified atrial flutter: Secondary | ICD-10-CM | POA: Diagnosis not present

## 2021-06-18 DIAGNOSIS — E782 Mixed hyperlipidemia: Secondary | ICD-10-CM

## 2021-06-18 DIAGNOSIS — R600 Localized edema: Secondary | ICD-10-CM

## 2021-06-18 DIAGNOSIS — I50812 Chronic right heart failure: Secondary | ICD-10-CM | POA: Diagnosis not present

## 2021-06-18 NOTE — Patient Instructions (Signed)
Medication Instructions:  Continue all current medications.   Labwork: none  Testing/Procedures: none  Follow-Up: 6 months   Any Other Special Instructions Will Be Listed Below (If Applicable).   If you need a refill on your cardiac medications before your next appointment, please call your pharmacy.  

## 2021-06-18 NOTE — Progress Notes (Signed)
? ? ? ?Clinical Summary ?David Stein is a 76 y.o.male seen today for follow up of the following medical problems.  ?  ?1. Aflutter ?- we previously stopped amio as he was doing well, initially started during admission in 2017 with pneumonia where his aflutter was difficult to control ?  ?  ?  ?- admitted 06/2020 with palpitations, found to be in aflutter ?- paroxysmal aflutter during admission, DCCV not pursued ?- started on oral amio during that admission ?  ?- no recent palpitations ?- compliant with meds. No bleeding on eliquis.  ?- followed in EP clinic ?  ?  ?2. COPD ?- followed by Dr Melvyn Novas, on home O2 3L ?  ? 3. LE edema/ LV diastolic HF/RV systolic HF ? - several year history ?- 06/2020 echo LVEF 55-60%, grade I dd, severe RV dysfunction ?  ? ? ?- swelling well controlled, takes prn just twice a week.  ? ? ?4. Chest pain ?-left sided, sharp pain. Occurs at rest. Has not noticed if positional. Lasts just a few seconds. No relation to food or eating. Occurs Occurs about 2 times.  ? ?5. Hyperlipidemia ?- compliant with crestor '10mg'$   ?Past Medical History:  ?Diagnosis Date  ? Arthritis   ? Atrial flutter ([place])   ? COPD (chronic obstructive pulmonary disease) ([place])   ? Kidney stones   ? Oxygen dependent   ? 2.5L  ? Restless leg syndrome   ? Shortness of breath dyspnea   ? ? ? ?Allergies  ?Allergen Reactions  ? Codeine Other (See Comments)  ?  headache  ? ? ? ?Current Outpatient Medications  ?Medication Sig Dispense Refill  ? acetaminophen (TYLENOL) 500 MG tablet Take 500 mg by mouth every 6 (six) hours as needed.    ? albuterol (PROVENTIL) (2.5 MG/3ML) 0.083% nebulizer solution Take 3 mLs (2.5 mg total) by nebulization every 4 (four) hours as needed for wheezing or shortness of breath. 75 mL 11  ? ALPRAZolam (XANAX) 0.5 MG tablet Take 0.5 mg by mouth every 8 (eight) hours as needed for anxiety.    ? ALPRAZOLAM PO Take 1 tablet by mouth as needed. (Patient not taking: Reported on 06/04/2021)    ? amiodarone (PACERONE)  200 MG tablet Take 0.5 tablets (100 mg total) by mouth daily. 45 tablet 3  ? antiseptic oral rinse (BIOTENE) LIQD 15 mLs by Mouth Rinse route as needed for dry mouth.    ? apixaban (ELIQUIS) 5 MG TABS tablet Take 1 tablet (5 mg total) by mouth 2 (two) times daily. 60 tablet 0  ? azithromycin (ZITHROMAX) 250 MG tablet TAKE 2 TABLETS TODAY, THEN 1 TABLET DAILY FOR 4 DAYS. 6 tablet 0  ? budesonide-formoterol (SYMBICORT) 160-4.5 MCG/ACT inhaler Inhale 2 puffs into the lungs 2 (two) times daily.    ? clotrimazole-betamethasone (LOTRISONE) cream Apply 1 application topically daily.    ? diltiazem (CARDIZEM CD) 240 MG 24 hr capsule TAKE (1) CAPSULE BY MOUTH EVERY DAY. 90 capsule 2  ? Elastic Bandages & Supports (M-4 KNEE HIGH STOCKINGS) MISC 1 each by Does not apply route as directed. 1 each 0  ? ferrous sulfate 325 (65 FE) MG tablet Take 325 mg by mouth daily with breakfast.    ? fluocinonide cream (LIDEX) 0.05 % Apply topically 3 (three) times daily as needed.    ? furosemide (LASIX) 20 MG tablet Take 1 tablet (20 mg total) by mouth daily. May take an additional tablet daily as needed (Patient not taking: Reported on  06/04/2021) 180 tablet 3  ? guaiFENesin (MUCINEX) 600 MG 12 hr tablet Take 1 tablet (600 mg total) by mouth 2 (two) times daily.    ? HYDROcodone-acetaminophen (NORCO) 10-325 MG tablet Take 1 tablet by mouth 4 (four) times daily as needed.    ? levothyroxine (SYNTHROID, LEVOTHROID) 50 MCG tablet Take 50 mcg by mouth daily before breakfast.    ? Multiple Vitamin (MULTIVITAMIN WITH MINERALS) TABS tablet Take 1 tablet by mouth daily.    ? PROAIR HFA 108 (90 Base) MCG/ACT inhaler Inhale 1 puff into the lungs every 6 (six) hours as needed for wheezing or shortness of breath.    ? rosuvastatin (CRESTOR) 10 MG tablet Take 10 mg by mouth daily.    ? Tiotropium Bromide Monohydrate (SPIRIVA RESPIMAT) 2.5 MCG/ACT AERS Inhale 2 puffs into the lungs daily. 4 g 0  ? traZODone (DESYREL) 50 MG tablet Take 50 mg by mouth at  bedtime.    ? VITAMIN D PO Take 1 tablet by mouth daily. (Patient not taking: Reported on 06/04/2021)    ? ?No current facility-administered medications for this visit.  ? ? ? ?Past Surgical History:  ?Procedure Laterality Date  ? CATARACT EXTRACTION W/PHACO Right 01/31/2014  ? Procedure: CATARACT EXTRACTION PHACO AND INTRAOCULAR LENS PLACEMENT RIGHT EYE CDE=6.90;  Surgeon: Elta Guadeloupe T. Gershon Crane, MD;  Location: [facility];  Service: Ophthalmology;  Laterality: Right;  ? CATARACT EXTRACTION W/PHACO Left 02/14/2014  ? Procedure: CATARACT EXTRACTION PHACO AND INTRAOCULAR LENS PLACEMENT; CDE:  8.34;  Surgeon: Elta Guadeloupe T. Gershon Crane, MD;  Location: [facility];  Service: Ophthalmology;  Laterality: Left;  ? KNEE SURGERY Left   ? ? ? ?Allergies  ?Allergen Reactions  ? Codeine Other (See Comments)  ?  headache  ? ? ? ? ?Family History  ?Problem Relation Age of Onset  ? Rheumatic fever Father   ? Alzheimer's disease Mother   ? Stroke Sister   ? ? ? ?Social History ?David Stein reports that he quit smoking about 5 years ago. His smoking use included cigarettes. He has a 13.75 pack-year smoking history. He has never used smokeless tobacco. ?David Stein reports no history of alcohol use. ? ? ?Review of Systems ?CONSTITUTIONAL: No weight loss, fever, chills, weakness or fatigue.  ?HEENT: Eyes: No visual loss, blurred vision, double vision or yellow sclerae.No hearing loss, sneezing, congestion, runny nose or sore throat.  ?SKIN: No rash or itching.  ?CARDIOVASCULAR: per hpi ?RESPIRATORY: chronic SOB ?GASTROINTESTINAL: No anorexia, nausea, vomiting or diarrhea. No abdominal pain or blood.  ?GENITOURINARY: No burning on urination, no polyuria ?NEUROLOGICAL: No headache, dizziness, syncope, paralysis, ataxia, numbness or tingling in the extremities. No change in bowel or bladder control.  ?MUSCULOSKELETAL: No muscle, back pain, joint pain or stiffness.  ?LYMPHATICS: No enlarged nodes. No history of splenectomy.  ?PSYCHIATRIC: No history of depression or  anxiety.  ?ENDOCRINOLOGIC: No reports of sweating, cold or heat intolerance. No polyuria or polydipsia.  ?. ? ? ?Physical Examination ?Today's Vitals  ? 06/18/21 1309  ?BP: 116/78  ?Pulse: 65  ?SpO2: 93%  ?Weight: 203 lb (92.1 kg)  ?Height: '6\' 2"'$  (1.88 m)  ? ?Body mass index is 26.06 kg/m?. ? ?Gen: resting comfortably, no acute distress ?HEENT: no scleral icterus, pupils equal round and reactive, no palptable cervical adenopathy,  ?CV: RRR, no m/r/g no jvd ?Resp: Clear to auscultation bilaterally ?GI: abdomen is soft, non-tender, non-distended, normal bowel sounds, no hepatosplenomegaly ?MSK: extremities are warm, no edema.  ?Skin: warm, no rash ?Neuro:  no  focal deficits ?Psych: appropriate affect ? ? ?Diagnostic Studies ? ?11/2015 echo ?Study Conclusions ?  ?- Left ventricle: The cavity size was normal. Wall thickness was ?  normal. Systolic function was normal. The estimated ejection ?  fraction was in the range of 55% to 60%. Although no diagnostic ?  regional wall motion abnormality was identified, this possibility ?  cannot be completely excluded on the basis of this study. ?- Ventricular septum: Septal motion showed abnormal function, ?  dyssynergy, and paradox. The contour showed systolic flattening. ?  These changes are consistent with RV pressure overload. ?  ?Impressions: ?  ?- The systolic PA pressure could not be estimated due to the ?  absence of TR jet. The 2D findings are suspicious for severely ?  elevated PA pressure. ?  ?  ?06/2020 echo ?IMPRESSIONS  ? ? ? 1. Left ventricular ejection fraction, by estimation, is approximately 55  ?to 60%. The left ventricle has normal function. Left ventricular  ?endocardial border not optimally defined to evaluate regional wall motion.  ?There is mild left ventricular  ?hypertrophy. Left ventricular diastolic parameters are consistent with  ?Grade I diastolic dysfunction (impaired relaxation).  ? 2. Right ventricular systolic function is severely reduced. The right   ?ventricular size is moderately enlarged. Tricuspid regurgitation signal is  ?inadequate for assessing PA pressure.  ? 3. The mitral valve is grossly normal. Trivial mitral valve  ?regurgitation.

## 2021-06-25 DIAGNOSIS — Z0001 Encounter for general adult medical examination with abnormal findings: Secondary | ICD-10-CM | POA: Diagnosis not present

## 2021-06-25 DIAGNOSIS — Z6825 Body mass index (BMI) 25.0-25.9, adult: Secondary | ICD-10-CM | POA: Diagnosis not present

## 2021-06-25 DIAGNOSIS — Z1331 Encounter for screening for depression: Secondary | ICD-10-CM | POA: Diagnosis not present

## 2021-06-25 DIAGNOSIS — F4312 Post-traumatic stress disorder, chronic: Secondary | ICD-10-CM | POA: Diagnosis not present

## 2021-06-25 DIAGNOSIS — E559 Vitamin D deficiency, unspecified: Secondary | ICD-10-CM | POA: Diagnosis not present

## 2021-06-25 DIAGNOSIS — E063 Autoimmune thyroiditis: Secondary | ICD-10-CM | POA: Diagnosis not present

## 2021-06-25 DIAGNOSIS — J9610 Chronic respiratory failure, unspecified whether with hypoxia or hypercapnia: Secondary | ICD-10-CM | POA: Diagnosis not present

## 2021-07-10 ENCOUNTER — Encounter: Payer: Self-pay | Admitting: Internal Medicine

## 2021-07-24 DIAGNOSIS — Z1211 Encounter for screening for malignant neoplasm of colon: Secondary | ICD-10-CM | POA: Diagnosis not present

## 2021-08-16 NOTE — Progress Notes (Deleted)
GI Office Note    Referring Provider: Redmond School, MD Primary Care Physician:  Redmond School, MD  Primary Gastroenterologist: Dr. Gala Romney  Chief Complaint   No chief complaint on file.    History of Present Illness   David Stein is a 76 y.o. male presenting today at the request of Redmond School, MD for ***surveillance colonoscopy.  Last colonoscopy 08/20/10 - one polyp in the transverse colon (tubular adenoma). Due for repeat in 5 years.   Follows with cardiology for chronic diastolic heart failure and atrial flutter. Last seen 06/18/21. He denied any recent palpitations, no bleeding events. On home O2 3L, swelling well controlled taking prn diuretic about twice a week. Reported left sided sharp pain that occurs at rest lasting a few seconds. Continue oral amiodarone, continue prn lasix, continue crestor.  Last ECHO 06/2020  - EF 40-10%, grade 1 diastolic dysfunction, RV systolic dysfunction  Follows with Dr. Melvyn Novas for COPD. Last seen 01/2021.Using Spiriva and Symbicort, on 3-4lpm oxygen at home. No change in his shortness of breath, denied cough, sleeping with a wedge. 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 Early satiety -  Weight loss -  Abdominal pain -  Constipation -  Diarrhea -     Past Medical History:  Diagnosis Date   Arthritis    Atrial flutter ([facility])    COPD (chronic obstructive pulmonary disease) ([facility])    Kidney stones    Oxygen dependent    2.5L   Restless leg syndrome    Shortness of breath dyspnea     Past Surgical History:  Procedure Laterality Date   CATARACT EXTRACTION W/PHACO Right 01/31/2014   Procedure: CATARACT EXTRACTION PHACO AND INTRAOCULAR LENS PLACEMENT RIGHT EYE CDE=6.90;  Surgeon: Elta Guadeloupe T.  Gershon Crane, MD;  Location: [facility];  Service: Ophthalmology;  Laterality: Right;   CATARACT EXTRACTION W/PHACO Left 02/14/2014   Procedure: CATARACT EXTRACTION PHACO AND INTRAOCULAR LENS PLACEMENT; CDE:  8.34;  Surgeon: Elta Guadeloupe T. Gershon Crane, MD;  Location: [facility];  Service: Ophthalmology;  Laterality: Left;   KNEE SURGERY Left     Current Outpatient Medications  Medication Sig Dispense Refill   acetaminophen (TYLENOL) 500 MG tablet Take 500 mg by mouth every 6 (six) hours as needed.     albuterol (PROVENTIL) (2.5 MG/3ML) 0.083% nebulizer solution Take 3 mLs (2.5 mg total) by nebulization every 4 (four) hours as needed for wheezing or shortness of breath. 75 mL 11   ALPRAZolam (XANAX) 0.5 MG tablet Take 0.5 mg by mouth every 8 (eight) hours as needed for anxiety.     amiodarone (PACERONE) 200 MG tablet Take 0.5 tablets (100 mg total) by mouth daily. 45 tablet 3   antiseptic oral rinse (BIOTENE) LIQD 15 mLs by Mouth Rinse route as needed for dry mouth.     apixaban (ELIQUIS) 5 MG TABS tablet Take 1 tablet (5 mg total) by mouth 2 (two) times daily. 60 tablet 0   azithromycin (ZITHROMAX) 250 MG tablet TAKE 2 TABLETS TODAY, THEN 1 TABLET DAILY FOR 4 DAYS. 6 tablet 0   budesonide-formoterol (SYMBICORT) 160-4.5 MCG/ACT inhaler Inhale 2 puffs into the lungs 2 (two) times daily.     clotrimazole-betamethasone (LOTRISONE) cream Apply 1 application topically daily.  diltiazem (CARDIZEM CD) 240 MG 24 hr capsule TAKE (1) CAPSULE BY MOUTH EVERY DAY. 90 capsule 2   Elastic Bandages & Supports (M-4 KNEE HIGH STOCKINGS) MISC 1 each by Does not apply route as directed. 1 each 0   ferrous sulfate 325 (65 FE) MG tablet Take 325 mg by mouth daily with breakfast.     fluocinonide cream (LIDEX) 0.05 % Apply topically 3 (three) times daily as needed.     furosemide (LASIX) 20 MG tablet Take 20 mg by mouth as needed.     guaiFENesin (MUCINEX) 600 MG 12 hr tablet Take 1 tablet (600 mg total) by mouth 2 (two) times daily.      HYDROcodone-acetaminophen (NORCO) 10-325 MG tablet Take 1 tablet by mouth 4 (four) times daily as needed.     levothyroxine (SYNTHROID, LEVOTHROID) 50 MCG tablet Take 50 mcg by mouth daily before breakfast.     Multiple Vitamin (MULTIVITAMIN WITH MINERALS) TABS tablet Take 1 tablet by mouth daily.     PROAIR HFA 108 (90 Base) MCG/ACT inhaler Inhale 1 puff into the lungs every 6 (six) hours as needed for wheezing or shortness of breath.     rosuvastatin (CRESTOR) 10 MG tablet Take 10 mg by mouth daily.     Tiotropium Bromide Monohydrate (SPIRIVA RESPIMAT) 2.5 MCG/ACT AERS Inhale 2 puffs into the lungs daily. 4 g 0   traZODone (DESYREL) 50 MG tablet Take 50 mg by mouth at bedtime.     VITAMIN D PO Take 1 tablet by mouth daily. (Patient not taking: Reported on 06/04/2021)     No current facility-administered medications for this visit.    Allergies as of 08/20/2021 - Review Complete 06/18/2021  Allergen Reaction Noted   Codeine Other (See Comments) 01/18/2014    Family History  Problem Relation Age of Onset   Rheumatic fever Father    Alzheimer's disease Mother    Stroke Sister     Social History   Socioeconomic History   Marital status: Married    Spouse name: Not on file   Number of children: Not on file   Years of education: Not on file   Highest education level: Not on file  Occupational History   Not on file  Tobacco Use   Smoking status: Former    Packs/day: 0.25    Years: 55.00    Pack years: 13.75    Types: Cigarettes    Quit date: 12/12/2015    Years since quitting: 5.6   Smokeless tobacco: Never  Vaping Use   Vaping Use: Never used  Substance and Sexual Activity   Alcohol use: No   Drug use: No   Sexual activity: Not Currently  Other Topics Concern   Not on file  Social History Narrative   Not on file   Social Determinants of Health   Financial Resource Strain: Not on file  Food Insecurity: Not on file  Transportation Needs: Not on file  Physical  Activity: Not on file  Stress: Not on file  Social Connections: Not on file  Intimate Partner Violence: Not on file     Review of Systems   Gen: Denies any fever, chills, fatigue, weight loss, lack of appetite.  CV: Denies chest pain, heart palpitations, peripheral edema, syncope.  Resp: Denies shortness of breath at rest or with exertion. Denies wheezing or cough.  GI: Denies dysphagia or odynophagia. Denies jaundice, hematemesis, fecal incontinence. GU : Denies urinary burning, urinary frequency, urinary hesitancy MS: Denies joint pain, muscle  weakness, cramps, or limitation of movement.  Derm: Denies rash, itching, dry skin Psych: Denies depression, anxiety, memory loss, and confusion Heme: Denies bruising, bleeding, and enlarged lymph nodes.   Physical Exam   There were no vitals taken for this visit. General:   Alert and oriented. Pleasant and cooperative. Well-nourished and well-developed.  Head:  Normocephalic and atraumatic. Eyes:  Without icterus, sclera clear and conjunctiva pink.  Ears:  Normal auditory acuity. Mouth:  No deformity or lesions, oral mucosa pink.  Lungs:  Clear to auscultation bilaterally. No wheezes, rales, or rhonchi. No distress.  Heart:  S1, S2 present without murmurs appreciated.  Abdomen:  +BS, soft, non-tender and non-distended. No HSM noted. No guarding or rebound. No masses appreciated.  Rectal:  Deferred  Msk:  Symmetrical without gross deformities. Normal posture. Extremities:  Without edema. Neurologic:  Alert and  oriented x4;  grossly normal neurologically. Skin:  Intact without significant lesions or rashes. Psych:  Alert and cooperative. Normal mood and affect.   Assessment   David Stein is a 76 y.o. male with a history of COPD, pulmonary fibrosis, Atrial flutter on Eliquis, chronic diastolic heart failure and restless leg syndrome*** presenting today with need to schedule surveillance colonoscopy.    History of colon polyps:    PLAN   *** Proceed with colonoscopy with propofol by Dr. Gala Romney in near future: the risks, benefits, and alternatives have been discussed with the patient in detail. The patient states understanding and desires to proceed. Clearance to hold Eliquis for 2 days prior to procedure.   Venetia Night, MSN, FNP-BC, AGACNP-BC [facility]

## 2021-08-20 ENCOUNTER — Ambulatory Visit: Payer: Medicare Other | Admitting: Gastroenterology

## 2021-09-04 DIAGNOSIS — F4312 Post-traumatic stress disorder, chronic: Secondary | ICD-10-CM | POA: Diagnosis not present

## 2021-09-18 ENCOUNTER — Other Ambulatory Visit: Payer: Self-pay

## 2021-09-18 ENCOUNTER — Inpatient Hospital Stay (HOSPITAL_COMMUNITY)
Admission: EM | Admit: 2021-09-18 | Discharge: 2021-09-20 | DRG: 190 | Disposition: A | Discharge status: Home Health | Payer: Medicare Other | Attending: Family Medicine | Admitting: Family Medicine

## 2021-09-18 ENCOUNTER — Encounter (HOSPITAL_COMMUNITY): Payer: Self-pay

## 2021-09-18 ENCOUNTER — Emergency Department (HOSPITAL_COMMUNITY): Payer: Medicare Other

## 2021-09-18 DIAGNOSIS — Z7901 Long term (current) use of anticoagulants: Secondary | ICD-10-CM

## 2021-09-18 DIAGNOSIS — R9431 Abnormal electrocardiogram [ECG] [EKG]: Secondary | ICD-10-CM | POA: Diagnosis not present

## 2021-09-18 DIAGNOSIS — Z87891 Personal history of nicotine dependence: Secondary | ICD-10-CM | POA: Diagnosis not present

## 2021-09-18 DIAGNOSIS — G2581 Restless legs syndrome: Secondary | ICD-10-CM | POA: Diagnosis present

## 2021-09-18 DIAGNOSIS — Z9981 Dependence on supplemental oxygen: Secondary | ICD-10-CM | POA: Diagnosis not present

## 2021-09-18 DIAGNOSIS — J441 Chronic obstructive pulmonary disease with (acute) exacerbation: Secondary | ICD-10-CM | POA: Diagnosis not present

## 2021-09-18 DIAGNOSIS — D72829 Elevated white blood cell count, unspecified: Secondary | ICD-10-CM | POA: Diagnosis not present

## 2021-09-18 DIAGNOSIS — E039 Hypothyroidism, unspecified: Secondary | ICD-10-CM | POA: Diagnosis present

## 2021-09-18 DIAGNOSIS — R0602 Shortness of breath: Secondary | ICD-10-CM | POA: Diagnosis not present

## 2021-09-18 DIAGNOSIS — J9621 Acute and chronic respiratory failure with hypoxia: Secondary | ICD-10-CM | POA: Diagnosis not present

## 2021-09-18 DIAGNOSIS — I4510 Unspecified right bundle-branch block: Secondary | ICD-10-CM | POA: Diagnosis not present

## 2021-09-18 DIAGNOSIS — R531 Weakness: Secondary | ICD-10-CM | POA: Diagnosis not present

## 2021-09-18 DIAGNOSIS — Z7951 Long term (current) use of inhaled steroids: Secondary | ICD-10-CM

## 2021-09-18 DIAGNOSIS — Z79890 Hormone replacement therapy (postmenopausal): Secondary | ICD-10-CM | POA: Diagnosis not present

## 2021-09-18 DIAGNOSIS — Z820 Family history of epilepsy and other diseases of the nervous system: Secondary | ICD-10-CM

## 2021-09-18 DIAGNOSIS — I4892 Unspecified atrial flutter: Secondary | ICD-10-CM | POA: Diagnosis present

## 2021-09-18 DIAGNOSIS — Z20822 Contact with and (suspected) exposure to covid-19: Secondary | ICD-10-CM | POA: Diagnosis not present

## 2021-09-18 DIAGNOSIS — Z79899 Other long term (current) drug therapy: Secondary | ICD-10-CM

## 2021-09-18 DIAGNOSIS — Z823 Family history of stroke: Secondary | ICD-10-CM | POA: Diagnosis not present

## 2021-09-18 LAB — URINALYSIS, ROUTINE W REFLEX MICROSCOPIC
Bacteria, UA: NONE SEEN
Bilirubin Urine: NEGATIVE
Glucose, UA: NEGATIVE mg/dL
Hgb urine dipstick: NEGATIVE
Ketones, ur: 5 mg/dL — AB
Leukocytes,Ua: NEGATIVE
Nitrite: NEGATIVE
Protein, ur: 30 mg/dL — AB
Specific Gravity, Urine: 1.023 (ref 1.005–1.030)
pH: 5 (ref 5.0–8.0)

## 2021-09-18 LAB — CBC WITH DIFFERENTIAL/PLATELET
Abs Immature Granulocytes: 0.1 10*3/uL — ABNORMAL HIGH (ref 0.00–0.07)
Basophils Absolute: 0.1 10*3/uL (ref 0.0–0.1)
Basophils Relative: 0 %
Eosinophils Absolute: 0 10*3/uL (ref 0.0–0.5)
Eosinophils Relative: 0 %
HCT: 39 % (ref 39.0–52.0)
Hemoglobin: 12 g/dL — ABNORMAL LOW (ref 13.0–17.0)
Immature Granulocytes: 1 %
Lymphocytes Relative: 4 %
Lymphs Abs: 0.7 10*3/uL (ref 0.7–4.0)
MCH: 31.3 pg (ref 26.0–34.0)
MCHC: 30.8 g/dL (ref 30.0–36.0)
MCV: 101.6 fL — ABNORMAL HIGH (ref 80.0–100.0)
Monocytes Absolute: 1.8 10*3/uL — ABNORMAL HIGH (ref 0.1–1.0)
Monocytes Relative: 10 %
Neutro Abs: 15.6 10*3/uL — ABNORMAL HIGH (ref 1.7–7.7)
Neutrophils Relative %: 85 %
Platelets: 194 10*3/uL (ref 150–400)
RBC: 3.84 MIL/uL — ABNORMAL LOW (ref 4.22–5.81)
RDW: 13.7 % (ref 11.5–15.5)
WBC: 18.3 10*3/uL — ABNORMAL HIGH (ref 4.0–10.5)
nRBC: 0 % (ref 0.0–0.2)

## 2021-09-18 LAB — COMPREHENSIVE METABOLIC PANEL
ALT: 12 U/L (ref 0–44)
AST: 14 U/L — ABNORMAL LOW (ref 15–41)
Albumin: 3.5 g/dL (ref 3.5–5.0)
Alkaline Phosphatase: 84 U/L (ref 38–126)
Anion gap: 6 (ref 5–15)
BUN: 13 mg/dL (ref 8–23)
CO2: 38 mmol/L — ABNORMAL HIGH (ref 22–32)
Calcium: 8.8 mg/dL — ABNORMAL LOW (ref 8.9–10.3)
Chloride: 91 mmol/L — ABNORMAL LOW (ref 98–111)
Creatinine, Ser: 0.69 mg/dL (ref 0.61–1.24)
GFR, Estimated: >60 mL/min (ref >60)
Glucose, Bld: 149 mg/dL — ABNORMAL HIGH (ref 70–99)
Potassium: 3.9 mmol/L (ref 3.5–5.1)
Sodium: 135 mmol/L (ref 135–145)
Total Bilirubin: 0.6 mg/dL (ref 0.3–1.2)
Total Protein: 7.5 g/dL (ref 6.5–8.1)

## 2021-09-18 LAB — LACTIC ACID, PLASMA
Lactic Acid, Venous: 1.1 mmol/L (ref 0.5–1.9)
Lactic Acid, Venous: 0.8 mmol/L (ref 0.5–1.9)

## 2021-09-18 LAB — RESP PANEL BY RT-PCR (FLU A&B, COVID) ARPGX2
Influenza A by PCR: NEGATIVE
Influenza B by PCR: NEGATIVE
SARS Coronavirus 2 by RT PCR: NEGATIVE

## 2021-09-18 LAB — BRAIN NATRIURETIC PEPTIDE: B Natriuretic Peptide: 99 pg/mL (ref 0.0–100.0)

## 2021-09-18 LAB — PROTIME-INR
INR: 1.5 — ABNORMAL HIGH (ref 0.8–1.2)
Prothrombin Time: 17.5 seconds — ABNORMAL HIGH (ref 11.4–15.2)

## 2021-09-18 LAB — APTT: aPTT: 33 seconds (ref 24–36)

## 2021-09-18 MED ORDER — ALPRAZOLAM 0.5 MG PO TABS: 0.5000 mg | ORAL_TABLET | Freq: Three times a day (TID) | ORAL | Status: DC | PRN

## 2021-09-18 MED ORDER — AMIODARONE HCL 200 MG PO TABS
100.0000 mg | ORAL_TABLET | Freq: Every day | ORAL | Status: DC
  Administered 2021-09-19 – 2021-09-20 (×2): 100 mg via ORAL
  Filled 2021-09-18 (×2): qty 1

## 2021-09-18 MED ORDER — TRAZODONE HCL 50 MG PO TABS
50.0000 mg | ORAL_TABLET | Freq: Every day | ORAL | Status: DC
  Administered 2021-09-18 – 2021-09-19 (×2): 50 mg via ORAL
  Filled 2021-09-18 (×2): qty 1

## 2021-09-18 MED ORDER — ALBUTEROL SULFATE (2.5 MG/3ML) 0.083% IN NEBU: 2.5000 mg | INHALATION_SOLUTION | RESPIRATORY_TRACT | Status: DC | PRN

## 2021-09-18 MED ORDER — MAGNESIUM SULFATE 2 GM/50ML IV SOLN
2.0000 g | Freq: Once | INTRAVENOUS | Status: AC
  Administered 2021-09-18: 2 g via INTRAVENOUS
  Filled 2021-09-18: qty 50

## 2021-09-18 MED ORDER — IPRATROPIUM-ALBUTEROL 0.5-2.5 (3) MG/3ML IN SOLN
3.0000 mL | Freq: Once | RESPIRATORY_TRACT | Status: AC
  Administered 2021-09-18: 3 mL via RESPIRATORY_TRACT
  Filled 2021-09-18: qty 3

## 2021-09-18 MED ORDER — ACETAMINOPHEN 325 MG PO TABS: 650.0000 mg | ORAL_TABLET | Freq: Four times a day (QID) | ORAL | Status: DC | PRN

## 2021-09-18 MED ORDER — SODIUM CHLORIDE 0.9% FLUSH: 3.0000 mL | INTRAVENOUS | Status: DC | PRN

## 2021-09-18 MED ORDER — IPRATROPIUM-ALBUTEROL 0.5-2.5 (3) MG/3ML IN SOLN
3.0000 mL | Freq: Four times a day (QID) | RESPIRATORY_TRACT | Status: DC
  Administered 2021-09-18 – 2021-09-19 (×4): 3 mL via RESPIRATORY_TRACT
  Filled 2021-09-18 (×4): qty 3

## 2021-09-18 MED ORDER — METHYLPREDNISOLONE SODIUM SUCC 40 MG IJ SOLR
40.0000 mg | Freq: Two times a day (BID) | INTRAMUSCULAR | Status: DC
  Administered 2021-09-18 – 2021-09-20 (×4): 40 mg via INTRAVENOUS
  Filled 2021-09-18 (×4): qty 1

## 2021-09-18 MED ORDER — DILTIAZEM HCL ER COATED BEADS 240 MG PO CP24
240.0000 mg | ORAL_CAPSULE | Freq: Every day | ORAL | Status: DC
  Administered 2021-09-19 – 2021-09-20 (×2): 240 mg via ORAL
  Filled 2021-09-18 (×2): qty 1

## 2021-09-18 MED ORDER — ROSUVASTATIN CALCIUM 20 MG PO TABS
10.0000 mg | ORAL_TABLET | Freq: Every day | ORAL | Status: DC
  Administered 2021-09-18 – 2021-09-20 (×3): 10 mg via ORAL
  Filled 2021-09-18 (×3): qty 1

## 2021-09-18 MED ORDER — SODIUM CHLORIDE 0.9 % IV SOLN
INTRAVENOUS | Status: DC | PRN
Start: 2021-09-18 — End: 2021-09-20

## 2021-09-18 MED ORDER — SODIUM CHLORIDE 0.9 % IV SOLN
500.0000 mg | Freq: Once | INTRAVENOUS | Status: AC
  Administered 2021-09-18: 500 mg via INTRAVENOUS
  Filled 2021-09-18: qty 5

## 2021-09-18 MED ORDER — LEVOTHYROXINE SODIUM 50 MCG PO TABS
50.0000 ug | ORAL_TABLET | Freq: Every day | ORAL | Status: DC
  Administered 2021-09-20: 50 ug via ORAL
  Filled 2021-09-18: qty 1

## 2021-09-18 MED ORDER — SODIUM CHLORIDE 0.9% FLUSH
3.0000 mL | Freq: Two times a day (BID) | INTRAVENOUS | Status: DC
  Administered 2021-09-18 – 2021-09-19 (×3): 3 mL via INTRAVENOUS

## 2021-09-18 MED ORDER — SODIUM CHLORIDE 0.9 % IV SOLN
2.0000 g | Freq: Once | INTRAVENOUS | Status: AC
  Administered 2021-09-18: 2 g via INTRAVENOUS
  Filled 2021-09-18: qty 20

## 2021-09-18 MED ORDER — ALBUTEROL SULFATE (2.5 MG/3ML) 0.083% IN NEBU
2.5000 mg | INHALATION_SOLUTION | Freq: Once | RESPIRATORY_TRACT | Status: AC
  Administered 2021-09-18: 2.5 mg via RESPIRATORY_TRACT
  Filled 2021-09-18: qty 3

## 2021-09-18 MED ORDER — ONDANSETRON HCL 4 MG/2ML IJ SOLN: 4.0000 mg | Freq: Four times a day (QID) | INTRAMUSCULAR | Status: DC | PRN

## 2021-09-18 MED ORDER — ADULT MULTIVITAMIN W/MINERALS CH
1.0000 | ORAL_TABLET | Freq: Every day | ORAL | Status: DC
Start: 2021-09-19 — End: 2021-09-20
  Administered 2021-09-19 – 2021-09-20 (×2): 1 via ORAL
  Filled 2021-09-18 (×2): qty 1

## 2021-09-18 MED ORDER — ACETAMINOPHEN 650 MG RE SUPP: 650.0000 mg | Freq: Four times a day (QID) | RECTAL | Status: DC | PRN

## 2021-09-18 MED ORDER — DOXYCYCLINE HYCLATE 100 MG PO TABS
100.0000 mg | ORAL_TABLET | Freq: Two times a day (BID) | ORAL | Status: DC
Start: 2021-09-18 — End: 2021-09-20
  Administered 2021-09-18 – 2021-09-20 (×4): 100 mg via ORAL
  Filled 2021-09-18 (×4): qty 1

## 2021-09-18 MED ORDER — MIRTAZAPINE 15 MG PO TABS
15.0000 mg | ORAL_TABLET | Freq: Every day | ORAL | Status: DC
  Administered 2021-09-18 – 2021-09-19 (×2): 15 mg via ORAL
  Filled 2021-09-18 (×2): qty 1

## 2021-09-18 MED ORDER — MOMETASONE FURO-FORMOTEROL FUM 200-5 MCG/ACT IN AERO
2.0000 | INHALATION_SPRAY | Freq: Two times a day (BID) | RESPIRATORY_TRACT | Status: DC
  Administered 2021-09-18 – 2021-09-20 (×4): 2 via RESPIRATORY_TRACT
  Filled 2021-09-18: qty 8.8

## 2021-09-18 MED ORDER — VITAMIN D 25 MCG (1000 UNIT) PO TABS
2000.0000 [IU] | ORAL_TABLET | Freq: Every day | ORAL | Status: DC
  Administered 2021-09-19 – 2021-09-20 (×2): 2000 [IU] via ORAL
  Filled 2021-09-18 (×2): qty 2

## 2021-09-18 MED ORDER — SODIUM CHLORIDE 0.9% FLUSH
3.0000 mL | Freq: Two times a day (BID) | INTRAVENOUS | Status: DC
Start: 2021-09-18 — End: 2021-09-20
  Administered 2021-09-18 – 2021-09-19 (×3): 3 mL via INTRAVENOUS

## 2021-09-18 MED ORDER — BISACODYL 10 MG RE SUPP: 10.0000 mg | Freq: Every day | RECTAL | Status: DC | PRN

## 2021-09-18 MED ORDER — METHYLPREDNISOLONE SODIUM SUCC 125 MG IJ SOLR
125.0000 mg | Freq: Once | INTRAMUSCULAR | Status: AC
  Administered 2021-09-18: 125 mg via INTRAVENOUS
  Filled 2021-09-18: qty 2

## 2021-09-18 MED ORDER — POLYETHYLENE GLYCOL 3350 17 G PO PACK
17.0000 g | PACK | Freq: Every day | ORAL | Status: DC | PRN
  Administered 2021-09-19 – 2021-09-20 (×2): 17 g via ORAL
  Filled 2021-09-18 (×2): qty 1

## 2021-09-18 MED ORDER — APIXABAN 5 MG PO TABS
5.0000 mg | ORAL_TABLET | Freq: Two times a day (BID) | ORAL | Status: DC
  Administered 2021-09-18 – 2021-09-20 (×4): 5 mg via ORAL
  Filled 2021-09-18 (×4): qty 1

## 2021-09-18 MED ORDER — GUAIFENESIN ER 600 MG PO TB12
600.0000 mg | ORAL_TABLET | Freq: Two times a day (BID) | ORAL | Status: DC
  Administered 2021-09-18 – 2021-09-20 (×4): 600 mg via ORAL
  Filled 2021-09-18 (×4): qty 1

## 2021-09-18 MED ORDER — ONDANSETRON HCL 4 MG PO TABS: 4.0000 mg | ORAL_TABLET | Freq: Four times a day (QID) | ORAL | Status: DC | PRN

## 2021-09-18 NOTE — Sepsis Progress Note (Signed)
Notified bedside nurse of need to administer antibiotics.  

## 2021-09-18 NOTE — ED Notes (Signed)
Pt put in gown and on 12 lead

## 2021-09-18 NOTE — ED Notes (Signed)
Respiratory contacted for nebulizers.

## 2021-09-18 NOTE — ED Provider Notes (Signed)
[facility] EMERGENCY DEPARTMENT Provider Note   CSN: [id number] Arrival date & time: 09/18/21  1004     History  Chief Complaint  Patient presents with   Shortness of David Stein is a 76 y.o. male.  Patient complains of cough and fever and shortness of breath.  He is normally on 3 L nasal at home for COPD   Shortness of Breath      Home Medications Prior to Admission medications   Medication Sig Start Date End Date Taking? Authorizing Provider  acetaminophen (TYLENOL) 500 MG tablet Take 500 mg by mouth every 6 (six) hours as needed.    [provider]  albuterol (PROVENTIL) (2.5 MG/3ML) 0.083% nebulizer solution Take 3 mLs (2.5 mg total) by nebulization every 4 (four) hours as needed for wheezing or shortness of breath. 10/31/20   Tanda Rockers, MD  ALPRAZolam Duanne Moron) 0.5 MG tablet Take 0.5 mg by mouth every 8 (eight) hours as needed for anxiety. 06/11/20   [provider]  amiodarone (PACERONE) 200 MG tablet Take 0.5 tablets (100 mg total) by mouth daily. 08/22/20   Evans Lance, MD  antiseptic oral rinse (BIOTENE) LIQD 15 mLs by Mouth Rinse route as needed for dry mouth.    [provider]  apixaban (ELIQUIS) 5 MG TABS tablet Take 1 tablet (5 mg total) by mouth 2 (two) times daily. 12/14/15   Laqueta Linden, MD  azithromycin (ZITHROMAX) 250 MG tablet TAKE 2 TABLETS TODAY, THEN 1 TABLET DAILY FOR 4 DAYS. 03/06/21   Tanda Rockers, MD  budesonide-formoterol (SYMBICORT) 160-4.5 MCG/ACT inhaler Inhale 2 puffs into the lungs 2 (two) times daily.    [provider]  clotrimazole-betamethasone (LOTRISONE) cream Apply 1 application topically daily. 06/11/20   [provider]  diltiazem (CARDIZEM CD) 240 MG 24 hr capsule TAKE (1) CAPSULE BY MOUTH EVERY DAY. 01/09/21   Arnoldo Lenis, MD  Elastic Bandages & Supports (M-4 KNEE HIGH STOCKINGS) MISC 1 each by Does not apply route as directed. 08/02/20   Arnoldo Lenis,  MD  ferrous sulfate 325 (65 FE) MG tablet Take 325 mg by mouth daily with breakfast.    [provider]  fluocinonide cream (LIDEX) 0.05 % Apply topically 3 (three) times daily as needed. 06/11/20   [provider]  furosemide (LASIX) 20 MG tablet Take 20 mg by mouth as needed.    [provider]  guaiFENesin (MUCINEX) 600 MG 12 hr tablet Take 1 tablet (600 mg total) by mouth 2 (two) times daily. 12/14/15   Laqueta Linden, MD  HYDROcodone-acetaminophen (NORCO) 10-325 MG tablet Take 1 tablet by mouth 4 (four) times daily as needed. 07/19/20   [provider]  levothyroxine (SYNTHROID, LEVOTHROID) 50 MCG tablet Take 50 mcg by mouth daily before breakfast.    [provider]  Multiple Vitamin (MULTIVITAMIN WITH MINERALS) TABS tablet Take 1 tablet by mouth daily.    [provider]  PROAIR HFA 108 (90 Base) MCG/ACT inhaler Inhale 1 puff into the lungs every 6 (six) hours as needed for wheezing or shortness of breath. 12/06/15   [provider]  rosuvastatin (CRESTOR) 10 MG tablet Take 10 mg by mouth daily. 06/12/20   [provider]  Tiotropium Bromide Monohydrate (SPIRIVA RESPIMAT) 2.5 MCG/ACT AERS Inhale 2 puffs into the lungs daily. 08/24/19   Tanda Rockers, MD  traZODone (DESYREL) 50 MG tablet Take 50 mg by mouth at bedtime.  [provider]  VITAMIN D PO Take 1 tablet by mouth daily. Patient not taking: Reported on 06/04/2021    [provider]      Allergies    Codeine    Review of Systems   Review of Systems  Respiratory:  Positive for shortness of breath.     Physical Exam Updated Vital Signs BP 122/63   Pulse 86   Temp 98.4 F (36.9 C) (Oral)   Resp 17   Ht '6\' 2"'$  (1.88 m)   Wt 88 kg   SpO2 93%   BMI 24.91 kg/m  Physical Exam  ED Results / Procedures / Treatments   Labs (all labs ordered are listed, but only abnormal results are displayed) Labs Reviewed  COMPREHENSIVE METABOLIC  PANEL - Abnormal; Notable for the following components:      Result Value   Chloride 91 (*)    CO2 38 (*)    Glucose, Bld 149 (*)    Calcium 8.8 (*)    AST 14 (*)    All other components within normal limits  CBC WITH DIFFERENTIAL/PLATELET - Abnormal; Notable for the following components:   WBC 18.3 (*)    RBC 3.84 (*)    Hemoglobin 12.0 (*)    MCV 101.6 (*)    Neutro Abs 15.6 (*)    Monocytes Absolute 1.8 (*)    Abs Immature Granulocytes 0.10 (*)    All other components within normal limits  PROTIME-INR - Abnormal; Notable for the following components:   Prothrombin Time 17.5 (*)    INR 1.5 (*)    All other components within normal limits  RESP PANEL BY RT-PCR (FLU A&B, COVID) [id number]  CULTURE, BLOOD (ROUTINE X 2)  CULTURE, BLOOD (ROUTINE X 2)  URINE CULTURE  LACTIC ACID, PLASMA  APTT  BRAIN NATRIURETIC PEPTIDE  LACTIC ACID, PLASMA  URINALYSIS, ROUTINE W REFLEX MICROSCOPIC    EKG None  Radiology DG Chest Port 1 View  Result Date: 09/18/2021 CLINICAL DATA:  Shortness of breath.  On home oxygen.  Ex-smoker. EXAM: PORTABLE CHEST 1 VIEW COMPARISON:  02/13/2021 FINDINGS: Numerous leads and wires project over the chest. Midline trachea. Normal heart size. Atherosclerosis in the transverse aorta. No pleural effusion or pneumothorax. Emphysema, as evidenced by lucency in the upper lungs. Persistent lower lung predominant interstitial thickening may be increased. No lobar consolidation. Mild bibasilar scarring. IMPRESSION: COPD/chronic bronchitis.  No acute superimposed process. Aortic Atherosclerosis (ICD10-I70.0). Electronically Signed   By: Abigail Miyamoto M.D.   On: 09/18/2021 10:45    Procedures Procedures    Medications Ordered in ED Medications  cefTRIAXone (ROCEPHIN) 2 g in sodium chloride 0.9 % 100 mL IVPB (2 g Intravenous New Bag/Given 09/18/21 1120)  azithromycin (ZITHROMAX) 500 mg in sodium chloride 0.9 % 250 mL IVPB (500 mg Intravenous New Bag/Given 09/18/21 1126)   methylPREDNISolone sodium succinate (SOLU-MEDROL) 125 mg/2 mL injection 125 mg (125 mg Intravenous Given 09/18/21 1115)  ipratropium-albuterol (DUONEB) 0.5-2.5 (3) MG/3ML nebulizer solution 3 mL (3 mLs Nebulization Given 09/18/21 1146)  albuterol (PROVENTIL) (2.5 MG/3ML) 0.083% nebulizer solution 2.5 mg (2.5 mg Nebulization Given 09/18/21 1146)  magnesium sulfate IVPB 2 g 50 mL (2 g Intravenous New Bag/Given 09/18/21 1122)    ED Course/ Medical Decision Making/ A&P                           Medical Decision Making Amount and/or Complexity of Data Reviewed Labs: ordered. Radiology: ordered.  ECG/medicine tests: ordered.  Risk Prescription drug management. Decision regarding hospitalization.   Patient with COPD D exacerbation and bronchitis or pneumonia.  He will be admitted to medicine        Final Clinical Impression(s) / ED Diagnoses Final diagnoses:  COPD exacerbation [facility])    Rx / DC Orders ED Discharge Orders     None         Milton Ferguson, MD 09/18/21 1317

## 2021-09-18 NOTE — ED Triage Notes (Addendum)
Patient via EMS due to shortness of breath that has been getting worse over the past couple days. Patient does use home oxygen @ 3LPM [place]. Does have non productive cough and fever.

## 2021-09-18 NOTE — H&P (Signed)
Patient Demographics:    David Stein, is a 76 y.o. male  MRN: [id number]   DOB - January 09, 1946  Admit Date - 09/18/2021  Outpatient Primary MD for the patient is Redmond School, MD   Assessment & Plan:   Assessment and Plan:  1)Acute COPD Exacerbation- no definite pneumonia,  treat empirically with IV Solu-Medrol 40 mg every 12 hours, give mucolytics, Rocephin and bronchodilators as ordered, supplemental oxygen as ordered.    2) leukocytosis may be reactive--cultures pending, Rocephin as above --UA not suspicious for UTI  3) acute on chronic hypoxic respiratory failure--- due to #1 above -Baseline O2 requirement is 3 L currently requiring up to 5 L of oxygen via nasal cannula  4) atrial flutter--- amiodarone and Cardizem for rate control, Eliquis for stroke prophylaxis  5) hypothyroidism--- levothyroxine  Disposition/Need for in-Hospital Stay- patient unable to be discharged at this time due to --- acute COPD exacerbation with acute on chronic hypoxic respiratory failure requiring antibiotics, steroids bronchodilators and increase oxygen supplementation*  Status is: Inpatient  Remains inpatient appropriate because:   Dispo: The patient is from: Home              Anticipated d/c is to: Home              Anticipated d/c date is: 2 days              Patient currently is not medically stable to d/c. 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 Gershon Crane, MD;  Location: [facility];  Service: Ophthalmology;  Laterality: Right;   CATARACT EXTRACTION W/PHACO Left 02/14/2014   Procedure: CATARACT EXTRACTION PHACO AND INTRAOCULAR LENS PLACEMENT; CDE:  8.34;  Surgeon: Elta Guadeloupe T. Gershon Crane, MD;  Location: [facility];  Service: Ophthalmology;  Laterality: Left;   KNEE SURGERY Left       Chief Complaint  Patient presents with   Shortness of Breath      HPI:    David Stein  is a 76 y.o. male with past medical history relevant for COPD, atrial flutter, chronic hypoxic respiratory failure currently on 3 L chronically at home presents to the ED with worsening shortness of breath, fevers, productive cough and wheezing for the last couple of days worse over the last 24 hours --no chest pains no palpitation -No leg pains no leg swelling no pleuritic symptoms -Chest x-Jasdeep without acute cardiopulmonary finding -WBC 18.3 Creatinine  0.69 -COVID and flu negative -BNP 99  Review of systems:    In addition to the HPI above,   A full Review of  Systems was done, all other systems reviewed are negative except as noted above in HPI , .    Social History:  Reviewed by me    Social History   Tobacco Use   Smoking status: Former    Packs/day: 0.25    Years: 55.00    Total pack years: 13.75    Types: Cigarettes    Quit date: 12/12/2015    Years since quitting: 5.7   Smokeless tobacco: Never  Substance Use Topics   Alcohol use: No       Family History :  Reviewed by me    Family History  Problem Relation Age of Onset   Rheumatic fever Father    Alzheimer's disease Mother    Stroke Sister      Home Medications:   Prior to Admission medications   Medication Sig Start Date End Date Taking? Authorizing Provider  acetaminophen (TYLENOL) 500 MG tablet Take 500 mg by mouth every 6 (six) hours as needed for mild pain.   Yes [provider]  albuterol (PROVENTIL) (2.5 MG/3ML) 0.083% nebulizer solution Take 3 mLs (2.5 mg total) by nebulization every 4 (four) hours as needed for wheezing or shortness of breath. 10/31/20  Yes Tanda Rockers, MD  ALPRAZolam Duanne Moron) 0.5 MG tablet Take 0.5 mg by mouth every 8 (eight) hours as needed for anxiety. 06/11/20  Yes [provider]  amiodarone (PACERONE) 200 MG tablet Take 0.5 tablets (100 mg total) by mouth daily. 08/22/20  Yes Evans Lance, MD  antiseptic oral rinse (BIOTENE) LIQD 15 mLs by Mouth Rinse route as needed for dry mouth.   Yes [provider]  apixaban (ELIQUIS) 5 MG TABS tablet Take 1 tablet (5 mg total) by mouth 2 (two) times daily. 12/14/15  Yes Laqueta Linden, MD  budesonide-formoterol [facility]) 160-4.5 MCG/ACT inhaler Inhale 2 puffs into the lungs 2 (two) times daily.   Yes [provider]  Cholecalciferol (D3 2000) 50 MCG (2000 UT) CAPS Take 1 capsule by mouth daily.   Yes [provider]  diltiazem (CARDIZEM CD) 240 MG 24 hr capsule TAKE (1) CAPSULE BY MOUTH EVERY DAY. Patient taking differently: Take 240 mg by mouth daily. 01/09/21  Yes Branch, Alphonse Guild, MD  fluticasone (FLONASE) 50 MCG/ACT nasal spray Place 1 spray into both nostrils daily.   Yes [provider]  furosemide (LASIX) 20 MG tablet Take 20 mg by mouth daily.   Yes [provider]  guaiFENesin (MUCINEX) 600 MG 12 hr tablet Take 1 tablet (600 mg total) by mouth 2 (two) times daily. 12/14/15  Yes Laqueta Linden, MD  levothyroxine (SYNTHROID, LEVOTHROID) 50 MCG tablet Take 50 mcg by mouth daily before breakfast.   Yes [provider]  mirtazapine (REMERON) 15 MG tablet Take 15 mg by mouth at bedtime. 06/25/21  Yes [provider]  Multiple Vitamin (MULTIVITAMIN WITH MINERALS) TABS tablet Take 1 tablet by mouth daily.   Yes [provider]  OXYGEN Inhale 3 L into the lungs daily.   Yes [provider]   predniSONE (DELTASONE) 10 MG tablet Take 10 mg by mouth daily as needed (COPD). 08/30/21  Yes [provider]  PROAIR HFA 108 (90 Base) MCG/ACT inhaler Inhale 1 puff into the lungs every 6 (six) hours as needed for wheezing or shortness of  breath. 12/06/15  Yes [provider]  rosuvastatin (CRESTOR) 10 MG tablet Take 10 mg by mouth daily. 06/12/20  Yes [provider]  Tiotropium Bromide Monohydrate (SPIRIVA RESPIMAT) 2.5 MCG/ACT AERS Inhale 2 puffs into the lungs daily. 08/24/19  Yes Tanda Rockers, MD  traZODone (DESYREL) 50 MG tablet Take 50 mg by mouth at bedtime.   Yes [provider]  Elastic Bandages & Supports (M-4 KNEE HIGH STOCKINGS) MISC 1 each by Does not apply route as directed. 08/02/20   Arnoldo Lenis, MD     Allergies:     Allergies  Allergen Reactions   Codeine Other (See Comments)    headache     Physical Exam:   Vitals  Blood pressure (!) 141/63, pulse 91, temperature 98.7 F (37.1 C), resp. rate 19, height '6\' 2"'$  (1.88 m), weight 88 kg, SpO2 92 %.  Physical Examination: General appearance - alert, some conversational dyspnea Mental status - alert, oriented to person place, and time,  Eyes - sclera anicteric Nose- [place] 5L/min Neck - supple, no JVD elevation , Chest -diminished breath sounds with scattered wheezes  heart - S1 and S2 normal, irregular  abdomen - soft, nontender, nondistended, +BS Neurological - screening mental status exam normal, neck supple without rigidity, cranial nerves II through XII intact, DTR's normal and symmetric Extremities - no pedal edema noted, intact peripheral pulses  Skin - warm, dry     Data Review:    CBC Recent Labs  Lab 09/18/21 1036  WBC 18.3*  HGB 12.0*  HCT 39.0  PLT 194  MCV 101.6*  MCH 31.3  MCHC 30.8  RDW 13.7  LYMPHSABS 0.7  MONOABS 1.8*  EOSABS 0.0  BASOSABS 0.1    ------------------------------------------------------------------------------------------------------------------  Chemistries  Recent Labs  Lab 09/18/21 1036  NA 135  K 3.9  CL 91*  CO2 38*  GLUCOSE 149*  BUN 13  CREATININE 0.69  CALCIUM 8.8*  AST 14*  ALT 12  ALKPHOS 84  BILITOT 0.6   ------------------------------------------------------------------------------------------------------------------ estimated creatinine clearance is 91.3 mL/min (by C-G formula based on SCr of 0.69 mg/dL). ------------------------------------------------------------------------------------------------------------------ No results for input(s): "TSH", "T4TOTAL", "T3FREE", "THYROIDAB" in the last 72 hours.  Invalid input(s): "FREET3"   Coagulation profile Recent Labs  Lab 09/18/21 1036  INR 1.5*   ------------------------------------------------------------------------------------------------------------------- No results for input(s): "DDIMER" in the last 72 hours. -------------------------------------------------------------------------------------------------------------------  Cardiac Enzymes No results for input(s): "CKMB", "TROPONINI", "MYOGLOBIN" in the last 168 hours.  Invalid input(s): "CK" ------------------------------------------------------------------------------------------------------------------    Component Value Date/Time   BNP 99.0 09/18/2021 1036     ---------------------------------------------------------------------------------------------------------------  Urinalysis    Component Value Date/Time   COLORURINE YELLOW 09/18/2021 1036   APPEARANCEUR CLEAR 09/18/2021 1036   LABSPEC 1.023 09/18/2021 1036   PHURINE 5.0 09/18/2021 1036   GLUCOSEU NEGATIVE 09/18/2021 1036   HGBUR NEGATIVE 09/18/2021 1036   BILIRUBINUR NEGATIVE 09/18/2021 1036   KETONESUR 5 (A) 09/18/2021 1036   PROTEINUR 30 (A) 09/18/2021 1036   NITRITE NEGATIVE 09/18/2021 1036    LEUKOCYTESUR NEGATIVE 09/18/2021 1036    ----------------------------------------------------------------------------------------------------------------   Imaging Results:    DG Chest Port 1 View  Result Date: 09/18/2021 CLINICAL DATA:  Shortness of breath.  On home oxygen.  Ex-smoker. EXAM: PORTABLE CHEST 1 VIEW COMPARISON:  02/13/2021 FINDINGS: Numerous leads and wires project over the chest. Midline trachea. Normal heart size. Atherosclerosis in the transverse aorta. No pleural effusion or pneumothorax. Emphysema, as evidenced by lucency in the upper lungs. Persistent lower lung predominant interstitial thickening  may be increased. No lobar consolidation. Mild bibasilar scarring. IMPRESSION: COPD/chronic bronchitis.  No acute superimposed process. Aortic Atherosclerosis (ICD10-I70.0). Electronically Signed   By: Abigail Miyamoto M.D.   On: 09/18/2021 10:45    Radiological Exams on Admission: DG Chest Port 1 View  Result Date: 09/18/2021 CLINICAL DATA:  Shortness of breath.  On home oxygen.  Ex-smoker. EXAM: PORTABLE CHEST 1 VIEW COMPARISON:  02/13/2021 FINDINGS: Numerous leads and wires project over the chest. Midline trachea. Normal heart size. Atherosclerosis in the transverse aorta. No pleural effusion or pneumothorax. Emphysema, as evidenced by lucency in the upper lungs. Persistent lower lung predominant interstitial thickening may be increased. No lobar consolidation. Mild bibasilar scarring. IMPRESSION: COPD/chronic bronchitis.  No acute superimposed process. Aortic Atherosclerosis (ICD10-I70.0). Electronically Signed   By: Abigail Miyamoto M.D.   On: 09/18/2021 10:45    DVT Prophylaxis -SCD /heparin AM Labs Ordered, also please review Full Orders  Family Communication: Admission, patients condition and plan of care including tests being ordered have been discussed with the patient who indicate understanding and agree with the plan   Condition  -stable   Roxan Hockey M.D on 09/18/2021 at  6:31 PM Go to [email or web address] -  for contact info  [facility] - Office  [phone]

## 2021-09-18 NOTE — Progress Notes (Signed)
Patient spo2 97% on 4lpm cann o2 decreased to 3lpm cann per patient home regimen

## 2021-09-18 NOTE — Sepsis Progress Note (Signed)
ELink tracking the Code Sepsis. 

## 2021-09-19 DIAGNOSIS — I4892 Unspecified atrial flutter: Secondary | ICD-10-CM | POA: Diagnosis present

## 2021-09-19 DIAGNOSIS — Z7901 Long term (current) use of anticoagulants: Secondary | ICD-10-CM | POA: Diagnosis not present

## 2021-09-19 DIAGNOSIS — J9621 Acute and chronic respiratory failure with hypoxia: Secondary | ICD-10-CM | POA: Diagnosis present

## 2021-09-19 DIAGNOSIS — Z9981 Dependence on supplemental oxygen: Secondary | ICD-10-CM | POA: Diagnosis not present

## 2021-09-19 DIAGNOSIS — G2581 Restless legs syndrome: Secondary | ICD-10-CM | POA: Diagnosis present

## 2021-09-19 DIAGNOSIS — Z820 Family history of epilepsy and other diseases of the nervous system: Secondary | ICD-10-CM | POA: Diagnosis not present

## 2021-09-19 DIAGNOSIS — J441 Chronic obstructive pulmonary disease with (acute) exacerbation: Secondary | ICD-10-CM | POA: Diagnosis present

## 2021-09-19 DIAGNOSIS — Z823 Family history of stroke: Secondary | ICD-10-CM | POA: Diagnosis not present

## 2021-09-19 DIAGNOSIS — D72829 Elevated white blood cell count, unspecified: Secondary | ICD-10-CM | POA: Diagnosis present

## 2021-09-19 DIAGNOSIS — Z79890 Hormone replacement therapy (postmenopausal): Secondary | ICD-10-CM | POA: Diagnosis not present

## 2021-09-19 DIAGNOSIS — Z79899 Other long term (current) drug therapy: Secondary | ICD-10-CM | POA: Diagnosis not present

## 2021-09-19 DIAGNOSIS — Z7951 Long term (current) use of inhaled steroids: Secondary | ICD-10-CM | POA: Diagnosis not present

## 2021-09-19 DIAGNOSIS — Z87891 Personal history of nicotine dependence: Secondary | ICD-10-CM | POA: Diagnosis not present

## 2021-09-19 DIAGNOSIS — Z20822 Contact with and (suspected) exposure to covid-19: Secondary | ICD-10-CM | POA: Diagnosis present

## 2021-09-19 DIAGNOSIS — E039 Hypothyroidism, unspecified: Secondary | ICD-10-CM | POA: Diagnosis present

## 2021-09-19 LAB — URINE CULTURE: Culture: NO GROWTH

## 2021-09-19 MED ORDER — IPRATROPIUM-ALBUTEROL 0.5-2.5 (3) MG/3ML IN SOLN
3.0000 mL | Freq: Three times a day (TID) | RESPIRATORY_TRACT | Status: DC
  Administered 2021-09-20: 3 mL via RESPIRATORY_TRACT
  Filled 2021-09-19 (×2): qty 3

## 2021-09-19 MED ORDER — ALBUTEROL SULFATE (2.5 MG/3ML) 0.083% IN NEBU: 2.5000 mg | INHALATION_SOLUTION | RESPIRATORY_TRACT | Status: DC | PRN

## 2021-09-19 NOTE — Progress Notes (Signed)
PROGRESS NOTE     David Stein, is a 76 y.o. male, DOB - December 27, 1945, ZOX:[id number]  Admit date - 09/18/2021   Admitting Physician Haley Fuerstenberg Denton Brick, MD  Outpatient Primary MD for the patient is Redmond School, MD  LOS - 0  Chief Complaint  Patient presents with   Shortness of Breath        Brief Narrative:   76 y.o. male with past medical history relevant for COPD, atrial flutter, chronic hypoxic respiratory failure currently on 3 L chronically at home admitted on 09/18/2021 with acute on chronic hypoxic respiratory failure due to COPD exacerbation/respiratory infection    -Assessment and Plan: 1)Acute COPD Exacerbation-cough dyspnea and wheezing improving -Continue IV Solu-Medrol 40 mg every 12 hours, c/n mucolytics, Rocephin/doxy and bronchodilators as ordered, supplemental oxygen as ordered.      2) leukocytosis may be reactive--cultures pending, Rocephin as above --UA not suspicious for UTI -Blood cultures NGTD   3) acute on chronic hypoxic respiratory failure--- due to #1 above -Hypoxia improving oxygen requirement trended back to baseline   4) paroxysmal atrial flutter--- amiodarone and Cardizem for rate control, Eliquis for stroke prophylaxis   5) hypothyroidism--- stable, continue levothyroxine   Disposition/Need for in-Hospital Stay- patient unable to be discharged at this time due to --- acute COPD exacerbation with acute on chronic hypoxic respiratory failure requiring antibiotics, steroids bronchodilators and increase oxygen supplementation -Possible discharge on 09/20/2021 if continues to improve   Status is: Inpatient   Remains inpatient appropriate because:    Dispo: The patient is from: Home              Anticipated d/c is to: Home              Anticipated d/c date is: 1 days              Patient currently is not medically stable to d/c. Barriers: Not Clinically Stable-   Code Status : -  Code Status: Full Code   Family Communication: Discussed with  wife at bedside  DVT Prophylaxis  :   - SCDs apixaban (ELIQUIS) tablet 5 mg   Lab Results  Component Value Date   PLT 194 09/18/2021    Inpatient Medications  Scheduled Meds:  amiodarone  100 mg Oral Daily   apixaban  5 mg Oral BID   cholecalciferol  2,000 Units Oral Daily   diltiazem  240 mg Oral Daily   doxycycline  100 mg Oral Q12H   guaiFENesin  600 mg Oral BID   ipratropium-albuterol  3 mL Nebulization Q6H   levothyroxine  50 mcg Oral QAC breakfast   methylPREDNISolone (SOLU-MEDROL) injection  40 mg Intravenous Q12H   mirtazapine  15 mg Oral QHS   mometasone-formoterol  2 puff Inhalation BID   multivitamin with minerals  1 tablet Oral Daily   rosuvastatin  10 mg Oral Daily   sodium chloride flush  3 mL Intravenous Q12H   sodium chloride flush  3 mL Intravenous Q12H   traZODone  50 mg Oral QHS   Continuous Infusions:  sodium chloride     PRN Meds:.sodium chloride, acetaminophen **OR** acetaminophen, albuterol, ALPRAZolam, bisacodyl, ondansetron **OR** ondansetron (ZOFRAN) IV, polyethylene glycol, sodium chloride flush   Anti-infectives (From admission, onward)    Start     Dose/Rate Route Frequency Ordered Stop   09/18/21 2200  doxycycline (VIBRA-TABS) tablet 100 mg        100 mg Oral Every 12 hours 09/18/21 1844     09/18/21 1045  cefTRIAXone (ROCEPHIN) 2 g in sodium chloride 0.9 % 100 mL IVPB        2 g 200 mL/hr over 30 Minutes Intravenous  Once 09/18/21 1036 09/18/21 1326   09/18/21 1045  azithromycin (ZITHROMAX) 500 mg in sodium chloride 0.9 % 250 mL IVPB        500 mg 250 mL/hr over 60 Minutes Intravenous  Once 09/18/21 1036 09/18/21 1226         Subjective: Onnie Boer today has no fevers, no emesis,  No chest pain,   -Patient's wife at bedside - Cough, dyspnea and wheezing improving slowly -Significant dyspnea on exertion persist -Trying to wean down oxygen to baseline   Objective: Vitals:   09/18/21 1954 09/18/21 2251 09/19/21 0423 09/19/21  0713  BP:  126/63 138/66   Pulse:  83 86   Resp:  20 17   Temp:  98.5 F (36.9 C) 97.6 F (36.4 C)   TempSrc:  Oral Oral   SpO2: 97% 98% (!) 89% 93%  Weight:      Height:        Intake/Output Summary (Last 24 hours) at 09/19/2021 1129 Last data filed at 09/18/2021 2300 Gross per 24 hour  Intake 753.62 ml  Output 900 ml  Net -146.38 ml   Filed Weights   09/18/21 1025  Weight: 88 kg   Physical Exam  Gen:- Awake Alert, conversational dyspnea resolving, dyspnea on exertion persist HEENT:- [place].AT, No sclera icterus Neck-Supple Neck,No JVD,.  Nose- [place] 4L/min Lungs-Air movement is slowly improving, much less wheezing  CV- S1, S2 normal, regular  Abd-  +ve B.Sounds, Abd Soft, No tenderness,    Extremity/Skin:- No  edema, pedal pulses present  Psych-affect is appropriate, oriented x3 Neuro-no new focal deficits, no tremors  Data Reviewed: I have personally reviewed following labs and imaging studies  CBC: Recent Labs  Lab 09/18/21 1036  WBC 18.3*  NEUTROABS 15.6*  HGB 12.0*  HCT 39.0  MCV 101.6*  PLT 782   Basic Metabolic Panel: Recent Labs  Lab 09/18/21 1036  NA 135  K 3.9  CL 91*  CO2 38*  GLUCOSE 149*  BUN 13  CREATININE 0.69  CALCIUM 8.8*   GFR: Estimated Creatinine Clearance: 91.3 mL/min (by C-G formula based on SCr of 0.69 mg/dL). Liver Function Tests: Recent Labs  Lab 09/18/21 1036  AST 14*  ALT 12  ALKPHOS 84  BILITOT 0.6  PROT 7.5  ALBUMIN 3.5   Cardiac Enzymes: No results for input(s): "CKTOTAL", "CKMB", "CKMBINDEX", "TROPONINI" in the last 168 hours. BNP (last 3 results) No results for input(s): "PROBNP" in the last 8760 hours. HbA1C: No results for input(s): "HGBA1C" in the last 72 hours. Sepsis Labs: '@LABRCNTIP'$ (procalcitonin:4,lacticidven:4) ) Recent Results (from the past 240 hour(s))  Resp Panel by RT-PCR (Flu A&B, Covid) Anterior Nasal Swab     Status: None   Collection Time: 09/18/21 10:36 AM   Specimen: Anterior Nasal Swab   Result Value Ref Range Status   SARS Coronavirus 2 by RT PCR NEGATIVE NEGATIVE Final    Comment: (NOTE) SARS-CoV-2 target nucleic acids are NOT DETECTED.  The SARS-CoV-2 RNA is generally detectable in upper respiratory specimens during the acute phase of infection. The lowest concentration of SARS-CoV-2 viral copies this assay can detect is 138 copies/mL. A negative result does not preclude SARS-Cov-2 infection and should not be used as the sole basis for treatment or other patient management decisions. A negative result may occur with  improper specimen collection/handling, submission  of specimen other than nasopharyngeal swab, presence of viral mutation(s) within the areas targeted by this assay, and inadequate number of viral copies(<138 copies/mL). A negative result must be combined with clinical observations, patient history, and epidemiological information. The expected result is Negative.  Fact Sheet for Patients:  EntrepreneurPulse.com.au  Fact Sheet for Healthcare Providers:  IncredibleEmployment.be  This test is no t yet approved or cleared by the [place] FDA and  has been authorized for detection and/or diagnosis of SARS-CoV-2 by FDA under an Emergency Use Authorization (EUA). This EUA will remain  in effect (meaning this test can be used) for the duration of the COVID-19 declaration under Section 564(b)(1) of the Act, 21 U.S.C.section 360bbb-3(b)(1), unless the authorization is terminated  or revoked sooner.       Influenza A by PCR NEGATIVE NEGATIVE Final   Influenza B by PCR NEGATIVE NEGATIVE Final    Comment: (NOTE) The Xpert Xpress SARS-CoV-2/FLU/RSV plus assay is intended as an aid in the diagnosis of influenza from Nasopharyngeal swab specimens and should not be used as a sole basis for treatment. Nasal washings and aspirates are unacceptable for Xpert Xpress SARS-CoV-2/FLU/RSV testing.  Fact Sheet for  Patients: EntrepreneurPulse.com.au  Fact Sheet for Healthcare Providers: IncredibleEmployment.be  This test is not yet approved or cleared by the [place] FDA and has been authorized for detection and/or diagnosis of SARS-CoV-2 by FDA under an Emergency Use Authorization (EUA). This EUA will remain in effect (meaning this test can be used) for the duration of the COVID-19 declaration under Section 564(b)(1) of the Act, 21 U.S.C. section 360bbb-3(b)(1), unless the authorization is terminated or revoked.  Performed at [facility], [address] [place], [place] [address]   Blood Culture (routine x 2)     Status: None (Preliminary result)   Collection Time: 09/18/21 10:36 AM   Specimen: BLOOD RIGHT WRIST  Result Value Ref Range Status   Specimen Description BLOOD RIGHT WRIST  Final   Special Requests   Final    BOTTLES DRAWN AEROBIC AND ANAEROBIC Blood Culture adequate volume   Culture   Final    NO GROWTH < 24 HOURS Performed at [facility], [address] [place], [place] [address]    Report Status PENDING  Incomplete  Blood Culture (routine x 2)     Status: None (Preliminary result)   Collection Time: 09/18/21 10:36 AM   Specimen: BLOOD RIGHT ARM  Result Value Ref Range Status   Specimen Description BLOOD RIGHT ARM  Final   Special Requests   Final    BOTTLES DRAWN AEROBIC AND ANAEROBIC Blood Culture adequate volume   Culture   Final    NO GROWTH < 24 HOURS Performed at [facility], [address] [place],  [address]    Report Status PENDING  Incomplete      Radiology Studies: DG Chest Port 1 View  Result Date: 09/18/2021 CLINICAL DATA:  Shortness of breath.  On home oxygen.  Ex-smoker. EXAM: PORTABLE CHEST 1 VIEW COMPARISON:  02/13/2021 FINDINGS: Numerous leads and wires project over the chest. Midline trachea. Normal heart size. Atherosclerosis in the transverse aorta. No pleural effusion or pneumothorax. Emphysema, as  evidenced by lucency in the upper lungs. Persistent lower lung predominant interstitial thickening may be increased. No lobar consolidation. Mild bibasilar scarring. IMPRESSION: COPD/chronic bronchitis.  No acute superimposed process. Aortic Atherosclerosis (ICD10-I70.0). Electronically Signed   By: Abigail Miyamoto M.D.   On: 09/18/2021 10:45     Scheduled Meds:  amiodarone  100 mg Oral Daily   apixaban  5 mg Oral BID   cholecalciferol  2,000 Units Oral Daily   diltiazem  240 mg Oral Daily   doxycycline  100 mg Oral Q12H   guaiFENesin  600 mg Oral BID   ipratropium-albuterol  3 mL Nebulization Q6H   levothyroxine  50 mcg Oral QAC breakfast   methylPREDNISolone (SOLU-MEDROL) injection  40 mg Intravenous Q12H   mirtazapine  15 mg Oral QHS   mometasone-formoterol  2 puff Inhalation BID   multivitamin with minerals  1 tablet Oral Daily   rosuvastatin  10 mg Oral Daily   sodium chloride flush  3 mL Intravenous Q12H   sodium chloride flush  3 mL Intravenous Q12H   traZODone  50 mg Oral QHS   Continuous Infusions:  sodium chloride       LOS: 0 days    Roxan Hockey M.D on 09/19/2021 at 11:29 AM  Go to [email or web address] - for contact info  [facility] - Office  [phone]  If 7PM-7AM, please contact night-coverage [email or web address] 09/19/2021, 11:29 AM

## 2021-09-19 NOTE — TOC Progression Note (Signed)
Transition of Care [facility]) - Progression Note    Patient Details  Name: David Stein MRN: [id number] Date of Birth: 02/26/1946  Transition of Care [facility]) CM/SW Contact  Salome Arnt, [place] Phone Number: 09/19/2021, 10:12 AM  Clinical Narrative:  Transition of Care [facility]) Screening Note   Patient Details  Name: David Stein Date of Birth: 12-25-1945   Transition of Care [facility]) CM/SW Contact:    Salome Arnt, LCSW Phone Number: 09/19/2021, 10:12 AM    Transition of Care Department [facility]) has reviewed patient and no TOC needs have been identified at this time. We will continue to monitor patient advancement through interdisciplinary progression rounds. If new patient transition needs arise, please place a TOC consult.          Barriers to Discharge: Continued Medical Work up  Expected Discharge Plan and Services                                                 Social Determinants of Health (SDOH) Interventions    Readmission Risk Interventions     No data to display

## 2021-09-20 LAB — CBC
HCT: 36.3 % — ABNORMAL LOW (ref 39.0–52.0)
Hemoglobin: 10.7 g/dL — ABNORMAL LOW (ref 13.0–17.0)
MCH: 30.7 pg (ref 26.0–34.0)
MCHC: 29.5 g/dL — ABNORMAL LOW (ref 30.0–36.0)
MCV: 104.3 fL — ABNORMAL HIGH (ref 80.0–100.0)
Platelets: 222 10*3/uL (ref 150–400)
RBC: 3.48 MIL/uL — ABNORMAL LOW (ref 4.22–5.81)
RDW: 13.3 % (ref 11.5–15.5)
WBC: 14.1 10*3/uL — ABNORMAL HIGH (ref 4.0–10.5)
nRBC: 0 % (ref 0.0–0.2)

## 2021-09-20 LAB — BASIC METABOLIC PANEL
Anion gap: 6 (ref 5–15)
BUN: 16 mg/dL (ref 8–23)
CO2: 41 mmol/L — ABNORMAL HIGH (ref 22–32)
Calcium: 9.1 mg/dL (ref 8.9–10.3)
Chloride: 92 mmol/L — ABNORMAL LOW (ref 98–111)
Creatinine, Ser: 0.73 mg/dL (ref 0.61–1.24)
GFR, Estimated: >60 mL/min (ref >60)
Glucose, Bld: 173 mg/dL — ABNORMAL HIGH (ref 70–99)
Potassium: 5 mmol/L (ref 3.5–5.1)
Sodium: 139 mmol/L (ref 135–145)

## 2021-09-20 MED ORDER — FUROSEMIDE 20 MG PO TABS: 20.0000 mg | ORAL_TABLET | Freq: Every day | ORAL | 2 refills | Status: DC

## 2021-09-20 MED ORDER — BUDESONIDE-FORMOTEROL FUMARATE 160-4.5 MCG/ACT IN AERO: 2.0000 | INHALATION_SPRAY | Freq: Two times a day (BID) | RESPIRATORY_TRACT | 11 refills | Status: DC

## 2021-09-20 MED ORDER — PROAIR HFA 108 (90 BASE) MCG/ACT IN AERS: 1.0000 | INHALATION_SPRAY | Freq: Four times a day (QID) | RESPIRATORY_TRACT | 3 refills | Status: DC | PRN

## 2021-09-20 MED ORDER — CEFDINIR 300 MG PO CAPS: 300.0000 mg | ORAL_CAPSULE | Freq: Two times a day (BID) | ORAL | 0 refills | Status: AC

## 2021-09-20 MED ORDER — ALBUTEROL SULFATE (2.5 MG/3ML) 0.083% IN NEBU: 2.5000 mg | INHALATION_SOLUTION | RESPIRATORY_TRACT | 3 refills | Status: DC | PRN

## 2021-09-20 MED ORDER — DILTIAZEM HCL ER COATED BEADS 240 MG PO CP24: 240.0000 mg | ORAL_CAPSULE | Freq: Every day | ORAL | 3 refills | Status: DC

## 2021-09-20 MED ORDER — ALBUTEROL SULFATE (2.5 MG/3ML) 0.083% IN NEBU: 2.5000 mg | INHALATION_SOLUTION | RESPIRATORY_TRACT | Status: DC | PRN

## 2021-09-20 MED ORDER — GUAIFENESIN ER 600 MG PO TB12: 600.0000 mg | ORAL_TABLET | Freq: Two times a day (BID) | ORAL | 0 refills | Status: DC

## 2021-09-20 MED ORDER — SPIRIVA RESPIMAT 2.5 MCG/ACT IN AERS
2.0000 | INHALATION_SPRAY | Freq: Every day | RESPIRATORY_TRACT | 2 refills | Status: DC
Start: 2021-09-20 — End: 2023-01-21

## 2021-09-20 MED ORDER — DOXYCYCLINE HYCLATE 100 MG PO TABS: 100.0000 mg | ORAL_TABLET | Freq: Two times a day (BID) | ORAL | 0 refills | Status: AC

## 2021-09-20 MED ORDER — PREDNISONE 20 MG PO TABS: 40.0000 mg | ORAL_TABLET | Freq: Every day | ORAL | 0 refills | Status: AC

## 2021-09-20 NOTE — Progress Notes (Signed)
This RN reviewed discharge instructions with patient as well as his medication changes. Meds sent to  [facility] on  [address]. He denies further questions. [facility] [place] transported patient via Wheelchair with Wife. Pt wore 3L [place] and appears to be leaving in fair condition. Bryson Corona Edd Fabian

## 2021-09-20 NOTE — Discharge Instructions (Signed)
1) please note that there has been some changes to your medications 2) please follow-up with primary care physician within a week for recheck and reevaluation--CBC and BMP blood test with the primary care physician within a week advised

## 2021-09-20 NOTE — Discharge Summary (Signed)
David Stein, is a 76 y.o. male  DOB 1945/07/01  MRN [id number].  Admission date:  09/18/2021  Admitting Physician  Roxan Hockey, MD  Discharge Date:  09/20/2021   Primary MD  Redmond School, MD  Recommendations for primary care physician for things to follow:    1) please note that there has been some changes to your medications 2) please follow-up with primary care physician within a week for recheck and reevaluation--CBC and BMP blood test with the primary care physician within a week advised  Admission Diagnosis  COPD exacerbation ([place]) [J44.1] COPD with acute exacerbation ([place]) [J44.1]   Discharge Diagnosis  COPD exacerbation ([place]) [J44.1] COPD with acute exacerbation ([name]) [J44.1]    Principal Problem:   COPD exacerbation ([place]) Active Problems:   Acute on chronic respiratory failure with hypoxia ([place])   Chronic anticoagulation   Atrial flutter ([place])   Hypothyroidism   COPD with acute exacerbation ([place])      Past Medical History:  Diagnosis Date   Arthritis    Atrial flutter ([place])    COPD (chronic obstructive pulmonary disease) ([place])    Kidney stones    Oxygen dependent    2.5L   Restless leg syndrome    Shortness of breath dyspnea     Past Surgical History:  Procedure Laterality Date   CATARACT EXTRACTION W/PHACO Right 01/31/2014   Procedure: CATARACT EXTRACTION PHACO AND INTRAOCULAR LENS PLACEMENT RIGHT EYE CDE=6.90;  Surgeon: Elta Guadeloupe T. Gershon Crane, MD;  Location: [facility];  Service: Ophthalmology;  Laterality: Right;   CATARACT EXTRACTION W/PHACO Left 02/14/2014   Procedure: CATARACT EXTRACTION PHACO AND INTRAOCULAR LENS PLACEMENT; CDE:  8.34;  Surgeon: Elta Guadeloupe T. Gershon Crane, MD;  Location: [facility];  Service: Ophthalmology;  Laterality: Left;   KNEE SURGERY Left      HPI  from the history and physical done on the day of admission:      David Stein  is a 76 y.o. male with past medical  history relevant for COPD, atrial flutter, chronic hypoxic respiratory failure currently on 3 L chronically at home presents to the ED with worsening shortness of breath, fevers, productive cough and wheezing for the last couple of days worse over the last 24 hours --no chest pains no palpitation -No leg pains no leg swelling no pleuritic symptoms -Chest x-[place] without acute cardiopulmonary finding -WBC 18.3 Creatinine 0.69 -COVID and flu negative -BNP 99     Hospital Course:   Brief Narrative:   76 y.o. male with past medical history relevant for COPD, atrial flutter, chronic hypoxic respiratory failure currently on 3 L chronically at home admitted on 09/18/2021 with acute on chronic hypoxic respiratory failure due to COPD exacerbation/respiratory infection     -Assessment and Plan: 1)Acute COPD Exacerbation-no further wheezing -Cough improved -No further dyspnea at rest -Patient was treated with IV Solu-Medrol, mucolytics, Rocephin/Doxy bronchodilators and supplemental oxygen -Okay to discharge on prednisone, Omnicef and Doxy     2) leukocytosis may be reactive--- -WBC trended down --UA not suspicious for UTI -  Blood cultures NGTD -Antibiotics as above   3) acute on chronic hypoxic respiratory failure--- due to #1 above -Hypoxia improving oxygen requirement trended back to baseline -BNP is not elevated   4) paroxysmal atrial flutter--- amiodarone and Cardizem for rate control, Eliquis for stroke prophylaxis -Follow-up with Dr. Harl Bowie as outpatient   5) hypothyroidism--- stable, continue levothyroxine   Dispo: The patient is from: Home              Anticipated d/c is to: Home          Code Status : -  Code Status: Full Code    Family Communication: Discussed with wife at bedside  Discharge Condition:   Stable  Follow UP   Follow-up Information     Redmond School, MD. Schedule an appointment as soon as possible for a visit in 1 week(s).   Specialty: Internal  Medicine Contact information: [place] [address] [phone]         Arnoldo Lenis, MD .   Specialty: Cardiology Contact information: [place] [address] [phone]                 Diet and Activity recommendation:  As advised  Discharge Instructions    Discharge Instructions     Call MD for:  difficulty breathing, headache or visual disturbances   Complete by: As directed    Call MD for:  persistant dizziness or light-headedness   Complete by: As directed    Call MD for:  temperature >100.4   Complete by: As directed    Diet - low sodium heart healthy   Complete by: As directed    Discharge instructions   Complete by: As directed    1) please note that there has been some changes to your medications 2) please follow-up with primary care physician within a week for recheck and reevaluation--CBC and BMP blood test with the primary care physician within a week advised   Increase activity slowly   Complete by: As directed        Discharge Medications     Allergies as of 09/20/2021       Reactions   Codeine Other (See Comments)   headache        Medication List     STOP taking these medications    antiseptic oral rinse Liqd       TAKE these medications    acetaminophen 500 MG tablet Commonly known as: TYLENOL Take 500 mg by mouth every 6 (six) hours as needed for mild pain.   albuterol (2.5 MG/3ML) 0.083% nebulizer solution Commonly known as: PROVENTIL Take 3 mLs (2.5 mg total) by nebulization every 4 (four) hours as needed for wheezing or shortness of breath.   ProAir HFA 108 (90 Base) MCG/ACT inhaler Generic drug: albuterol Inhale 1 puff into the lungs every 6 (six) hours as needed for wheezing or shortness of breath.   ALPRAZolam 0.5 MG tablet Commonly known as: XANAX Take 0.5 mg by mouth every 8 (eight) hours as needed for anxiety.   amiodarone 200 MG tablet Commonly known  as: Pacerone Take 0.5 tablets (100 mg total) by mouth daily.   apixaban 5 MG Tabs tablet Commonly known as: ELIQUIS Take 1 tablet (5 mg total) by mouth 2 (two) times daily.   budesonide-formoterol 160-4.5 MCG/ACT inhaler Commonly known as: SYMBICORT Inhale 2 puffs into the lungs 2 (two) times daily. What changed: when to take this  cefdinir 300 MG capsule Commonly known as: OMNICEF Take 1 capsule (300 mg total) by mouth 2 (two) times daily for 5 days.   D3 2000 50 MCG (2000 UT) Caps Generic drug: Cholecalciferol Take 1 capsule by mouth daily.   diltiazem 240 MG 24 hr capsule Commonly known as: CARDIZEM CD Take 1 capsule (240 mg total) by mouth daily. What changed: See the new instructions.   doxycycline 100 MG tablet Commonly known as: VIBRA-TABS Take 1 tablet (100 mg total) by mouth 2 (two) times daily for 5 days.   fluticasone 50 MCG/ACT nasal spray Commonly known as: FLONASE Place 1 spray into both nostrils daily.   furosemide 20 MG tablet Commonly known as: LASIX Take 1 tablet (20 mg total) by mouth daily.   guaiFENesin 600 MG 12 hr tablet Commonly known as: MUCINEX Take 1 tablet (600 mg total) by mouth 2 (two) times daily.   levothyroxine 50 MCG tablet Commonly known as: SYNTHROID Take 50 mcg by mouth daily before breakfast.   M-4 Knee High Stockings Misc 1 each by Does not apply route as directed.   mirtazapine 15 MG tablet Commonly known as: REMERON Take 15 mg by mouth at bedtime.   multivitamin with minerals Tabs tablet Take 1 tablet by mouth daily.   OXYGEN Inhale 3 L into the lungs daily.   predniSONE 10 MG tablet Commonly known as: DELTASONE Take 10 mg by mouth daily as needed (COPD). What changed: Another medication with the same name was added. Make sure you understand how and when to take each.   predniSONE 20 MG tablet Commonly known as: DELTASONE Take 2 tablets (40 mg total) by mouth daily with breakfast for 5 days. What changed: You  were already taking a medication with the same name, and this prescription was added. Make sure you understand how and when to take each.   rosuvastatin 10 MG tablet Commonly known as: CRESTOR Take 10 mg by mouth daily.   Spiriva Respimat 2.5 MCG/ACT Aers Generic drug: Tiotropium Bromide Monohydrate Inhale 2 puffs into the lungs daily.   traZODone 50 MG tablet Commonly known as: DESYREL Take 50 mg by mouth at bedtime.       Major procedures and Radiology Reports - PLEASE review detailed and final reports for all details, in brief -   DG Chest Port 1 View  Result Date: 09/18/2021 CLINICAL DATA:  Shortness of breath.  On home oxygen.  Ex-smoker. EXAM: PORTABLE CHEST 1 VIEW COMPARISON:  02/13/2021 FINDINGS: Numerous leads and wires project over the chest. Midline trachea. Normal heart size. Atherosclerosis in the transverse aorta. No pleural effusion or pneumothorax. Emphysema, as evidenced by lucency in the upper lungs. Persistent lower lung predominant interstitial thickening may be increased. No lobar consolidation. Mild bibasilar scarring. IMPRESSION: COPD/chronic bronchitis.  No acute superimposed process. Aortic Atherosclerosis (ICD10-I70.0). Electronically Signed   By: Abigail Miyamoto M.D.   On: 09/18/2021 10:45    Micro Results   Recent Results (from the past 240 hour(s))  Resp Panel by RT-PCR (Flu A&B, Covid) Anterior Nasal Swab     Status: None   Collection Time: 09/18/21 10:36 AM   Specimen: Anterior Nasal Swab  Result Value Ref Range Status   SARS Coronavirus 2 by RT PCR NEGATIVE NEGATIVE Final    Comment: (NOTE) SARS-CoV-2 target nucleic acids are NOT DETECTED.  The SARS-CoV-2 RNA is generally detectable in upper respiratory specimens during the acute phase of infection. The lowest concentration of SARS-CoV-2 viral copies this assay can  detect is 138 copies/mL. A negative result does not preclude SARS-Cov-2 infection and should not be used as the sole basis for treatment  or other patient management decisions. A negative result may occur with  improper specimen collection/handling, submission of specimen other than nasopharyngeal swab, presence of viral mutation(s) within the areas targeted by this assay, and inadequate number of viral copies(<138 copies/mL). A negative result must be combined with clinical observations, patient history, and epidemiological information. The expected result is Negative.  Fact Sheet for Patients:  EntrepreneurPulse.com.au  Fact Sheet for Healthcare Providers:  IncredibleEmployment.be  This test is no t yet approved or cleared by the [place] FDA and  has been authorized for detection and/or diagnosis of SARS-CoV-2 by FDA under an Emergency Use Authorization (EUA). This EUA will remain  in effect (meaning this test can be used) for the duration of the COVID-19 declaration under Section 564(b)(1) of the Act, 21 U.S.C.section 360bbb-3(b)(1), unless the authorization is terminated  or revoked sooner.       Influenza A by PCR NEGATIVE NEGATIVE Final   Influenza B by PCR NEGATIVE NEGATIVE Final    Comment: (NOTE) The Xpert Xpress SARS-CoV-2/FLU/RSV plus assay is intended as an aid in the diagnosis of influenza from Nasopharyngeal swab specimens and should not be used as a sole basis for treatment. Nasal washings and aspirates are unacceptable for Xpert Xpress SARS-CoV-2/FLU/RSV testing.  Fact Sheet for Patients: EntrepreneurPulse.com.au  Fact Sheet for Healthcare Providers: IncredibleEmployment.be  This test is not yet approved or cleared by the [place] FDA and has been authorized for detection and/or diagnosis of SARS-CoV-2 by FDA under an Emergency Use Authorization (EUA). This EUA will remain in effect (meaning this test can be used) for the duration of the COVID-19 declaration under Section 564(b)(1) of the Act, 21 U.S.C. section  360bbb-3(b)(1), unless the authorization is terminated or revoked.  Performed at [facility], [address], [place], [place] [address]   Blood Culture (routine x 2)     Status: None (Preliminary result)   Collection Time: 09/18/21 10:36 AM   Specimen: BLOOD RIGHT WRIST  Result Value Ref Range Status   Specimen Description BLOOD RIGHT WRIST  Final   Special Requests   Final    BOTTLES DRAWN AEROBIC AND ANAEROBIC Blood Culture adequate volume   Culture   Final    NO GROWTH 2 DAYS Performed at [facility], [address], [place], [place] [address]    Report Status PENDING  Incomplete  Blood Culture (routine x 2)     Status: None (Preliminary result)   Collection Time: 09/18/21 10:36 AM   Specimen: BLOOD RIGHT ARM  Result Value Ref Range Status   Specimen Description BLOOD RIGHT ARM  Final   Special Requests   Final    BOTTLES DRAWN AEROBIC AND ANAEROBIC Blood Culture adequate volume   Culture   Final    NO GROWTH 2 DAYS Performed at [facility], [address], [place], [place] [address]    Report Status PENDING  Incomplete  Urine Culture     Status: None   Collection Time: 09/18/21 10:36 AM   Specimen: Urine, Catheterized  Result Value Ref Range Status   Specimen Description   Final    URINE, CATHETERIZED Performed at [facility], [address], [place], [place] [address]    Special Requests   Final    NONE Performed at [facility], [address], [place],  [address]    Culture   Final  NO GROWTH Performed at [facility], [place] [address], [place], [place] [address]    Report Status 09/19/2021 FINAL  Final   Today   Subjective    David Stein today has no new complaints No fever  Or chills   No Nausea, Vomiting or Diarrhea     Patient has been seen and examined prior to discharge   Objective   Blood pressure 122/60, pulse 89, temperature 97.7 F (36.5 C), temperature source Oral, resp. rate 20, height '6\' 2"'$  (1.88 m), weight 88 kg, SpO2 100  %.   Intake/Output Summary (Last 24 hours) at 09/20/2021 1057 Last data filed at 09/20/2021 0800 Gross per 24 hour  Intake --  Output 2150 ml  Net -2150 ml   Exam Gen:- Awake Alert, no acute distress , no conversational dyspnea HEENT:- [place].AT, No sclera icterus Nose- [place] 2L/min Neck-Supple Neck,No JVD,.  Lungs-much improved air movement, no wheezing CV- S1, S2 normal, irregular Abd-  +ve B.Sounds, Abd Soft, No tenderness,    Extremity/Skin:- No  edema,   good pulses Psych-affect is appropriate, oriented x3 Neuro-no new focal deficits, no tremors    Data Review   CBC w Diff:  Lab Results  Component Value Date   WBC 14.1 (H) 09/20/2021   HGB 10.7 (L) 09/20/2021   HCT 36.3 (L) 09/20/2021   PLT 222 09/20/2021   LYMPHOPCT 4 09/18/2021   MONOPCT 10 09/18/2021   EOSPCT 0 09/18/2021   BASOPCT 0 09/18/2021    CMP:  Lab Results  Component Value Date   NA 139 09/20/2021   K 5.0 09/20/2021   CL 92 (L) 09/20/2021   CO2 41 (H) 09/20/2021   BUN 16 09/20/2021   CREATININE 0.73 09/20/2021   CREATININE 0.91 12/27/2015   PROT 7.5 09/18/2021   ALBUMIN 3.5 09/18/2021   BILITOT 0.6 09/18/2021   ALKPHOS 84 09/18/2021   AST 14 (L) 09/18/2021   ALT 12 09/18/2021  .  Total Discharge time is about 33 minutes  Roxan Hockey M.D on 09/20/2021 at 10:57 AM  Go to [email or web address] -  for contact info  [facility] - Office  [phone]

## 2021-09-23 LAB — CULTURE, BLOOD (ROUTINE X 2)
Culture: NO GROWTH
Culture: NO GROWTH
Special Requests: ADEQUATE
Special Requests: ADEQUATE

## 2021-09-26 DIAGNOSIS — I4891 Unspecified atrial fibrillation: Secondary | ICD-10-CM | POA: Diagnosis not present

## 2021-09-26 DIAGNOSIS — J9610 Chronic respiratory failure, unspecified whether with hypoxia or hypercapnia: Secondary | ICD-10-CM | POA: Diagnosis not present

## 2021-09-26 DIAGNOSIS — J189 Pneumonia, unspecified organism: Secondary | ICD-10-CM | POA: Diagnosis not present

## 2021-09-26 DIAGNOSIS — J449 Chronic obstructive pulmonary disease, unspecified: Secondary | ICD-10-CM | POA: Diagnosis not present

## 2021-10-03 ENCOUNTER — Other Ambulatory Visit: Payer: Self-pay | Admitting: Internal Medicine

## 2021-10-07 ENCOUNTER — Telehealth: Payer: Medicare Other | Admitting: Emergency Medicine

## 2021-10-07 DIAGNOSIS — M79606 Pain in leg, unspecified: Secondary | ICD-10-CM

## 2021-10-07 NOTE — ED Provider Notes (Signed)
Virtual Visit Consent   David Stein, you are scheduled for a virtual visit with a [facility] provider today. Just as with appointments in the office, your consent must be obtained to participate. Your consent will be active for this visit and any virtual visit you may have with one of our providers in the next 365 days. If you have a MyChart account, a copy of this consent can be sent to you electronically.  As this is a virtual visit, video technology does not allow for your provider to perform a traditional examination. This may limit your provider's ability to fully assess your condition. If your provider identifies any concerns that need to be evaluated in person or the need to arrange testing (such as labs, EKG, etc.), we will make arrangements to do so. Although advances in technology are sophisticated, we cannot ensure that it will always work on either your end or our end. If the connection with a video visit is poor, the visit may have to be switched to a telephone visit. With either a video or telephone visit, we are not always able to ensure that we have a secure connection.  By engaging in this virtual visit, you consent to the provision of healthcare and authorize for your insurance to be billed (if applicable) for the services provided during this visit. Depending on your insurance coverage, you may receive a charge related to this service.  I need to obtain your verbal consent now. Are you willing to proceed with your visit today? David Stein has provided verbal consent on 10/07/2021 for a virtual visit (video or telephone). Montine Circle, PA-C  Date: 10/07/2021 11:00 AM  Virtual Visit via Video Note   I, Montine Circle, connected with  David Stein  ([id number], 1945-07-14) on 10/07/21 at 10:45 AM EDT by a video-enabled telemedicine application and verified that I am speaking with the correct person using two identifiers.  Location: Patient: Virtual Visit Location Patient:  Home Provider: Virtual Visit Location Provider: Home   I discussed the limitations of evaluation and management by telemedicine and the availability of in person appointments. The patient expressed understanding and agreed to proceed.    History of Present Illness: David Stein is a 76 y.o. who identifies as a male who was assigned male at birth, and is being seen today for severe leg pain.  Denies injuries.  Reports some swelling and small patch of redness.  Takes Eliquis.  Recently hospitalized.  Denies fever.  States pain is so bad he can't stand or walk.  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     Arthritis    Restless leg syndrome    Kidney stones     Allergies:  Allergies  Allergen Reactions   Codeine Other (See Comments)    headache   Medications:  Current Outpatient Medications:    acetaminophen (TYLENOL) 500 MG tablet, Take 500 mg by mouth every 6 (six) hours as needed for mild pain., Disp: , Rfl:    albuterol (PROVENTIL) (2.5 MG/3ML) 0.083% nebulizer solution, Take 3 mLs (2.5 mg total) by nebulization every 4 (four) hours as needed for wheezing or shortness of breath., Disp: 75 mL, Rfl: 3   ALPRAZolam (XANAX) 0.5 MG tablet, Take 0.5 mg by mouth every 8 (eight)  hours as needed for anxiety., Disp: , Rfl:    amiodarone (PACERONE) 200 MG tablet, TAKE (1/2) A TABLET BY MOUTH ONCE A DAY., Disp: 45 tablet, Rfl: 2   apixaban (ELIQUIS) 5 MG TABS tablet, Take 1 tablet (5 mg total) by mouth 2 (two) times daily., Disp: 60 tablet, Rfl: 0   budesonide-formoterol (SYMBICORT) 160-4.5 MCG/ACT inhaler, Inhale 2 puffs into the lungs 2 (two) times daily., Disp: 10.2 g, Rfl: 11   Cholecalciferol (D3 2000) 50 MCG (2000 UT) CAPS, Take 1 capsule by mouth daily., Disp: , Rfl:    diltiazem (CARDIZEM CD) 240 MG 24 hr capsule, Take 1 capsule (240 mg total) by mouth daily., Disp: 90 capsule, Rfl: 3   Elastic Bandages & Supports (M-4 KNEE HIGH STOCKINGS) MISC, 1 each by Does not apply route as directed., Disp: 1 each, Rfl: 0   fluticasone (FLONASE) 50 MCG/ACT nasal spray, Place 1 spray into both nostrils daily., Disp: , Rfl:    furosemide (LASIX) 20 MG tablet, Take 1 tablet (20 mg total) by mouth daily., Disp: 30 tablet, Rfl: 2   guaiFENesin (MUCINEX) 600 MG 12 hr tablet, Take 1 tablet (600 mg total) by mouth 2 (two) times daily., Disp: 60 tablet, Rfl: 0   levothyroxine (SYNTHROID, LEVOTHROID) 50 MCG tablet, Take 50 mcg by mouth daily before breakfast., Disp: , Rfl:    mirtazapine (REMERON) 15 MG tablet, Take 15 mg by mouth at bedtime., Disp: , Rfl:    Multiple Vitamin (MULTIVITAMIN WITH MINERALS) TABS tablet, Take 1 tablet by mouth daily., Disp: , Rfl:    OXYGEN, Inhale 3 L into the lungs daily., Disp: , Rfl:    predniSONE (DELTASONE) 10 MG tablet, Take 10 mg by mouth daily as needed (COPD)., Disp: , Rfl:    PROAIR HFA 108 (90 Base) MCG/ACT inhaler, Inhale 1 puff into the lungs every 6 (six) hours as needed for wheezing or shortness of breath., Disp: 18 g, Rfl: 3   rosuvastatin (CRESTOR) 10 MG tablet, Take 10 mg by mouth daily., Disp: , Rfl:    Tiotropium Bromide Monohydrate (SPIRIVA RESPIMAT) 2.5 MCG/ACT AERS, Inhale 2 puffs into the lungs daily., Disp: 4 g, Rfl: 2   traZODone  (DESYREL) 50 MG tablet, Take 50 mg by mouth at bedtime., Disp: , Rfl:   Observations/Objective: Telephone call Normal speech Breathing unlabored Speech is clear and coherent with logical content.  Patient is alert and oriented at baseline.    Assessment and Plan: 1. Pain of lower extremity, unspecified laterality   Redirect to ED for [place] and in-person exam.  Follow Up Instructions: I discussed the assessment and treatment plan with the patient. The patient was provided an opportunity to ask questions and all were answered. The patient agreed with the plan and demonstrated an understanding of the instructions.  A copy of instructions were sent to the patient via MyChart unless otherwise noted below.     The patient was advised to call back or seek an in-person evaluation if the symptoms worsen or if the condition fails  to improve as anticipated.  Time:  I spent 10 minutes with the patient via telehealth technology discussing the above problems/concerns.    Montine Circle, PA-C

## 2021-10-07 NOTE — Progress Notes (Signed)
Virtual Visit Consent    [name], you are scheduled for a virtual visit with a [facility] provider today. Just as with appointments in the office, your consent must be obtained to participate. Your consent will be active for this visit and any virtual visit you may have with one of our providers in the next 365 days. If you have a MyChart account, a copy of this consent can be sent to you electronically.   As this is a virtual visit, video technology does not allow for your provider to perform a traditional examination. This may limit your provider's ability to fully assess your condition. If your provider identifies any concerns that need to be evaluated in person or the need to arrange testing (such as labs, EKG, etc.), we will make arrangements to do so. Although advances in technology are sophisticated, we cannot ensure that it will always work on either your end or our end. If the connection with a video visit is poor, the visit may have to be switched to a telephone visit. With either a video or telephone visit, we are not always able to ensure that we have a secure connection.   By engaging in this virtual visit, you consent to the provision of healthcare and authorize for your insurance to be billed (if applicable) for the services provided during this visit. Depending on your insurance coverage, you may receive a charge related to this service.   I need to obtain your verbal consent now. Are you willing to proceed with your visit today? Dyshaun JUSTEN Stein has provided verbal consent on 10/07/2021 for a virtual visit (video or telephone). Montine Circle, PA-C   Date: 10/07/2021 11:00 AM   Virtual Visit via Video Note    I, Montine Circle, connected with  David Stein  ([id number], [id number]) on 10/07/21 at 10:45 AM EDT by a video-enabled telemedicine application and verified that I am speaking with the correct person using two identifiers.   Location: Patient: Virtual Visit Location  Patient: Home Provider: Virtual Visit Location Provider: Home   I discussed the limitations of evaluation and management by telemedicine and the availability of in person appointments. The patient expressed understanding and agreed to proceed.     History of Present Illness: David Stein is a 76 y.o. who identifies as a male who was assigned male at birth, and is being seen today for severe leg pain.  Denies injuries.  Reports some swelling and small patch of redness.  Takes Eliquis.  Recently hospitalized.  Denies fever.  States pain is so bad he can't stand or walk.   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      Arthritis     Restless leg syndrome     Kidney stones      Allergies:  Allergies  Allergen Reactions   Codeine Other (See Comments)      headache    Medications:  Current Outpatient Medications:    acetaminophen (TYLENOL) 500 MG tablet, Take 500 mg by mouth every 6 (six) hours as needed for mild pain., Disp: , Rfl:    albuterol (PROVENTIL) (2.5 MG/3ML) 0.083% nebulizer solution, Take 3 mLs (2.5 mg total) by nebulization every 4 (four) hours as needed for wheezing or shortness of breath., Disp: 75 mL, Rfl: 3   ALPRAZolam (XANAX) 0.5 MG tablet, Take 0.5 mg  by mouth every 8 (eight) hours as needed for anxiety., Disp: , Rfl:    amiodarone (PACERONE) 200 MG tablet, TAKE (1/2) A TABLET BY MOUTH ONCE A DAY., Disp: 45 tablet, Rfl: 2   apixaban (ELIQUIS) 5 MG TABS tablet, Take 1 tablet (5 mg total) by mouth 2 (two) times daily., Disp: 60 tablet, Rfl: 0   budesonide-formoterol (SYMBICORT) 160-4.5 MCG/ACT inhaler, Inhale 2 puffs into the lungs 2 (two) times daily., Disp: 10.2 g, Rfl: 11   Cholecalciferol (D3 2000) 50 MCG (2000 UT) CAPS, Take 1 capsule by mouth daily., Disp: , Rfl:    diltiazem (CARDIZEM CD) 240 MG 24 hr capsule, Take 1 capsule (240 mg total) by mouth daily., Disp: 90 capsule, Rfl: 3   Elastic Bandages & Supports (M-4 KNEE HIGH STOCKINGS) MISC, 1 each by Does not apply route as directed., Disp: 1 each, Rfl: 0   fluticasone (FLONASE) 50 MCG/ACT nasal spray, Place 1 spray into both nostrils daily., Disp: , Rfl:    furosemide (LASIX) 20 MG tablet, Take 1 tablet (20 mg total) by mouth daily., Disp: 30 tablet, Rfl: 2   guaiFENesin (MUCINEX) 600 MG 12 hr tablet, Take 1 tablet (600 mg total) by mouth 2 (two) times daily., Disp: 60 tablet, Rfl: 0   levothyroxine (SYNTHROID, LEVOTHROID) 50 MCG tablet, Take 50 mcg by mouth daily before breakfast., Disp: , Rfl:    mirtazapine (REMERON) 15 MG tablet, Take 15 mg by mouth at bedtime., Disp: , Rfl:    Multiple Vitamin (MULTIVITAMIN WITH MINERALS) TABS tablet, Take 1 tablet by mouth daily., Disp: , Rfl:    OXYGEN, Inhale 3 L into the lungs daily., Disp: , Rfl:    predniSONE (DELTASONE) 10 MG tablet, Take 10 mg by mouth daily as needed (COPD)., Disp: , Rfl:    PROAIR HFA 108 (90 Base) MCG/ACT inhaler, Inhale 1 puff into the lungs every 6 (six) hours as needed for wheezing or shortness of breath., Disp: 18 g, Rfl: 3   rosuvastatin (CRESTOR) 10 MG tablet, Take 10 mg by mouth daily., Disp: , Rfl:    Tiotropium Bromide Monohydrate (SPIRIVA RESPIMAT) 2.5 MCG/ACT AERS, Inhale 2 puffs into the lungs daily., Disp: 4 g,  Rfl: 2   traZODone (DESYREL) 50 MG tablet, Take 50 mg by mouth at bedtime., Disp: , Rfl:    Observations/Objective: Telephone call Normal speech Breathing unlabored Speech is clear and coherent with logical content.  Patient is alert and oriented at baseline.      Assessment and Plan: 1. Pain of lower extremity, unspecified laterality     Redirect to ED for [place] and in-person exam.   Follow Up Instructions: I discussed the assessment and treatment plan with the patient. The patient was provided an opportunity to ask questions and all were answered. The patient agreed with the plan and demonstrated an understanding of the instructions.  A copy of instructions were sent to the patient via MyChart unless otherwise  noted below.        The patient was advised to call back or seek an in-person evaluation if the symptoms worsen or if the condition fails to improve as anticipated.   Time:  I spent 10 minutes with the patient via telehealth technology discussing the above problems/concerns.     Montine Circle, PA-C

## 2021-10-08 DIAGNOSIS — M79605 Pain in left leg: Secondary | ICD-10-CM | POA: Diagnosis not present

## 2021-10-08 DIAGNOSIS — M159 Polyosteoarthritis, unspecified: Secondary | ICD-10-CM | POA: Diagnosis not present

## 2021-10-08 DIAGNOSIS — Z6826 Body mass index (BMI) 26.0-26.9, adult: Secondary | ICD-10-CM | POA: Diagnosis not present

## 2021-10-15 DIAGNOSIS — F4312 Post-traumatic stress disorder, chronic: Secondary | ICD-10-CM | POA: Diagnosis not present

## 2021-10-21 ENCOUNTER — Other Ambulatory Visit: Payer: Self-pay | Admitting: Internal Medicine

## 2021-10-22 DIAGNOSIS — F4312 Post-traumatic stress disorder, chronic: Secondary | ICD-10-CM | POA: Diagnosis not present

## 2021-11-05 DIAGNOSIS — F4312 Post-traumatic stress disorder, chronic: Secondary | ICD-10-CM | POA: Diagnosis not present

## 2021-12-01 ENCOUNTER — Emergency Department (HOSPITAL_COMMUNITY): Payer: Medicare Other

## 2021-12-01 ENCOUNTER — Encounter (HOSPITAL_COMMUNITY): Payer: Self-pay | Admitting: Emergency Medicine

## 2021-12-01 ENCOUNTER — Other Ambulatory Visit: Payer: Self-pay

## 2021-12-01 ENCOUNTER — Emergency Department (HOSPITAL_COMMUNITY)
Admission: EM | Admit: 2021-12-01 | Discharge: 2021-12-01 | Disposition: A | Discharge status: Home / Self Care | Payer: Medicare Other | Attending: Emergency Medicine | Admitting: Emergency Medicine

## 2021-12-01 DIAGNOSIS — S20212A Contusion of left front wall of thorax, initial encounter: Secondary | ICD-10-CM | POA: Insufficient documentation

## 2021-12-01 DIAGNOSIS — Z23 Encounter for immunization: Secondary | ICD-10-CM | POA: Insufficient documentation

## 2021-12-01 DIAGNOSIS — R319 Hematuria, unspecified: Secondary | ICD-10-CM | POA: Insufficient documentation

## 2021-12-01 DIAGNOSIS — R531 Weakness: Secondary | ICD-10-CM | POA: Insufficient documentation

## 2021-12-01 DIAGNOSIS — S0990XA Unspecified injury of head, initial encounter: Secondary | ICD-10-CM | POA: Diagnosis not present

## 2021-12-01 DIAGNOSIS — S4991XA Unspecified injury of right shoulder and upper arm, initial encounter: Secondary | ICD-10-CM | POA: Diagnosis present

## 2021-12-01 DIAGNOSIS — S8001XA Contusion of right knee, initial encounter: Secondary | ICD-10-CM | POA: Diagnosis not present

## 2021-12-01 DIAGNOSIS — S61411A Laceration without foreign body of right hand, initial encounter: Secondary | ICD-10-CM | POA: Insufficient documentation

## 2021-12-01 DIAGNOSIS — Z7901 Long term (current) use of anticoagulants: Secondary | ICD-10-CM | POA: Insufficient documentation

## 2021-12-01 DIAGNOSIS — S0083XA Contusion of other part of head, initial encounter: Secondary | ICD-10-CM | POA: Diagnosis not present

## 2021-12-01 DIAGNOSIS — S41111A Laceration without foreign body of right upper arm, initial encounter: Secondary | ICD-10-CM | POA: Diagnosis not present

## 2021-12-01 DIAGNOSIS — W19XXXA Unspecified fall, initial encounter: Secondary | ICD-10-CM

## 2021-12-01 DIAGNOSIS — W01198A Fall on same level from slipping, tripping and stumbling with subsequent striking against other object, initial encounter: Secondary | ICD-10-CM | POA: Insufficient documentation

## 2021-12-01 DIAGNOSIS — Z043 Encounter for examination and observation following other accident: Secondary | ICD-10-CM | POA: Diagnosis not present

## 2021-12-01 DIAGNOSIS — J449 Chronic obstructive pulmonary disease, unspecified: Secondary | ICD-10-CM | POA: Diagnosis not present

## 2021-12-01 DIAGNOSIS — T148XXA Other injury of unspecified body region, initial encounter: Secondary | ICD-10-CM

## 2021-12-01 DIAGNOSIS — R0781 Pleurodynia: Secondary | ICD-10-CM | POA: Diagnosis not present

## 2021-12-01 LAB — CBC WITH DIFFERENTIAL/PLATELET
Abs Immature Granulocytes: 0.09 10*3/uL — ABNORMAL HIGH (ref 0.00–0.07)
Basophils Absolute: 0.1 10*3/uL (ref 0.0–0.1)
Basophils Relative: 0 %
Eosinophils Absolute: 0.2 10*3/uL (ref 0.0–0.5)
Eosinophils Relative: 1 %
HCT: 42 % (ref 39.0–52.0)
Hemoglobin: 12.6 g/dL — ABNORMAL LOW (ref 13.0–17.0)
Immature Granulocytes: 1 %
Lymphocytes Relative: 22 %
Lymphs Abs: 2.9 10*3/uL (ref 0.7–4.0)
MCH: 31.2 pg (ref 26.0–34.0)
MCHC: 30 g/dL (ref 30.0–36.0)
MCV: 104 fL — ABNORMAL HIGH (ref 80.0–100.0)
Monocytes Absolute: 0.9 10*3/uL (ref 0.1–1.0)
Monocytes Relative: 7 %
Neutro Abs: 9.1 10*3/uL — ABNORMAL HIGH (ref 1.7–7.7)
Neutrophils Relative %: 69 %
Platelets: 161 10*3/uL (ref 150–400)
RBC: 4.04 MIL/uL — ABNORMAL LOW (ref 4.22–5.81)
RDW: 13.8 % (ref 11.5–15.5)
WBC: 13.1 10*3/uL — ABNORMAL HIGH (ref 4.0–10.5)
nRBC: 0 % (ref 0.0–0.2)

## 2021-12-01 LAB — COMPREHENSIVE METABOLIC PANEL
ALT: 20 U/L (ref 0–44)
AST: 17 U/L (ref 15–41)
Albumin: 3.4 g/dL — ABNORMAL LOW (ref 3.5–5.0)
Alkaline Phosphatase: 75 U/L (ref 38–126)
Anion gap: 12 (ref 5–15)
BUN: 16 mg/dL (ref 8–23)
CO2: 37 mmol/L — ABNORMAL HIGH (ref 22–32)
Calcium: 8.7 mg/dL — ABNORMAL LOW (ref 8.9–10.3)
Chloride: 87 mmol/L — ABNORMAL LOW (ref 98–111)
Creatinine, Ser: 0.64 mg/dL (ref 0.61–1.24)
GFR, Estimated: >60 mL/min (ref >60)
Glucose, Bld: 191 mg/dL — ABNORMAL HIGH (ref 70–99)
Potassium: 3.9 mmol/L (ref 3.5–5.1)
Sodium: 136 mmol/L (ref 135–145)
Total Bilirubin: 0.9 mg/dL (ref 0.3–1.2)
Total Protein: 7.1 g/dL (ref 6.5–8.1)

## 2021-12-01 LAB — URINALYSIS, ROUTINE W REFLEX MICROSCOPIC
Bacteria, UA: NONE SEEN
Bilirubin Urine: NEGATIVE
Glucose, UA: NEGATIVE mg/dL
Ketones, ur: NEGATIVE mg/dL
Leukocytes,Ua: NEGATIVE
Nitrite: NEGATIVE
Protein, ur: 100 mg/dL — AB
RBC / HPF: >50 RBC/hpf — ABNORMAL HIGH (ref 0–5)
Specific Gravity, Urine: 1.021 (ref 1.005–1.030)
pH: 5 (ref 5.0–8.0)

## 2021-12-01 MED ORDER — TETANUS-DIPHTH-ACELL PERTUSSIS 5-2.5-18.5 LF-MCG/0.5 IM SUSY
0.5000 mL | PREFILLED_SYRINGE | Freq: Once | INTRAMUSCULAR | Status: AC
Start: 2021-12-01 — End: 2021-12-01
  Administered 2021-12-01: 0.5 mL via INTRAMUSCULAR
  Filled 2021-12-01: qty 0.5

## 2021-12-01 MED ORDER — ONDANSETRON HCL 4 MG/2ML IJ SOLN: 4.0000 mg | Freq: Three times a day (TID) | INTRAMUSCULAR | Status: DC | PRN

## 2021-12-01 MED ORDER — LACTATED RINGERS IV BOLUS
500.0000 mL | Freq: Once | INTRAVENOUS | Status: AC
  Administered 2021-12-01: 500 mL via INTRAVENOUS

## 2021-12-01 MED ORDER — ACETAMINOPHEN 500 MG PO TABS
1000.0000 mg | ORAL_TABLET | Freq: Once | ORAL | Status: AC
  Administered 2021-12-01: 1000 mg via ORAL
  Filled 2021-12-01: qty 2

## 2021-12-01 NOTE — ED Provider Notes (Signed)
[facility] Provider Note   CSN: [id number] Arrival date & time: 12/01/21  0033     History  Chief Complaint  Patient presents with   David Stein is a 76 y.o. male.  76 yo M who presents to the ED today after a fall.  Patient is normally in a wheelchair secondary to deconditioning, blood thinners and multiple medications causing him to be weak.  He does get out of wheelchair to perform ADLs.  Walks with a walker sometimes.  States that tonight he got out and was going to the bathroom when he felt very wobbly and then fell down.  Hit his head and has some skin tears.  No significant bony pain but then had 3 episodes of nonbloody nonbilious emesis within the last hour.  Is on Eliquis for A-fib.  I reviewed the records from previously appears that in April he had an echo that showed severely reduced right-sided cardiac function but normal EF.  Has COPD is on 4 L baseline.   Fall       Home Medications Prior to Admission medications   Medication Sig Start Date End Date Taking? Authorizing Provider  acetaminophen (TYLENOL) 500 MG tablet Take 500 mg by mouth every 6 (six) hours as needed for mild pain.    [provider]  albuterol (PROVENTIL) (2.5 MG/3ML) 0.083% nebulizer solution Take 3 mLs (2.5 mg total) by nebulization every 4 (four) hours as needed for wheezing or shortness of breath. 09/20/21   Roxan Hockey, MD  ALPRAZolam Duanne Moron) 0.5 MG tablet Take 0.5 mg by mouth every 8 (eight) hours as needed for anxiety. 06/11/20   [provider]  amiodarone (PACERONE) 200 MG tablet TAKE (1/2) A TABLET BY MOUTH ONCE A DAY. 10/03/21   Evans Lance, MD  apixaban (ELIQUIS) 5 MG TABS tablet Take 1 tablet (5 mg total) by mouth 2 (two) times daily. 12/14/15   Laqueta Linden, MD  budesonide-formoterol [facility]) 160-4.5 MCG/ACT inhaler Inhale 2 puffs into the lungs 2 (two) times daily. 09/20/21   Roxan Hockey, MD  Cholecalciferol (D3  2000) 50 MCG (2000 UT) CAPS Take 1 capsule by mouth daily.    [provider]  diltiazem (CARDIZEM CD) 240 MG 24 hr capsule Take 1 capsule (240 mg total) by mouth daily. 09/20/21   Roxan Hockey, MD  Elastic Bandages & Supports (M-4 KNEE HIGH STOCKINGS) MISC 1 each by Does not apply route as directed. 08/02/20   Arnoldo Lenis, MD  fluticasone (FLONASE) 50 MCG/ACT nasal spray Place 1 spray into both nostrils daily.    [provider]  furosemide (LASIX) 20 MG tablet Take 1 tablet (20 mg total) by mouth daily. 09/20/21   Roxan Hockey, MD  guaiFENesin (MUCINEX) 600 MG 12 hr tablet Take 1 tablet (600 mg total) by mouth 2 (two) times daily. 09/20/21   Roxan Hockey, MD  levothyroxine (SYNTHROID, LEVOTHROID) 50 MCG tablet Take 50 mcg by mouth daily before breakfast.    [provider]  mirtazapine (REMERON) 15 MG tablet Take 15 mg by mouth at bedtime. 06/25/21   [provider]  Multiple Vitamin (MULTIVITAMIN WITH MINERALS) TABS tablet Take 1 tablet by mouth daily.    [provider]  OXYGEN Inhale 3 L into the lungs daily.    [provider]  predniSONE (DELTASONE) 10 MG tablet Take 10 mg by mouth daily as needed (COPD). 08/30/21   [provider]  [place] 108 (  90 Base) MCG/ACT inhaler Inhale 1 puff into the lungs every 6 (six) hours as needed for wheezing or shortness of breath. 09/20/21   Roxan Hockey, MD  rosuvastatin (CRESTOR) 10 MG tablet Take 10 mg by mouth daily. 06/12/20   [provider]  Tiotropium Bromide Monohydrate (SPIRIVA RESPIMAT) 2.5 MCG/ACT AERS Inhale 2 puffs into the lungs daily. 09/20/21   Roxan Hockey, MD  traZODone (DESYREL) 50 MG tablet Take 50 mg by mouth at bedtime.    [provider]      Allergies    Codeine    Review of Systems   Review of Systems  Physical Exam Updated Vital Signs BP (!) 117/58   Pulse 89   Temp 98.9 F (37.2 C) (Oral)   Resp 19   Ht '6\' 2"'$  (1.88 m)    Wt 88.5 kg   SpO2 92%   BMI 25.04 kg/m  Physical Exam Vitals and nursing note reviewed.  Constitutional:      Appearance: He is well-developed.  HENT:     Head: Normocephalic.     Comments: Contusion and abrasion to right forehead    Mouth/Throat:     Mouth: Mucous membranes are moist.     Pharynx: Oropharynx is clear.  Eyes:     Pupils: Pupils are equal, round, and reactive to light.  Cardiovascular:     Rate and Rhythm: Normal rate.  Pulmonary:     Effort: Pulmonary effort is normal. No respiratory distress.  Abdominal:     General: There is no distension.  Musculoskeletal:        General: Normal range of motion.     Cervical back: Normal range of motion.  Skin:    Comments: Small ecchymosis and abrasion to right knee Multiple skin tears to right arm and hand without laceration Bruising to left posterior ribs  Neurological:     General: No focal deficit present.     Mental Status: He is alert.     ED Results / Procedures / Treatments   Labs (all labs ordered are listed, but only abnormal results are displayed) Labs Reviewed  CBC WITH DIFFERENTIAL/PLATELET - Abnormal; Notable for the following components:      Result Value   WBC 13.1 (*)    RBC 4.04 (*)    Hemoglobin 12.6 (*)    MCV 104.0 (*)    Neutro Abs 9.1 (*)    Abs Immature Granulocytes 0.09 (*)    All other components within normal limits  COMPREHENSIVE METABOLIC PANEL - Abnormal; Notable for the following components:   Chloride 87 (*)    CO2 37 (*)    Glucose, Bld 191 (*)    Calcium 8.7 (*)    Albumin 3.4 (*)    All other components within normal limits  URINALYSIS, ROUTINE W REFLEX MICROSCOPIC - Abnormal; Notable for the following components:   APPearance HAZY (*)    Hgb urine dipstick SMALL (*)    Protein, ur 100 (*)    RBC / HPF >50 (*)    All other components within normal limits    EKG None  Radiology DG Ribs Unilateral W/Chest Left  Result Date: 12/01/2021 CLINICAL DATA:  Left  posterior rib pain after fall EXAM: LEFT RIBS AND CHEST - 3+ VIEW COMPARISON:  Radiographs 09/18/2021 FINDINGS: Demineralization limits sensitivity for subtle fractures. No displaced rib fractures are noted. No pneumothorax or pleural effusion. Bibasilar atelectasis/scarring. Interstitial coarsening. Stable cardiomediastinal silhouette. Aortic calcification. IMPRESSION: No definite displaced rib fractures. COPD/chronic  bronchitis. Electronically Signed   By: Placido Sou M.D.   On: 12/01/2021 01:52   CT Head Wo Contrast  Result Date: 12/01/2021 CLINICAL DATA:  Fall on Eliquis with trauma to the right side of the head; vomited 3 times since falling EXAM: CT HEAD WITHOUT CONTRAST CT CERVICAL SPINE WITHOUT CONTRAST TECHNIQUE: Multidetector CT imaging of the head and cervical spine was performed following the standard protocol without intravenous contrast. Multiplanar CT image reconstructions of the cervical spine were also generated. RADIATION DOSE REDUCTION: This exam was performed according to the departmental dose-optimization program which includes automated exposure control, adjustment of the mA and/or kV according to patient size and/or use of iterative reconstruction technique. COMPARISON:  None available FINDINGS: CT HEAD FINDINGS Brain: No intracranial hemorrhage, mass effect, or evidence of acute infarct. No hydrocephalus. No extra-axial fluid collection. Generalized cerebral atrophy. Ill-defined hypoattenuation within the cerebral white matter is nonspecific but consistent with chronic small vessel ischemic disease. Vascular: No hyperdense vessel. Intracranial arterial calcification. Skull: No fracture or focal lesion. Sinuses/Orbits: No acute finding. Paranasal sinuses and mastoid air cells are well aerated. Other: None. CT CERVICAL SPINE FINDINGS Alignment: Loss of normal lordosis is likely positional/chronic. Slight anterolisthesis C3 on C4. Skull base and vertebrae: No acute fracture. No primary  bone lesion or focal pathologic process. Soft tissues and spinal canal: Choose 1 Disc levels: Multilevel spondylosis, endplate spurring, and disc space height loss. Disc space height loss is greatest at C6-C7 where it is moderate-advanced. Multilevel facet arthropathy. Posterior disc osteophyte complexes cause mild effacement of the ventral thecal sac at C4-C5, C5-C6, and C6-C7. Uncovertebral spurring and facet arthropathy cause multilevel advanced neural foraminal narrowing on the right at C3-C4, bilaterally at C5-C6 and bilaterally at C6-C7. Upper chest: Advanced emphysema. Other: None. IMPRESSION: 1. No acute intracranial abnormality. Generalized atrophy and small vessel white matter disease. 2. No acute fracture in the cervical spine. Multilevel degenerative spondylosis. Electronically Signed   By: Placido Sou M.D.   On: 12/01/2021 01:47   CT Cervical Spine Wo Contrast  Result Date: 12/01/2021 CLINICAL DATA:  Fall on Eliquis with trauma to the right side of the head; vomited 3 times since falling EXAM: CT HEAD WITHOUT CONTRAST CT CERVICAL SPINE WITHOUT CONTRAST TECHNIQUE: Multidetector CT imaging of the head and cervical spine was performed following the standard protocol without intravenous contrast. Multiplanar CT image reconstructions of the cervical spine were also generated. RADIATION DOSE REDUCTION: This exam was performed according to the departmental dose-optimization program which includes automated exposure control, adjustment of the mA and/or kV according to patient size and/or use of iterative reconstruction technique. COMPARISON:  None available FINDINGS: CT HEAD FINDINGS Brain: No intracranial hemorrhage, mass effect, or evidence of acute infarct. No hydrocephalus. No extra-axial fluid collection. Generalized cerebral atrophy. Ill-defined hypoattenuation within the cerebral white matter is nonspecific but consistent with chronic small vessel ischemic disease. Vascular: No hyperdense vessel.  Intracranial arterial calcification. Skull: No fracture or focal lesion. Sinuses/Orbits: No acute finding. Paranasal sinuses and mastoid air cells are well aerated. Other: None. CT CERVICAL SPINE FINDINGS Alignment: Loss of normal lordosis is likely positional/chronic. Slight anterolisthesis C3 on C4. Skull base and vertebrae: No acute fracture. No primary bone lesion or focal pathologic process. Soft tissues and spinal canal: Choose 1 Disc levels: Multilevel spondylosis, endplate spurring, and disc space height loss. Disc space height loss is greatest at C6-C7 where it is moderate-advanced. Multilevel facet arthropathy. Posterior disc osteophyte complexes cause mild effacement of the ventral thecal sac at  C4-C5, C5-C6, and C6-C7. Uncovertebral spurring and facet arthropathy cause multilevel advanced neural foraminal narrowing on the right at C3-C4, bilaterally at C5-C6 and bilaterally at C6-C7. Upper chest: Advanced emphysema. Other: None. IMPRESSION: 1. No acute intracranial abnormality. Generalized atrophy and small vessel white matter disease. 2. No acute fracture in the cervical spine. Multilevel degenerative spondylosis. Electronically Signed   By: Placido Sou M.D.   On: 12/01/2021 01:47    Procedures Procedures    Medications Ordered in ED Medications  ondansetron (ZOFRAN) injection 4 mg (has no administration in time range)  lactated ringers bolus 500 mL (0 mLs Intravenous Stopped 12/01/21 0303)  Tdap (BOOSTRIX) injection 0.5 mL (0.5 mLs Intramuscular Given 12/01/21 0151)  acetaminophen (TYLENOL) tablet 1,000 mg (1,000 mg Oral Given 12/01/21 0148)    ED Course/ Medical Decision Making/ A&P                           Medical Decision Making Amount and/or Complexity of Data Reviewed Labs: ordered. Radiology: ordered.  Risk OTC drugs. Prescription drug management.  No lacerations requiring repair. Does have multiple skin tears, a couple of which likely need debrided but should be fine  with steri strips for wound protection. Discussed the likely prolonged course of healing related to same.  Imaging reassuring.  No more emesis.  Appears well.  Urinalysis did show microscopic hematuria.  He states he did have an episode of fleeting left lower quadrant abdominal pain yesterday and has a history of kidney stones however no persistent pain.  This could be a passed stone or stone that is in transit however without persistent pain or other symptoms we will not pursue imaging at this time.  We will use Tylenol as needed at home if pain returns.  We will follow-up with PCP in a week to make sure that hematuria improved and if not to worked up further.   Final Clinical Impression(s) / ED Diagnoses Final diagnoses:  Fall, initial encounter  Multiple skin tears  Hematuria, unspecified type    Rx / DC Orders ED Discharge Orders     None         Lailie Smead, Corene Cornea, MD 12/01/21 [phone]

## 2021-12-01 NOTE — ED Notes (Signed)
ED Provider at bedside. 

## 2021-12-01 NOTE — ED Notes (Signed)
Patient transported to CT 

## 2021-12-01 NOTE — ED Notes (Addendum)
Wound on right arm cleaned, steri strips applied, dressed with non-adherent bandage.

## 2021-12-01 NOTE — ED Triage Notes (Signed)
Pt fell tonight while trying to go to bathroom. Pt hit R side of head (above R eye) on wood floor as well as sustained a skin tear to R forearm. Pt is on Eliquis and family reports pt has vomited 3 times since falling.

## 2021-12-04 DIAGNOSIS — J9610 Chronic respiratory failure, unspecified whether with hypoxia or hypercapnia: Secondary | ICD-10-CM | POA: Diagnosis not present

## 2021-12-09 DIAGNOSIS — J9610 Chronic respiratory failure, unspecified whether with hypoxia or hypercapnia: Secondary | ICD-10-CM | POA: Diagnosis not present

## 2021-12-09 DIAGNOSIS — J449 Chronic obstructive pulmonary disease, unspecified: Secondary | ICD-10-CM | POA: Diagnosis not present

## 2021-12-09 DIAGNOSIS — M159 Polyosteoarthritis, unspecified: Secondary | ICD-10-CM | POA: Diagnosis not present

## 2021-12-09 DIAGNOSIS — I10 Essential (primary) hypertension: Secondary | ICD-10-CM | POA: Diagnosis not present

## 2021-12-11 DIAGNOSIS — R319 Hematuria, unspecified: Secondary | ICD-10-CM | POA: Diagnosis not present

## 2021-12-21 ENCOUNTER — Other Ambulatory Visit: Payer: Self-pay

## 2021-12-21 ENCOUNTER — Emergency Department (HOSPITAL_COMMUNITY): Payer: Medicare Other

## 2021-12-21 ENCOUNTER — Emergency Department (HOSPITAL_COMMUNITY)
Admission: EM | Admit: 2021-12-21 | Discharge: 2021-12-22 | Disposition: A | Discharge status: Home / Self Care | Payer: Medicare Other | Attending: Emergency Medicine | Admitting: Emergency Medicine

## 2021-12-21 ENCOUNTER — Encounter (HOSPITAL_COMMUNITY): Payer: Self-pay | Admitting: *Deleted

## 2021-12-21 DIAGNOSIS — Z79899 Other long term (current) drug therapy: Secondary | ICD-10-CM | POA: Diagnosis not present

## 2021-12-21 DIAGNOSIS — R079 Chest pain, unspecified: Secondary | ICD-10-CM | POA: Diagnosis not present

## 2021-12-21 DIAGNOSIS — R531 Weakness: Secondary | ICD-10-CM | POA: Diagnosis not present

## 2021-12-21 DIAGNOSIS — Z20822 Contact with and (suspected) exposure to covid-19: Secondary | ICD-10-CM | POA: Insufficient documentation

## 2021-12-21 DIAGNOSIS — J449 Chronic obstructive pulmonary disease, unspecified: Secondary | ICD-10-CM | POA: Diagnosis not present

## 2021-12-21 DIAGNOSIS — R059 Cough, unspecified: Secondary | ICD-10-CM

## 2021-12-21 DIAGNOSIS — R0981 Nasal congestion: Secondary | ICD-10-CM | POA: Diagnosis not present

## 2021-12-21 DIAGNOSIS — R0789 Other chest pain: Secondary | ICD-10-CM | POA: Diagnosis not present

## 2021-12-21 DIAGNOSIS — Z7951 Long term (current) use of inhaled steroids: Secondary | ICD-10-CM | POA: Insufficient documentation

## 2021-12-21 DIAGNOSIS — Z7901 Long term (current) use of anticoagulants: Secondary | ICD-10-CM | POA: Insufficient documentation

## 2021-12-21 DIAGNOSIS — J069 Acute upper respiratory infection, unspecified: Secondary | ICD-10-CM | POA: Diagnosis not present

## 2021-12-21 DIAGNOSIS — E039 Hypothyroidism, unspecified: Secondary | ICD-10-CM | POA: Diagnosis not present

## 2021-12-21 DIAGNOSIS — J90 Pleural effusion, not elsewhere classified: Secondary | ICD-10-CM | POA: Diagnosis not present

## 2021-12-21 LAB — CBC
HCT: 38.7 % — ABNORMAL LOW (ref 39.0–52.0)
Hemoglobin: 11.9 g/dL — ABNORMAL LOW (ref 13.0–17.0)
MCH: 31.7 pg (ref 26.0–34.0)
MCHC: 30.7 g/dL (ref 30.0–36.0)
MCV: 103.2 fL — ABNORMAL HIGH (ref 80.0–100.0)
Platelets: 263 10*3/uL (ref 150–400)
RBC: 3.75 MIL/uL — ABNORMAL LOW (ref 4.22–5.81)
RDW: 13.9 % (ref 11.5–15.5)
WBC: 11.2 10*3/uL — ABNORMAL HIGH (ref 4.0–10.5)
nRBC: 0 % (ref 0.0–0.2)

## 2021-12-21 LAB — URINALYSIS, ROUTINE W REFLEX MICROSCOPIC
Bacteria, UA: NONE SEEN
Bilirubin Urine: NEGATIVE
Glucose, UA: NEGATIVE mg/dL
Ketones, ur: NEGATIVE mg/dL
Leukocytes,Ua: NEGATIVE
Nitrite: NEGATIVE
Protein, ur: NEGATIVE mg/dL
Specific Gravity, Urine: 1.012 (ref 1.005–1.030)
pH: 7 (ref 5.0–8.0)

## 2021-12-21 LAB — BASIC METABOLIC PANEL
Anion gap: 9 (ref 5–15)
BUN: 11 mg/dL (ref 8–23)
CO2: 40 mmol/L — ABNORMAL HIGH (ref 22–32)
Calcium: 9.2 mg/dL (ref 8.9–10.3)
Chloride: 91 mmol/L — ABNORMAL LOW (ref 98–111)
Creatinine, Ser: 0.68 mg/dL (ref 0.61–1.24)
GFR, Estimated: >60 mL/min (ref >60)
Glucose, Bld: 121 mg/dL — ABNORMAL HIGH (ref 70–99)
Potassium: 4.3 mmol/L (ref 3.5–5.1)
Sodium: 140 mmol/L (ref 135–145)

## 2021-12-21 LAB — TROPONIN I (HIGH SENSITIVITY): Troponin I (High Sensitivity): 7 ng/L (ref <18)

## 2021-12-21 NOTE — ED Provider Notes (Addendum)
[facility] EMERGENCY DEPARTMENT Provider Note   CSN: [id number] Arrival date & time: 12/21/21  2135     History  Chief Complaint  Patient presents with   Fever    David Stein is a 76 y.o. male.  Patient is a 76 year old male with past medical history of COPD on home oxygen, atrial flutter, hypothyroidism, hyperlipidemia.  Patient presenting today with complaints of cough and congestion that has been ongoing for the past 10 days.  He was initially treated by his primary doctor with Zithromax, but this did not help.  He was then started on doxycycline which seemed to help for a few days, then symptoms have returned.  He describes chest congestion and intermittent productive cough.  He reports fevers at home, but arrives here afebrile.  He denies abdominal pain, urinary complaints, or other symptoms that would explain the fever.  He denies ill contacts.  The history is provided by the patient.       Home Medications Prior to Admission medications   Medication Sig Start Date End Date Taking? Authorizing Provider  acetaminophen (TYLENOL) 500 MG tablet Take 500 mg by mouth every 6 (six) hours as needed for mild pain.    [provider]  albuterol (PROVENTIL) (2.5 MG/3ML) 0.083% nebulizer solution Take 3 mLs (2.5 mg total) by nebulization every 4 (four) hours as needed for wheezing or shortness of breath. 09/20/21   Roxan Hockey, MD  ALPRAZolam Duanne Moron) 0.5 MG tablet Take 0.5 mg by mouth every 8 (eight) hours as needed for anxiety. 06/11/20   [provider]  amiodarone (PACERONE) 200 MG tablet TAKE (1/2) A TABLET BY MOUTH ONCE A DAY. 10/03/21   Evans Lance, MD  apixaban (ELIQUIS) 5 MG TABS tablet Take 1 tablet (5 mg total) by mouth 2 (two) times daily. 12/14/15   Laqueta Linden, MD  budesonide-formoterol [facility]) 160-4.5 MCG/ACT inhaler Inhale 2 puffs into the lungs 2 (two) times daily. 09/20/21   Roxan Hockey, MD  Cholecalciferol (D3 2000) 50 MCG (2000  UT) CAPS Take 1 capsule by mouth daily.    [provider]  diltiazem (CARDIZEM CD) 240 MG 24 hr capsule Take 1 capsule (240 mg total) by mouth daily. 09/20/21   Roxan Hockey, MD  Elastic Bandages & Supports (M-4 KNEE HIGH STOCKINGS) MISC 1 each by Does not apply route as directed. 08/02/20   Arnoldo Lenis, MD  fluticasone (FLONASE) 50 MCG/ACT nasal spray Place 1 spray into both nostrils daily.    [provider]  furosemide (LASIX) 20 MG tablet Take 1 tablet (20 mg total) by mouth daily. 09/20/21   Roxan Hockey, MD  guaiFENesin (MUCINEX) 600 MG 12 hr tablet Take 1 tablet (600 mg total) by mouth 2 (two) times daily. 09/20/21   Roxan Hockey, MD  levothyroxine (SYNTHROID, LEVOTHROID) 50 MCG tablet Take 50 mcg by mouth daily before breakfast.    [provider]  mirtazapine (REMERON) 15 MG tablet Take 15 mg by mouth at bedtime. 06/25/21   [provider]  Multiple Vitamin (MULTIVITAMIN WITH MINERALS) TABS tablet Take 1 tablet by mouth daily.    [provider]  OXYGEN Inhale 3 L into the lungs daily.    [provider]  predniSONE (DELTASONE) 10 MG tablet Take 10 mg by mouth daily as needed (COPD). 08/30/21   [provider]  PROAIR HFA 108 (90 Base) MCG/ACT inhaler Inhale 1 puff into the lungs every 6 (six) hours as needed for wheezing or shortness  of breath. 09/20/21   Roxan Hockey, MD  rosuvastatin (CRESTOR) 10 MG tablet Take 10 mg by mouth daily. 06/12/20   [provider]  Tiotropium Bromide Monohydrate (SPIRIVA RESPIMAT) 2.5 MCG/ACT AERS Inhale 2 puffs into the lungs daily. 09/20/21   Roxan Hockey, MD  traZODone (DESYREL) 50 MG tablet Take 50 mg by mouth at bedtime.    [provider]      Allergies    Codeine    Review of Systems   Review of Systems  All other systems reviewed and are negative.   Physical Exam Updated Vital Signs BP 127/72 (BP Location: Right Arm)   Pulse 75   Temp 98.5 F  (36.9 C) (Oral)   Resp 18   Ht '6\' 1"'$  (1.854 m)   Wt 89.8 kg   SpO2 100%   BMI 26.12 kg/m  Physical Exam Vitals and nursing note reviewed.  Constitutional:      General: He is not in acute distress.    Appearance: He is well-developed. He is not diaphoretic.  HENT:     Head: Normocephalic and atraumatic.  Cardiovascular:     Rate and Rhythm: Normal rate and regular rhythm.     Heart sounds: No murmur heard.    No friction rub.  Pulmonary:     Effort: Pulmonary effort is normal. No respiratory distress.     Breath sounds: Normal breath sounds. No wheezing or rales.  Abdominal:     General: Bowel sounds are normal. There is no distension.     Palpations: Abdomen is soft.     Tenderness: There is no abdominal tenderness.  Musculoskeletal:        General: Normal range of motion.     Cervical back: Normal range of motion and neck supple.     Right lower leg: Edema present.     Left lower leg: Edema present.  Skin:    General: Skin is warm and dry.  Neurological:     Mental Status: He is alert and oriented to person, place, and time.     Coordination: Coordination normal.     ED Results / Procedures / Treatments   Labs (all labs ordered are listed, but only abnormal results are displayed) Labs Reviewed  BASIC METABOLIC PANEL - Abnormal; Notable for the following components:      Result Value   Chloride 91 (*)    CO2 40 (*)    Glucose, Bld 121 (*)    All other components within normal limits  CBC - Abnormal; Notable for the following components:   WBC 11.2 (*)    RBC 3.75 (*)    Hemoglobin 11.9 (*)    HCT 38.7 (*)    MCV 103.2 (*)    All other components within normal limits  SARS CORONAVIRUS 2 BY RT PCR  URINALYSIS, ROUTINE W REFLEX MICROSCOPIC  TROPONIN I (HIGH SENSITIVITY)  TROPONIN I (HIGH SENSITIVITY)    EKG EKG Interpretation  Date/Time:  Saturday December 21 2021 23:49:42 EDT Ventricular Rate:  72 PR Interval:  198 QRS Duration: 163 QT  Interval:  436 QTC Calculation: 478 R Axis:   92 Text Interpretation: Sinus rhythm Atrial premature complexes RBBB and LPFB Baseline wander in lead(s) V4 Confirmed by Veryl Speak [phone]) on 12/21/2021 11:58:47 PM  Radiology DG Chest 2 View  Result Date: 12/21/2021 CLINICAL DATA:  Chest pain; fever; weakness EXAM: CHEST - 2 VIEW COMPARISON:  Radiographs 12/01/2021 FINDINGS: No focal consolidation. Chronic bronchitic changes. Small left-greater-than-right pleural  effusions. Stable cardiomediastinal silhouette. Aortic atherosclerotic calcification. No acute osseous abnormality. IMPRESSION: COPD. Small bilateral pleural effusions. Electronically Signed   By: Placido Sou M.D.   On: 12/21/2021 22:35    Procedures Procedures    Medications Ordered in ED Medications - No data to display  ED Course/ Medical Decision Making/ A&P  Patient is a 76 year old male with past medical history as per HPI presenting with cough and congestion.  He seemed to improve with doxycycline, however symptoms are returning.  Work-up today initiated including laboratory studies, EKG, and chest x-[place].  These are all essentially unremarkable.  He does have small pleural effusions, the etiology of which I am uncertain.  Patient with no hypoxia and appears clinically well.  I feel as though he can safely be discharged with outpatient follow-up.  Final Clinical Impression(s) / ED Diagnoses Final diagnoses:  None    Rx / DC Orders ED Discharge Orders     None         Veryl Speak, MD 12/22/21 3491    Veryl Speak, MD 12/22/21 [phone]

## 2021-12-21 NOTE — ED Triage Notes (Signed)
Pt took tylenol about an hour ago

## 2021-12-21 NOTE — ED Triage Notes (Signed)
Pt with recent antibiotics. Pt with fever off and on all day.  Pt c/o generalized weakness. Pt with chest tightness x 2-3 weeks. Pt is currently on home O2 at 4 L/[place].

## 2021-12-22 LAB — SARS CORONAVIRUS 2 BY RT PCR: SARS Coronavirus 2 by RT PCR: NEGATIVE

## 2021-12-22 LAB — TROPONIN I (HIGH SENSITIVITY): Troponin I (High Sensitivity): 6 ng/L (ref <18)

## 2021-12-22 NOTE — Discharge Instructions (Signed)
Continue doxycycline as previously prescribed.  Follow-up with your primary doctor if not improving in the next few days.

## 2021-12-31 ENCOUNTER — Ambulatory Visit: Payer: Medicare Other | Admitting: Physician Assistant

## 2021-12-31 ENCOUNTER — Ambulatory Visit (HOSPITAL_COMMUNITY): Admission: RE | Admit: 2021-12-31 | Payer: Medicare Other | Source: Ambulatory Visit

## 2021-12-31 VITALS — BP 106/66 | HR 87 | Wt 189.0 lb

## 2021-12-31 DIAGNOSIS — R8271 Bacteriuria: Secondary | ICD-10-CM

## 2021-12-31 DIAGNOSIS — R339 Retention of urine, unspecified: Secondary | ICD-10-CM

## 2021-12-31 DIAGNOSIS — R509 Fever, unspecified: Secondary | ICD-10-CM

## 2021-12-31 DIAGNOSIS — R3915 Urgency of urination: Secondary | ICD-10-CM

## 2021-12-31 DIAGNOSIS — R310 Gross hematuria: Secondary | ICD-10-CM | POA: Diagnosis not present

## 2021-12-31 LAB — URINALYSIS, ROUTINE W REFLEX MICROSCOPIC
Bilirubin, UA: NEGATIVE
Glucose, UA: NEGATIVE
Ketones, UA: NEGATIVE
Leukocytes,UA: NEGATIVE
Nitrite, UA: POSITIVE — AB
Protein,UA: NEGATIVE
Specific Gravity, UA: 1.015 (ref 1.005–1.030)
Urobilinogen, Ur: 0.2 mg/dL (ref 0.2–1.0)
pH, UA: 7 (ref 5.0–7.5)

## 2021-12-31 LAB — MICROSCOPIC EXAMINATION

## 2021-12-31 LAB — BLADDER SCAN AMB NON-IMAGING: Scan Result: 329

## 2021-12-31 MED ORDER — SULFAMETHOXAZOLE-TRIMETHOPRIM 800-160 MG PO TABS: 1.0000 | ORAL_TABLET | Freq: Two times a day (BID) | ORAL | 0 refills | Status: DC

## 2021-12-31 MED ORDER — TAMSULOSIN HCL 0.4 MG PO CAPS: 0.4000 mg | ORAL_CAPSULE | Freq: Every day | ORAL | 1 refills | Status: DC

## 2021-12-31 NOTE — Progress Notes (Signed)
12/31/2021 9:49 AM   David Stein [birth year] [id number]   Assessment:  1. Gross hematuria - Urinalysis, Routine w reflex microscopic - Urine Culture - CT HEMATURIA WORKUP - CT HEMATURIA WORKUP  2. Bacteriuria - Urine Culture - CT HEMATURIA WORKUP - CT HEMATURIA WORKUP  3. Fever, unspecified fever cause  4. Urinary urgency - BLADDER SCAN AMB NON-IMAGING  5. Incomplete bladder emptying    Plan: Today I had a discussion with the patient regarding the findings of gross and microscopic hematuria including the implications and differential diagnoses associated with it.  I also discussed recommendations for further evaluation including the rationale for upper tract imaging and cystoscopy.  I discussed the nature of these procedures including potential risk and complications.  The patient expressed an understanding of these issues. Urine culture ordered.  Will initiate treatment of suspected UTI with Bactrim DS.  We will adjust therapy if indicated by culture results. Follow-up after CT for cystoscopic exam. Discussed starting a prescription for Flomax to help with his symptoms of urgency and frequency and incomplete emptying, but the patient already has issues with dizziness and hypotension.   Chief Complaint:   Referring provider: Redmond School, MD [address] [place],  [place] [address]   History of Present Illness:  David Stein is a 76 y.o. year old male with multiple medical problems as noted below who is seen in consultation from Redmond School, MD  for evaluation of 1 month history of gross hematuria.  His daughter is with him today.  Patient reports symptoms began after he sustained a fall and was evaluated in the emergency department.  Gross hematuria has now been intermittent with associated frequency, intermittency, urgency, weakness of stream, and significant postvoid dribble.Marland Kitchen  He felt mild LUTS approximately 2 months ago with significant increase in  symptoms since the hematuria started.  He reports intermittent low-grade fevers and fatigue persistent over the past month.  He has been treated with Zithromax Z-PAK, and 2 rounds of doxycycline for respiratory complaints which also improved the patient's urinary symptoms.  He has been off doxycycline for approximately 1 week and symptoms seem to be worsening again..  Today, the patient denies burning, dysuria.  No abdominal pain and no nausea or vomiting.  He denies hematuria in the past, reports no history of urinary stones and no history of UTIs.  Nocturia x2 which is normal for him. The patient is anticoagulated with Eliquis.  He has a past history of smoking as well. UA = 11-30 RBCs, moderate bacteria, nitrite positive PVR= 329 mL, after bladder scan the patient felt the urge to void with 250 mL of output.  True PVR = 79 mL IPSS = 24   ED Eval 12/21/21 Patient is a 76 year old male with past medical history of COPD on home oxygen, atrial flutter, hypothyroidism, hyperlipidemia.  Patient presenting today with complaints of cough and congestion that has been ongoing for the past 10 days.  He was initially treated by his primary doctor with Zithromax, but this did not help.  He was then started on doxycycline which seemed to help for a few days, then symptoms have returned.  He describes chest congestion and intermittent productive cough.  He reports fevers at home, but arrives here afebrile.  He denies abdominal pain, urinary complaints, or other symptoms that would explain the fever.  He denies ill contacts.   The history is provided by the patient.    ED Eval 12/01/21 David Stein is a 76  y.o. male.   76 yo M who presents to the ED today after a fall.  Patient is normally in a wheelchair secondary to deconditioning, blood thinners and multiple medications causing him to be weak.  He does get out of wheelchair to perform ADLs.  Walks with a walker sometimes.  States that tonight he got out and was  going to the bathroom when he felt very wobbly and then fell down.  Hit his head and has some skin tears.  No significant bony pain but then had 3 episodes of nonbloody nonbilious emesis within the last hour.  Is on Eliquis for A-fib.  I reviewed the records from previously appears that in April he had an echo that showed severely reduced right-sided cardiac function but normal EF.  Has COPD is on 4 L baseline.   Portions of the above documentation were copied from a prior visit for review purposes only. Medical records including notes, lab results, and imaging studies reviewed during pt OV.  Past Medical History:  Past Medical History:  Diagnosis Date   Arthritis    Atrial flutter ([place])    COPD (chronic obstructive pulmonary disease) ([place])    Kidney stones    Oxygen dependent    2.5L   Restless leg syndrome    Shortness of breath dyspnea     Past Surgical History:  Past Surgical History:  Procedure Laterality Date   CATARACT EXTRACTION W/PHACO Right 01/31/2014   Procedure: CATARACT EXTRACTION PHACO AND INTRAOCULAR LENS PLACEMENT RIGHT EYE CDE=6.90;  Surgeon: Elta Guadeloupe T. Gershon Crane, MD;  Location: [facility];  Service: Ophthalmology;  Laterality: Right;   CATARACT EXTRACTION W/PHACO Left 02/14/2014   Procedure: CATARACT EXTRACTION PHACO AND INTRAOCULAR LENS PLACEMENT; CDE:  8.34;  Surgeon: Elta Guadeloupe T. Gershon Crane, MD;  Location: [facility];  Service: Ophthalmology;  Laterality: Left;   KNEE SURGERY Left     Allergies:  Allergies  Allergen Reactions   Codeine Other (See Comments)    headache    Family History:  Family History  Problem Relation Age of Onset   Rheumatic fever Father    Alzheimer's disease Mother    Stroke Sister     Social History:  Social History   Tobacco Use   Smoking status: Former    Packs/day: 0.25    Years: 55.00    Total pack years: 13.75    Types: Cigarettes    Quit date: 12/12/2015    Years since quitting: 6.0   Smokeless tobacco: Never  Vaping Use   Vaping  Use: Never used  Substance Use Topics   Alcohol use: No   Drug use: No    Review of symptoms:  Constitutional: Negative for unexplained weight loss, positive for fevers, chills, night sweats ENT:  Negative for nose bleeds, sinus pain, painful swallowing CV:  Negative for chest pain, positive shortness of breath Resp: Chronic congestion and shortness of breath GI:  Negative for nausea, vomiting, diarrhea, bloody stools GU:  Positives noted in HPI; negative for incontinence Neuro:  Negative for seizures Endocrine:  Negative for polydipsia, polyuria, symptoms of hypoglycemia (dizziness, hunger, sweating) Hematologic:  Negative for anemia, purpura, petechia  Physical Exam: BP 106/66   Pulse 87   Wt 189 lb (85.7 kg)   BMI 24.94 kg/m   Constitutional:  Alert and oriented, No acute distress.  Mobilizing in wheelchair HEENT: NCAT, moist mucus membranes.  Trachea midline, no masses. Cardiovascular: Regular rate and rhythm without murmur, rub, or gallops Respiratory: Normal respiratory effort, clear to auscultation  bilaterally GI: Abdomen is soft, nontender, nondistended, no abdominal masses BACK:  Non-tender to palpation.  No CVAT Skin: No obvious rashes, warm, dry, intact Neurologic: Alert and oriented Psychiatric: Appropriate. Normal mood and affect.  Laboratory Data: No results found for this or any previous visit (from the past 24 hour(s)).  Lab Results  Component Value Date   WBC 11.2 (H) 12/21/2021   HGB 11.9 (L) 12/21/2021   HCT 38.7 (L) 12/21/2021   MCV 103.2 (H) 12/21/2021   PLT 263 12/21/2021    Lab Results  Component Value Date   CREATININE 0.68 12/21/2021     Urinalysis    Component Value Date/Time   COLORURINE YELLOW 12/21/2021 2148   APPEARANCEUR CLEAR 12/21/2021 2148   LABSPEC 1.012 12/21/2021 2148   PHURINE 7.0 12/21/2021 2148   GLUCOSEU NEGATIVE 12/21/2021 2148   HGBUR SMALL (A) 12/21/2021 2148   BILIRUBINUR NEGATIVE 12/21/2021 2148   [place]  NEGATIVE 12/21/2021 2148   PROTEINUR NEGATIVE 12/21/2021 2148   NITRITE NEGATIVE 12/21/2021 2148   LEUKOCYTESUR NEGATIVE 12/21/2021 2148    Lab Results  Component Value Date   BACTERIA NONE SEEN 12/21/2021    Pertinent Imaging: No results found for this or any previous visit.  No results found for this or any previous visit.     [place], Berneice Heinrich, PA-C [facility]

## 2022-01-03 LAB — URINE CULTURE

## 2022-01-04 ENCOUNTER — Ambulatory Visit (HOSPITAL_BASED_OUTPATIENT_CLINIC_OR_DEPARTMENT_OTHER): Payer: Medicare Other

## 2022-01-04 ENCOUNTER — Ambulatory Visit (HOSPITAL_BASED_OUTPATIENT_CLINIC_OR_DEPARTMENT_OTHER)
Admission: RE | Admit: 2022-01-04 | Discharge: 2022-01-04 | Disposition: A | Discharge status: Home / Self Care | Payer: Medicare Other | Source: Ambulatory Visit | Attending: Physician Assistant | Admitting: Physician Assistant

## 2022-01-04 DIAGNOSIS — R8271 Bacteriuria: Secondary | ICD-10-CM | POA: Insufficient documentation

## 2022-01-04 DIAGNOSIS — N134 Hydroureter: Secondary | ICD-10-CM | POA: Diagnosis not present

## 2022-01-04 DIAGNOSIS — R310 Gross hematuria: Secondary | ICD-10-CM | POA: Diagnosis not present

## 2022-01-04 DIAGNOSIS — N200 Calculus of kidney: Secondary | ICD-10-CM | POA: Diagnosis not present

## 2022-01-04 MED ORDER — IOHEXOL 300 MG/ML  SOLN
100.0000 mL | Freq: Once | INTRAMUSCULAR | Status: AC | PRN
  Administered 2022-01-04: 100 mL via INTRAVENOUS

## 2022-01-06 ENCOUNTER — Telehealth: Payer: Self-pay

## 2022-01-06 NOTE — Telephone Encounter (Signed)
-----   Message from [place], [place] sent at 01/06/2022 11:55 AM EDT ----- Please let pt know his CT indicates a tiny stone in the ureter at the right UVJ. He also has several stones in the left kidney which are causing no obstructions at this time. He needs to increase his fluid intake and keep his follow up appt. As there is a UVJ stone and his urine cx is positive, he needs to continue on the antibx until his FU visit next week for cysto to complete his gross hematuria evaluation. I will send in Rx for Rapaflo and refill of Bactrim now. He may drop by the office for a strainer to hopefully catch the stone if he passes it.  ----- Message ----- From: Interface, Rad Results In Sent: 01/04/2022   3:06 PM EDT To: Reynaldo Minium, PA-C

## 2022-01-06 NOTE — Telephone Encounter (Signed)
Made patient aware that CT indicates a tiny stone at the right UVJ and he has several stones in the left kidney which are causing no obstructions at this time. Made patient aware that his urine cx is positive and he will need to continue his antibx until his next FU visit next for his cysto. Made patient aware that [facility] sent him in Rapaflo and refill of Bactrim and he can stop by the office at anytime to pick up a strainer to catch his stones. Patient voiced understanding.

## 2022-01-10 DIAGNOSIS — J449 Chronic obstructive pulmonary disease, unspecified: Secondary | ICD-10-CM | POA: Diagnosis not present

## 2022-01-10 DIAGNOSIS — M1991 Primary osteoarthritis, unspecified site: Secondary | ICD-10-CM | POA: Diagnosis not present

## 2022-01-10 DIAGNOSIS — J9610 Chronic respiratory failure, unspecified whether with hypoxia or hypercapnia: Secondary | ICD-10-CM | POA: Diagnosis not present

## 2022-01-10 DIAGNOSIS — I10 Essential (primary) hypertension: Secondary | ICD-10-CM | POA: Diagnosis not present

## 2022-01-15 ENCOUNTER — Ambulatory Visit: Payer: Medicare Other | Admitting: Urology

## 2022-01-15 VITALS — BP 98/56 | HR 88

## 2022-01-15 DIAGNOSIS — R310 Gross hematuria: Secondary | ICD-10-CM | POA: Diagnosis not present

## 2022-01-15 DIAGNOSIS — N200 Calculus of kidney: Secondary | ICD-10-CM | POA: Diagnosis not present

## 2022-01-15 DIAGNOSIS — R3912 Poor urinary stream: Secondary | ICD-10-CM

## 2022-01-15 LAB — URINALYSIS, ROUTINE W REFLEX MICROSCOPIC
Bilirubin, UA: NEGATIVE
Glucose, UA: NEGATIVE
Ketones, UA: NEGATIVE
Leukocytes,UA: NEGATIVE
Nitrite, UA: NEGATIVE
Protein,UA: NEGATIVE
Specific Gravity, UA: 1.015 (ref 1.005–1.030)
Urobilinogen, Ur: 1 mg/dL (ref 0.2–1.0)
pH, UA: 6 (ref 5.0–7.5)

## 2022-01-15 LAB — MICROSCOPIC EXAMINATION
Bacteria, UA: NONE SEEN
RBC, Urine: >30 /hpf — AB (ref 0–2)

## 2022-01-15 MED ORDER — TAMSULOSIN HCL 0.4 MG PO CAPS: 0.4000 mg | ORAL_CAPSULE | Freq: Every day | ORAL | 11 refills | Status: DC

## 2022-01-15 NOTE — Progress Notes (Signed)
01/15/2022 11:52 AM   David Stein David Stein Aug 21, 1945 [id number]  Referring provider: Redmond School, MD [address] [name],  [place] [id number]  Followup hematuria   HPI: Mr David Stein is a 76yo here for followup for gross hematuria. CT hematuria 01/04/2022 shows a right distal ureteral 2-59m calculus and left renal calculi. He has had stone events in the past but it has been over 5 years. He denies nay right flank pain. He does not know if he passed his calculus. He has not had gross hematuria in 1 week. NO significant LUTS.    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 SGershon Crane MD;  Location: [facility];  Service: Ophthalmology;  Laterality: Right;   CATARACT EXTRACTION W/PHACO Left 02/14/2014   Procedure: CATARACT EXTRACTION PHACO AND INTRAOCULAR LENS PLACEMENT; CDE:  8.34;  Surgeon: David GuadeloupeT. SGershon Crane MD;  Location: [facility];  Service: Ophthalmology;  Laterality: Left;   KNEE SURGERY Left     Home Medications:  Allergies as of 01/15/2022       Reactions   Codeine Other (See Comments)   headache        Medication List        Accurate as of January 15, 2022 11:52 AM. If you have any questions, ask your nurse or doctor.          acetaminophen 500 MG tablet Commonly known as: TYLENOL Take 500 mg by mouth every 6 (six) hours as needed for mild pain.   albuterol (2.5 MG/3ML) 0.083% nebulizer solution Commonly known as: PROVENTIL Take 3 mLs (2.5 mg total) by nebulization every 4 (four) hours as needed for wheezing or shortness of breath.   ProAir HFA  108 (90 Base) MCG/ACT inhaler Generic drug: albuterol Inhale 1 puff into the lungs every 6 (six) hours as needed for wheezing or shortness of breath.   ALPRAZolam 0.5 MG tablet Commonly known as: XANAX Take 0.5 mg by mouth every 8 (eight) hours as needed for anxiety.   amiodarone 200 MG tablet Commonly known as: PACERONE TAKE (1/2) A TABLET BY MOUTH ONCE A DAY.   apixaban 5 MG Tabs tablet Commonly known as: ELIQUIS Take 1 tablet (5 mg total) by mouth 2 (two) times daily.   budesonide-formoterol 160-4.5 MCG/ACT inhaler Commonly known as: SYMBICORT Inhale 2 puffs into the lungs 2 (two) times daily.   D3 2000 50 MCG (2000 UT) Caps Generic drug: Cholecalciferol Take 1 capsule by mouth daily.   diltiazem 240 MG 24 hr capsule Commonly known as: CARDIZEM CD Take 1 capsule (240 mg total) by mouth daily.   fluticasone 50 MCG/ACT nasal spray Commonly known as: FLONASE Place 1 spray into both nostrils daily.   furosemide 20 MG tablet Commonly known as: LASIX Take 1 tablet (20 mg total) by mouth daily.   guaiFENesin 600 MG 12 hr tablet Commonly known as: MUCINEX Take 1 tablet (600 mg total) by mouth 2 (two) times daily.   HYDROcodone-acetaminophen 10-325 MG tablet Commonly known as: NORCO Take 1 tablet by mouth 4 (four) times  daily as needed.   levothyroxine 50 MCG tablet Commonly known as: SYNTHROID Take 50 mcg by mouth daily before breakfast.   M-4 Knee High Stockings Misc 1 each by Does not apply route as directed.   mirtazapine 15 MG tablet Commonly known as: REMERON Take 15 mg by mouth at bedtime.   multivitamin with minerals Tabs tablet Take 1 tablet by mouth daily.   OXYGEN Inhale 3 L into the lungs daily.   predniSONE 10 MG tablet Commonly known as: DELTASONE Take 10 mg by mouth daily as needed (COPD).   rosuvastatin 10 MG tablet Commonly known as: CRESTOR Take 10 mg by mouth daily.   Spiriva Respimat 2.5 MCG/ACT Aers Generic drug: Tiotropium Bromide  Monohydrate Inhale 2 puffs into the lungs daily.   sulfamethoxazole-trimethoprim 800-160 MG tablet Commonly known as: BACTRIM DS Take 1 tablet by mouth 2 (two) times daily.   traZODone 50 MG tablet Commonly known as: DESYREL Take 50 mg by mouth at bedtime.        Allergies:  Allergies  Allergen Reactions   Codeine Other (See Comments)    headache    Family History: Family History  Problem Relation Age of Onset   Rheumatic fever Father    Alzheimer's disease Mother    Stroke Sister     Social History:  reports that he quit smoking about 6 years ago. His smoking use included cigarettes. He has a 13.75 pack-year smoking history. He has never used smokeless tobacco. He reports that he does not drink alcohol and does not use drugs.  ROS: All other review of systems were reviewed and are negative except what is noted above in HPI  Physical Exam: BP (!) 98/56   Pulse 88   Constitutional:  Alert and oriented, No acute distress. HEENT: [place] AT, moist mucus membranes.  Trachea midline, no masses. Cardiovascular: No clubbing, cyanosis, or edema. Respiratory: Normal respiratory effort, no increased work of breathing. GI: Abdomen is soft, nontender, nondistended, no abdominal masses GU: No CVA tenderness.  Lymph: No cervical or inguinal lymphadenopathy. Skin: No rashes, bruises or suspicious lesions. Neurologic: Grossly intact, no focal deficits, moving all 4 extremities. Psychiatric: Normal mood and affect.  Laboratory Data: Lab Results  Component Value Date   WBC 11.2 (H) 12/21/2021   HGB 11.9 (L) 12/21/2021   HCT 38.7 (L) 12/21/2021   MCV 103.2 (H) 12/21/2021   PLT 263 12/21/2021    Lab Results  Component Value Date   CREATININE 0.68 12/21/2021    No results found for: "PSA"  No results found for: "TESTOSTERONE"  No results found for: "HGBA1C"  Urinalysis    Component Value Date/Time   COLORURINE YELLOW 12/21/2021 2148   APPEARANCEUR Cloudy (A) 12/31/2021  0956   LABSPEC 1.012 12/21/2021 2148   PHURINE 7.0 12/21/2021 2148   GLUCOSEU Negative 12/31/2021 0956   HGBUR SMALL (A) 12/21/2021 2148   BILIRUBINUR Negative 12/31/2021 0956   KETONESUR NEGATIVE 12/21/2021 2148   PROTEINUR Negative 12/31/2021 0956   PROTEINUR NEGATIVE 12/21/2021 2148   NITRITE Positive (A) 12/31/2021 0956   NITRITE NEGATIVE 12/21/2021 2148   LEUKOCYTESUR Negative 12/31/2021 0956   LEUKOCYTESUR NEGATIVE 12/21/2021 2148    Lab Results  Component Value Date   LABMICR See below: 12/31/2021   WBCUA 0-5 12/31/2021   LABEPIT 0-10 12/31/2021   BACTERIA Moderate (A) 12/31/2021    Pertinent Imaging: CT 01/04/2022: Images reviewed and discussed with the patient  No results found for this or any previous visit.  No  results found for this or any previous visit.  No results found for this or any previous visit.  No results found for this or any previous visit.  No results found for this or any previous visit.  No valid procedures specified. Results for orders placed in visit on 12/31/21  CT HEMATURIA WORKUP  Narrative CLINICAL DATA:  Gross hematuria.  Flank and abdominal pain.  EXAM: CT ABDOMEN AND PELVIS WITHOUT AND WITH CONTRAST  TECHNIQUE: Multidetector CT imaging of the abdomen and pelvis was performed following the standard protocol before and following the bolus administration of intravenous contrast.  RADIATION DOSE REDUCTION: This exam was performed according to the departmental dose-optimization program which includes automated exposure control, adjustment of the mA and/or kV according to patient size and/or use of iterative reconstruction technique.  CONTRAST:  150m OMNIPAQUE IOHEXOL 300 MG/ML  SOLN  COMPARISON:  None.  FINDINGS: Lower chest: Mild bronchial wall thickening noted in the lower lungs bilaterally with dependent atelectasis in both lower lobes. Small focus of collapse/consolidative opacity in the lingula is  probably atelectasis or scarring.  Hepatobiliary: No suspicious focal abnormality in the liver on this study without intravenous contrast. There is no evidence for gallstones, gallbladder wall thickening, or pericholecystic fluid. No intrahepatic or extrahepatic biliary dilation.  Pancreas: No focal mass lesion. No dilatation of the main duct. No intraparenchymal cyst. No peripancreatic edema.  Spleen: No splenomegaly. No focal mass lesion.  Adrenals/Urinary Tract: No adrenal nodule or mass. 3 cm water density lesion interpolar right kidney is compatible with a cyst. No stones are seen in the right kidney. Mild right hydroureteronephrosis noted secondary to a 2 mm stone in the right UVJ (66/2). 2 mm nonobstructing stone noted lower pole left kidney with 7 mm nonobstructing stone identified upper pole left kidney. 2 additional punctate stones are seen in the upper interpolar left kidney. No left ureteral stones. No secondary changes in the left kidney or ureter. No evidence for stones free in the bladder lumen.  Stomach/Bowel: Stomach is unremarkable. No gastric wall thickening. No evidence of outlet obstruction. Duodenum is normally positioned as is the ligament of Treitz. No small bowel wall thickening. No small bowel dilatation. The terminal ileum is normal. The appendix is normal. No gross colonic mass. No colonic wall thickening.  Vascular/Lymphatic: There is moderate atherosclerotic calcification of the abdominal aorta without aneurysm. There is no gastrohepatic or hepatoduodenal ligament lymphadenopathy. No retroperitoneal or mesenteric lymphadenopathy. No pelvic sidewall lymphadenopathy.  Reproductive: The prostate gland and seminal vesicles are unremarkable.  Other: No intraperitoneal free fluid.  Musculoskeletal: No worrisome lytic or sclerotic osseous abnormality.  IMPRESSION: 1. 2 mm right UVJ stone with mild fullness of the right intrarenal collecting system  and ureter and subtle right periureteric edema. 2. Nonobstructing left renal stones. 3. Mild bronchial wall thickening in the lower lungs bilaterally with dependent atelectasis in both lower lobes. 4. Small focus of collapse/consolidative opacity in the lingula is probably atelectasis or scarring. 5.  Aortic Atherosclerosis (ICD10-I70.0).   Electronically Signed By: EMisty StanleyM.D. On: 01/04/2022 15:04  No results found for this or any previous visit.   Assessment & Plan:    1. Gross hematuria -Likely related to 26mright distal ureteral calculus.  -RTC 6 weeks with renal [place] Urinalysis, Routine w reflex microscopic  2. Weak urinary stream -Continue flomax 0.'4mg'$  daily   No follow-ups on file.  PaNicolette BangMD  [facility] [place]

## 2022-01-15 NOTE — Patient Instructions (Signed)

## 2022-01-28 ENCOUNTER — Other Ambulatory Visit (HOSPITAL_COMMUNITY): Payer: Medicare Other

## 2022-02-05 DIAGNOSIS — G4730 Sleep apnea, unspecified: Secondary | ICD-10-CM | POA: Diagnosis not present

## 2022-02-11 ENCOUNTER — Ambulatory Visit (HOSPITAL_COMMUNITY): Payer: Medicare Other

## 2022-02-12 ENCOUNTER — Ambulatory Visit (HOSPITAL_COMMUNITY)
Admission: RE | Admit: 2022-02-12 | Discharge: 2022-02-12 | Disposition: A | Discharge status: Home / Self Care | Payer: Medicare Other | Source: Ambulatory Visit | Attending: Urology | Admitting: Urology

## 2022-02-12 ENCOUNTER — Ambulatory Visit (HOSPITAL_COMMUNITY): Payer: Medicare Other

## 2022-02-12 ENCOUNTER — Encounter (HOSPITAL_COMMUNITY): Payer: Self-pay

## 2022-02-12 DIAGNOSIS — N200 Calculus of kidney: Secondary | ICD-10-CM | POA: Diagnosis not present

## 2022-02-12 DIAGNOSIS — N281 Cyst of kidney, acquired: Secondary | ICD-10-CM | POA: Diagnosis not present

## 2022-02-25 ENCOUNTER — Ambulatory Visit: Payer: Medicare Other | Admitting: Urology

## 2022-02-27 ENCOUNTER — Other Ambulatory Visit: Payer: Self-pay | Admitting: Cardiology

## 2022-03-14 ENCOUNTER — Ambulatory Visit: Payer: Medicare Other | Admitting: Urology

## 2022-03-22 DIAGNOSIS — J9610 Chronic respiratory failure, unspecified whether with hypoxia or hypercapnia: Secondary | ICD-10-CM | POA: Diagnosis not present

## 2022-03-22 DIAGNOSIS — J449 Chronic obstructive pulmonary disease, unspecified: Secondary | ICD-10-CM | POA: Diagnosis not present

## 2022-04-02 ENCOUNTER — Ambulatory Visit: Payer: BC Managed Care – PPO | Admitting: Urology

## 2022-04-02 ENCOUNTER — Other Ambulatory Visit: Payer: Self-pay | Admitting: Internal Medicine

## 2022-04-02 VITALS — BP 127/72 | HR 76

## 2022-04-02 DIAGNOSIS — R310 Gross hematuria: Secondary | ICD-10-CM | POA: Diagnosis not present

## 2022-04-02 DIAGNOSIS — N200 Calculus of kidney: Secondary | ICD-10-CM

## 2022-04-02 DIAGNOSIS — R8289 Other abnormal findings on cytological and histological examination of urine: Secondary | ICD-10-CM | POA: Diagnosis not present

## 2022-04-02 MED ORDER — TAMSULOSIN HCL 0.4 MG PO CAPS: 0.4000 mg | ORAL_CAPSULE | Freq: Two times a day (BID) | ORAL | 11 refills | Status: DC

## 2022-04-02 NOTE — Patient Instructions (Signed)

## 2022-04-02 NOTE — Progress Notes (Signed)
04/02/2022 3:15 PM   David Stein David Stein October 25, 1945 [id number]  Referring provider: Redmond School, MD [address] [place],  [place] [address]  Followup nephrolithiasis and gross hematuria   HPI: David Stein is a 77yo here for followup for gross hematuria and nephrolithiasis. No gross hematuria since last visit. Renal US shows bilateral renal calculi and no hydronephrosis. He has morning weak urinary stream which bothers him. IPSS 18 QOL 3 on flomax 0.'4mg'$ . He drinks 12oz of green tea daily and 16oz of water daily.     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 Gershon Crane, MD;  Location: [facility];  Service: Ophthalmology;  Laterality: Right;   CATARACT EXTRACTION W/PHACO Left 02/14/2014   Procedure: CATARACT EXTRACTION PHACO AND INTRAOCULAR LENS PLACEMENT; CDE:  8.34;  Surgeon: Elta Guadeloupe T. Gershon Crane, MD;  Location: [facility];  Service: Ophthalmology;  Laterality: Left;   KNEE SURGERY Left     Home Medications:  Allergies as of 04/02/2022       Reactions   Codeine Other (See Comments)   headache        Medication List        Accurate as of April 02, 2022  3:15 PM. If you have any questions, ask your nurse or doctor.          acetaminophen 500 MG tablet Commonly known as: TYLENOL Take 500 mg by mouth every 6 (six) hours as needed for mild pain.   albuterol (2.5 MG/3ML) 0.083% nebulizer solution Commonly known as: PROVENTIL Take 3 mLs (2.5 mg total) by nebulization every 4 (four) hours as needed for wheezing or shortness of breath.   ProAir HFA 108 (90  Base) MCG/ACT inhaler Generic drug: albuterol Inhale 1 puff into the lungs every 6 (six) hours as needed for wheezing or shortness of breath.   ALPRAZolam 0.5 MG tablet Commonly known as: XANAX Take 0.5 mg by mouth every 8 (eight) hours as needed for anxiety.   amiodarone 200 MG tablet Commonly known as: PACERONE Take 1 tablet (200 mg total) by mouth daily. What changed: See the new instructions. Changed by: Cristopher Peru, MD   apixaban 5 MG Tabs tablet Commonly known as: ELIQUIS Take 1 tablet (5 mg total) by mouth 2 (two) times daily.   budesonide-formoterol 160-4.5 MCG/ACT inhaler Commonly known as: SYMBICORT Inhale 2 puffs into the lungs 2 (two) times daily.   D3 2000 50 MCG (2000 UT) Caps Generic drug: Cholecalciferol Take 1 capsule by mouth daily.   diltiazem 240 MG 24 hr capsule Commonly known as: CARDIZEM CD Take 1 capsule (240 mg total) by mouth daily.   fluticasone 50 MCG/ACT nasal spray Commonly known as: FLONASE Place 1 spray into both nostrils daily.   furosemide 20 MG tablet Commonly known as: LASIX TAKE ONE TABLET BY MOUTH DAILY.MAY TAKE AN ADDITIONAL TABLET AS NEEDED.   guaiFENesin 600 MG 12 hr tablet Commonly known as: MUCINEX Take 1 tablet (600 mg total) by mouth 2 (two) times daily.   HYDROcodone-acetaminophen 10-325 MG tablet Commonly known as: NORCO  Take 1 tablet by mouth 4 (four) times daily as needed.   levothyroxine 50 MCG tablet Commonly known as: SYNTHROID Take 50 mcg by mouth daily before breakfast.   M-4 Knee High Stockings Misc 1 each by Does not apply route as directed.   mirtazapine 15 MG tablet Commonly known as: REMERON Take 15 mg by mouth at bedtime.   multivitamin with minerals Tabs tablet Take 1 tablet by mouth daily.   OXYGEN Inhale 3 L into the lungs daily.   predniSONE 10 MG tablet Commonly known as: DELTASONE Take 10 mg by mouth daily as needed (COPD).   rosuvastatin 10 MG tablet Commonly known as: CRESTOR Take  10 mg by mouth daily.   Spiriva Respimat 2.5 MCG/ACT Aers Generic drug: Tiotropium Bromide Monohydrate Inhale 2 puffs into the lungs daily.   sulfamethoxazole-trimethoprim 800-160 MG tablet Commonly known as: BACTRIM DS Take 1 tablet by mouth 2 (two) times daily.   tamsulosin 0.4 MG Caps capsule Commonly known as: FLOMAX Take 1 capsule (0.4 mg total) by mouth daily after supper.   traZODone 50 MG tablet Commonly known as: DESYREL Take 50 mg by mouth at bedtime.        Allergies:  Allergies  Allergen Reactions   Codeine Other (See Comments)    headache    Family History: Family History  Problem Relation Age of Onset   Rheumatic fever Father    Alzheimer's disease Mother    Stroke Sister     Social History:  reports that he quit smoking about 6 years ago. His smoking use included cigarettes. He has a 13.75 pack-year smoking history. He has never used smokeless tobacco. He reports that he does not drink alcohol and does not use drugs.  ROS: All other review of systems were reviewed and are negative except what is noted above in HPI  Physical Exam: BP 127/72   Pulse 76   Constitutional:  Alert and oriented, No acute distress. HEENT: [place] AT, moist mucus membranes.  Trachea midline, no masses. Cardiovascular: No clubbing, cyanosis, or edema. Respiratory: Normal respiratory effort, no increased work of breathing. GI: Abdomen is soft, nontender, nondistended, no abdominal masses GU: No CVA tenderness.  Lymph: No cervical or inguinal lymphadenopathy. Skin: No rashes, bruises or suspicious lesions. Neurologic: Grossly intact, no focal deficits, moving all 4 extremities. Psychiatric: Normal mood and affect.  Laboratory Data: Lab Results  Component Value Date   WBC 11.2 (H) 12/21/2021   HGB 11.9 (L) 12/21/2021   HCT 38.7 (L) 12/21/2021   MCV 103.2 (H) 12/21/2021   PLT 263 12/21/2021    Lab Results  Component Value Date   CREATININE 0.68 12/21/2021    No  results found for: "PSA"  No results found for: "TESTOSTERONE"  No results found for: "HGBA1C"  Urinalysis    Component Value Date/Time   COLORURINE YELLOW 12/21/2021 2148   APPEARANCEUR Cloudy (A) 01/15/2022 1125   LABSPEC 1.012 12/21/2021 2148   PHURINE 7.0 12/21/2021 2148   GLUCOSEU Negative 01/15/2022 1125   HGBUR SMALL (A) 12/21/2021 2148   BILIRUBINUR Negative 01/15/2022 [place] 12/21/2021 2148   PROTEINUR Negative 01/15/2022 [place] 12/21/2021 2148   NITRITE Negative 01/15/2022 1125   NITRITE NEGATIVE 12/21/2021 2148   LEUKOCYTESUR Negative 01/15/2022 1125   LEUKOCYTESUR NEGATIVE 12/21/2021 2148    Lab Results  Component Value Date   LABMICR See below: 01/15/2022   WBCUA 0-5 01/15/2022   LABEPIT 0-10 01/15/2022   BACTERIA None  seen 01/15/2022    Pertinent Imaging: Renal US 02/12/2022: Images reviewed and discussed with the patient  No results found for this or any previous visit.  No results found for this or any previous visit.  No results found for this or any previous visit.  No results found for this or any previous visit.  Results for orders placed during the hospital encounter of 02/12/22  Ultrasound renal complete  Narrative CLINICAL DATA:  Nephrolithiasis  EXAM: RENAL / URINARY TRACT ULTRASOUND COMPLETE  COMPARISON:  CT abdomen pelvis 01/04/2022  FINDINGS: Right Kidney:  Renal measurements: 11.1 x 5.8 x 7.5 cm = volume: 255 mL. Normal cortical thickness and echogenicity. Question 5 mm calculus mid kidney. Exophytic cyst inferior pole 3.8 x 3.4 x 3.8 cm, simple features; no follow-up imaging recommended. No additional mass or hydronephrosis.  Left Kidney:  Renal measurements: 11.5 x 6.5 x 7.3 cm = volume: 284 mL. Normal cortical thickness and echogenicity. No mass or hydronephrosis. Shadowing echogenic foci at upper pole suspect multiple calculi up to 9 mm diameter; however, recent CT only shows  small calculi at upper pole LEFT kidney.  Bladder:  Appears normal for degree of bladder distention.  Other:  N/A  IMPRESSION: Probable BILATERAL nonobstructing renal calculi.  No additional significant renal sonographic abnormalities.   Electronically Signed By: Lavonia Dana M.D. On: 02/12/2022 15:56  No valid procedures specified. Results for orders placed in visit on 12/31/21  CT HEMATURIA WORKUP  Narrative CLINICAL DATA:  Gross hematuria.  Flank and abdominal pain.  EXAM: CT ABDOMEN AND PELVIS WITHOUT AND WITH CONTRAST  TECHNIQUE: Multidetector CT imaging of the abdomen and pelvis was performed following the standard protocol before and following the bolus administration of intravenous contrast.  RADIATION DOSE REDUCTION: This exam was performed according to the departmental dose-optimization program which includes automated exposure control, adjustment of the mA and/or kV according to patient size and/or use of iterative reconstruction technique.  CONTRAST:  156m OMNIPAQUE IOHEXOL 300 MG/ML  SOLN  COMPARISON:  None.  FINDINGS: Lower chest: Mild bronchial wall thickening noted in the lower lungs bilaterally with dependent atelectasis in both lower lobes. Small focus of collapse/consolidative opacity in the lingula is probably atelectasis or scarring.  Hepatobiliary: No suspicious focal abnormality in the liver on this study without intravenous contrast. There is no evidence for gallstones, gallbladder wall thickening, or pericholecystic fluid. No intrahepatic or extrahepatic biliary dilation.  Pancreas: No focal mass lesion. No dilatation of the main duct. No intraparenchymal cyst. No peripancreatic edema.  Spleen: No splenomegaly. No focal mass lesion.  Adrenals/Urinary Tract: No adrenal nodule or mass. 3 cm water density lesion interpolar right kidney is compatible with a cyst. No stones are seen in the right kidney. Mild  right hydroureteronephrosis noted secondary to a 2 mm stone in the right UVJ (66/2). 2 mm nonobstructing stone noted lower pole left kidney with 7 mm nonobstructing stone identified upper pole left kidney. 2 additional punctate stones are seen in the upper interpolar left kidney. No left ureteral stones. No secondary changes in the left kidney or ureter. No evidence for stones free in the bladder lumen.  Stomach/Bowel: Stomach is unremarkable. No gastric wall thickening. No evidence of outlet obstruction. Duodenum is normally positioned as is the ligament of Treitz. No small bowel wall thickening. No small bowel dilatation. The terminal ileum is normal. The appendix is normal. No gross colonic mass. No colonic wall thickening.  Vascular/Lymphatic: There is moderate atherosclerotic calcification of the abdominal aorta without  aneurysm. There is no gastrohepatic or hepatoduodenal ligament lymphadenopathy. No retroperitoneal or mesenteric lymphadenopathy. No pelvic sidewall lymphadenopathy.  Reproductive: The prostate gland and seminal vesicles are unremarkable.  Other: No intraperitoneal free fluid.  Musculoskeletal: No worrisome lytic or sclerotic osseous abnormality.  IMPRESSION: 1. 2 mm right UVJ stone with mild fullness of the right intrarenal collecting system and ureter and subtle right periureteric edema. 2. Nonobstructing left renal stones. 3. Mild bronchial wall thickening in the lower lungs bilaterally with dependent atelectasis in both lower lobes. 4. Small focus of collapse/consolidative opacity in the lingula is probably atelectasis or scarring. 5.  Aortic Atherosclerosis (ICD10-I70.0).   Electronically Signed By: Misty Stanley M.D. On: 01/04/2022 15:04  No results found for this or any previous visit.   Assessment & Plan:    1. Nephrolithiasis -followup 6 months with a renal US and KUB - Urinalysis, Routine w reflex microscopic  2. Gross  hematuria -urine for cytology. Followup 6 months - Cytology, urine   No follow-ups on file.  Nicolette Bang, MD  [facility] [place]

## 2022-04-03 ENCOUNTER — Other Ambulatory Visit: Payer: Self-pay | Admitting: Internal Medicine

## 2022-04-03 LAB — CYTOLOGY, URINE

## 2022-04-03 LAB — URINALYSIS, ROUTINE W REFLEX MICROSCOPIC
Bilirubin, UA: NEGATIVE
Glucose, UA: NEGATIVE
Ketones, UA: NEGATIVE
Leukocytes,UA: NEGATIVE
Nitrite, UA: NEGATIVE
Specific Gravity, UA: 1.02 (ref 1.005–1.030)
Urobilinogen, Ur: 2 mg/dL — ABNORMAL HIGH (ref 0.2–1.0)
pH, UA: 6.5 (ref 5.0–7.5)

## 2022-04-03 LAB — MICROSCOPIC EXAMINATION
Bacteria, UA: NONE SEEN
RBC, Urine: >30 /hpf — AB (ref 0–2)

## 2022-04-07 ENCOUNTER — Other Ambulatory Visit: Payer: Self-pay | Admitting: Internal Medicine

## 2022-04-08 ENCOUNTER — Encounter: Payer: Self-pay | Admitting: Urology

## 2022-04-08 DIAGNOSIS — G4733 Obstructive sleep apnea (adult) (pediatric): Secondary | ICD-10-CM | POA: Diagnosis not present

## 2022-04-15 ENCOUNTER — Telehealth: Payer: Self-pay

## 2022-04-15 NOTE — Telephone Encounter (Signed)
-----  Message from Cleon Gustin, MD sent at 04/15/2022  9:00 AM EST ----- Please get a FISH ----- Message ----- From: Iris Pert, LPN Sent: 10/02/1973   5:09 PM EST To: Cleon Gustin, MD  Please review

## 2022-04-15 NOTE — Telephone Encounter (Signed)
Patient called and made aware of  urine FISH test needed. Patient states he will have someone pick up urine cups and will bring the urine back to the office within the hour.

## 2022-04-18 ENCOUNTER — Telehealth: Payer: Self-pay

## 2022-04-18 NOTE — Telephone Encounter (Signed)
Spoke to pharmacy, they will fill the rx and notify patient.

## 2022-04-18 NOTE — Telephone Encounter (Signed)
Pharmacy will not fill the new dosage of tamsulosin (FLOMAX) 0.4 MG CAPS capsule. Patient's Rx was refilled for the 30 per refill. Please advise.

## 2022-04-21 ENCOUNTER — Other Ambulatory Visit: Payer: BC Managed Care – PPO

## 2022-04-21 DIAGNOSIS — R82998 Other abnormal findings in urine: Secondary | ICD-10-CM | POA: Diagnosis not present

## 2022-04-22 DIAGNOSIS — J449 Chronic obstructive pulmonary disease, unspecified: Secondary | ICD-10-CM | POA: Diagnosis not present

## 2022-04-23 ENCOUNTER — Telehealth: Payer: Self-pay

## 2022-04-23 NOTE — Telephone Encounter (Signed)
Matt from [facility] called and left a vm advising he had some questions regarding the collection of the patients urine. He can be reached at the number listed below.  [phone]  Thank you!

## 2022-05-08 DIAGNOSIS — G4733 Obstructive sleep apnea (adult) (pediatric): Secondary | ICD-10-CM | POA: Diagnosis not present

## 2022-05-08 NOTE — Telephone Encounter (Signed)
Calling to discuss the most recent urine test (UA).  Please call   [phone]

## 2022-05-09 NOTE — Telephone Encounter (Signed)
Return call to patient and he is aware that once Dr. Alyson Ingles review results, someone will reach back out to him with results. Patient voiced understanding

## 2022-05-12 ENCOUNTER — Encounter: Payer: Self-pay | Admitting: Urology

## 2022-05-13 DIAGNOSIS — J9610 Chronic respiratory failure, unspecified whether with hypoxia or hypercapnia: Secondary | ICD-10-CM | POA: Diagnosis not present

## 2022-05-13 DIAGNOSIS — I10 Essential (primary) hypertension: Secondary | ICD-10-CM | POA: Diagnosis not present

## 2022-05-13 DIAGNOSIS — J441 Chronic obstructive pulmonary disease with (acute) exacerbation: Secondary | ICD-10-CM | POA: Diagnosis not present

## 2022-05-13 DIAGNOSIS — I4891 Unspecified atrial fibrillation: Secondary | ICD-10-CM | POA: Diagnosis not present

## 2022-05-16 DIAGNOSIS — G4733 Obstructive sleep apnea (adult) (pediatric): Secondary | ICD-10-CM | POA: Diagnosis not present

## 2022-05-22 DIAGNOSIS — J9610 Chronic respiratory failure, unspecified whether with hypoxia or hypercapnia: Secondary | ICD-10-CM | POA: Diagnosis not present

## 2022-05-22 DIAGNOSIS — J449 Chronic obstructive pulmonary disease, unspecified: Secondary | ICD-10-CM | POA: Diagnosis not present

## 2022-06-06 DIAGNOSIS — G4733 Obstructive sleep apnea (adult) (pediatric): Secondary | ICD-10-CM | POA: Diagnosis not present

## 2022-06-21 DIAGNOSIS — J9610 Chronic respiratory failure, unspecified whether with hypoxia or hypercapnia: Secondary | ICD-10-CM | POA: Diagnosis not present

## 2022-06-21 DIAGNOSIS — J449 Chronic obstructive pulmonary disease, unspecified: Secondary | ICD-10-CM | POA: Diagnosis not present

## 2022-06-27 ENCOUNTER — Telehealth: Payer: Self-pay | Admitting: Internal Medicine

## 2022-06-27 NOTE — Telephone Encounter (Signed)
Pt would like a callback regarding whether upcoming appt on 4/9 is able to be virtual due to his health issues and having trouble getting to the office. Please advise

## 2022-06-30 NOTE — Telephone Encounter (Signed)
Patient notified and verbalized thankfulness to provider for allowing switch     Patient Consent for Virtual Visit  [id number]}   David Stein has provided verbal consent on 06/30/2022 for a virtual visit (video or telephone).   CONSENT FOR VIRTUAL VISIT FOR:  David Stein  By participating in this virtual visit I agree to the following:  I hereby voluntarily request, consent and authorize [facility] and its employed or contracted physicians, physician assistants, nurse practitioners or other licensed health care professionals (the Practitioner), to provide me with telemedicine health care services (the "Services") as deemed necessary by the treating Practitioner. I acknowledge and consent to receive the Services by the Practitioner via telemedicine. I understand that the telemedicine visit will involve communicating with the Practitioner through live audiovisual communication technology and the disclosure of certain medical information by electronic transmission. I acknowledge that I have been given the opportunity to request an in-person assessment or other available alternative prior to the telemedicine visit and am voluntarily participating in the telemedicine visit.  I understand that I have the right to withhold or withdraw my consent to the use of telemedicine in the course of my care at any time, without affecting my right to future care or treatment, and that the Practitioner or I may terminate the telemedicine visit at any time. I understand that I have the right to inspect all information obtained and/or recorded in the course of the telemedicine visit and may receive copies of available information for a reasonable fee.  I understand that some of the potential risks of receiving the Services via telemedicine include:  Delay or interruption in medical evaluation due to technological equipment failure or disruption; Information transmitted may not be sufficient (e.g. poor  resolution of images) to allow for appropriate medical decision making by the Practitioner; and/or  In rare instances, security protocols could fail, causing a breach of personal health information.  Furthermore, I acknowledge that it is my responsibility to provide information about my medical history, conditions and care that is complete and accurate to the best of my ability. I acknowledge that Practitioner's advice, recommendations, and/or decision may be based on factors not within their control, such as incomplete or inaccurate data provided by me or distortions of diagnostic images or specimens that may result from electronic transmissions. I understand that the practice of medicine is not an exact science and that Practitioner makes no warranties or guarantees regarding treatment outcomes. I acknowledge that a copy of this consent can be made available to me via my patient portal [facility] MyChart), or I can request a printed copy by calling the office of [facility].    I understand that my insurance will be billed for this visit.   I have read or had this consent read to me. I understand the contents of this consent, which adequately explains the benefits and risks of the Services being provided via telemedicine.  I have been provided ample opportunity to ask questions regarding this consent and the Services and have had my questions answered to my satisfaction. I give my informed consent for the services to be provided through the use of telemedicine in my medical care

## 2022-07-01 ENCOUNTER — Telehealth: Payer: Self-pay

## 2022-07-01 ENCOUNTER — Encounter: Payer: Self-pay | Admitting: Internal Medicine

## 2022-07-01 ENCOUNTER — Ambulatory Visit: Payer: Medicare Other | Attending: Internal Medicine | Admitting: Internal Medicine

## 2022-07-01 DIAGNOSIS — I4892 Unspecified atrial flutter: Secondary | ICD-10-CM | POA: Diagnosis not present

## 2022-07-01 MED ORDER — DILTIAZEM HCL ER COATED BEADS 240 MG PO CP24: 240.0000 mg | ORAL_CAPSULE | Freq: Every day | ORAL | 3 refills | Status: DC

## 2022-07-01 NOTE — Progress Notes (Signed)
Electrophysiology TeleHealth Note   Date:  07/01/2022   ID:  David Stein, DOB 06/07/1945, MRN [id number]  Location: patient's home  Provider location: [address], [place]  Evaluation Performed: Follow-up visit  PCP:  Elfredia NevinsFusco, Lawrence, MD  Cardiologist:  Dina RichBranch, Jonathan, MD  Electrophysiologist:  Dr Ladona Ridgelaylor  Chief Complaint:  "I am doing ok."  History of Present Illness:    David Stein is a 77 y.o. male who presents via audio/video conferencing for a telehealth visit today.  He is a pleasant 77 yo man with a h/o oxygen dependent COPD, atrial flutter, who remains with chronic dyspnea. The patient has had a problem with gross hemturia. He also had a fall in the summer. He remains on low dose amiodarone.  Past Medical History:  Diagnosis Date   Arthritis    Atrial flutter    COPD (chronic obstructive pulmonary disease)    Kidney stones    Oxygen dependent    2.5L   Restless leg syndrome    Shortness of breath dyspnea     Past Surgical History:  Procedure Laterality Date   CATARACT EXTRACTION W/PHACO Right 01/31/2014   Procedure: CATARACT EXTRACTION PHACO AND INTRAOCULAR LENS PLACEMENT RIGHT EYE CDE=6.90;  Surgeon: Loraine LericheMark T. Nile RiggsShapiro, MD;  Location: [facility];  Service: Ophthalmology;  Laterality: Right;   CATARACT EXTRACTION W/PHACO Left 02/14/2014   Procedure: CATARACT EXTRACTION PHACO AND INTRAOCULAR LENS PLACEMENT; CDE:  8.34;  Surgeon: Loraine LericheMark T. Nile RiggsShapiro, MD;  Location: [facility];  Service: Ophthalmology;  Laterality: Left;   KNEE SURGERY Left     Current Outpatient Medications  Medication Sig Dispense Refill   acetaminophen (TYLENOL) 500 MG tablet Take 500 mg by mouth every 6 (six) hours as needed for mild pain.     albuterol (PROVENTIL) (2.5 MG/3ML) 0.083% nebulizer solution Take 3 mLs (2.5 mg total) by nebulization every 4 (four) hours as needed for wheezing or shortness of breath. 75 mL 3   ALPRAZolam (XANAX) 0.5 MG tablet Take 0.5 mg by mouth every 8 (eight)  hours as needed for anxiety.     amiodarone (PACERONE) 200 MG tablet Take 1 tablet (200 mg total) by mouth daily. 45 tablet 0   apixaban (ELIQUIS) 5 MG TABS tablet Take 1 tablet (5 mg total) by mouth 2 (two) times daily. 60 tablet 0   budesonide-formoterol (SYMBICORT) 160-4.5 MCG/ACT inhaler Inhale 2 puffs into the lungs 2 (two) times daily. 10.2 g 11   Cholecalciferol (D3 2000) 50 MCG (2000 UT) CAPS Take 1 capsule by mouth daily.     Elastic Bandages & Supports (M-4 KNEE HIGH STOCKINGS) MISC 1 each by Does not apply route as directed. 1 each 0   furosemide (LASIX) 20 MG tablet TAKE ONE TABLET BY MOUTH DAILY.MAY TAKE AN ADDITIONAL TABLET AS NEEDED. 180 tablet 0   guaiFENesin (MUCINEX) 600 MG 12 hr tablet Take 1 tablet (600 mg total) by mouth 2 (two) times daily. 60 tablet 0   HYDROcodone-acetaminophen (NORCO) 10-325 MG tablet Take 1 tablet by mouth 4 (four) times daily as needed.     levothyroxine (SYNTHROID, LEVOTHROID) 50 MCG tablet Take 50 mcg by mouth daily before breakfast.     mirtazapine (REMERON) 15 MG tablet Take 15 mg by mouth at bedtime.     Multiple Vitamin (MULTIVITAMIN WITH MINERALS) TABS tablet Take 1 tablet by mouth daily.     OXYGEN Inhale 3 L into the lungs daily.     predniSONE (DELTASONE) 10 MG  tablet Take 10 mg by mouth daily as needed (COPD).     PROAIR HFA 108 (90 Base) MCG/ACT inhaler Inhale 1 puff into the lungs every 6 (six) hours as needed for wheezing or shortness of breath. 18 g 3   rosuvastatin (CRESTOR) 10 MG tablet Take 10 mg by mouth daily.     sulfamethoxazole-trimethoprim (BACTRIM DS) 800-160 MG tablet Take 1 tablet by mouth 2 (two) times daily. 20 tablet 0   tamsulosin (FLOMAX) 0.4 MG CAPS capsule Take 1 capsule (0.4 mg total) by mouth 2 (two) times daily. 60 capsule 11   Tiotropium Bromide Monohydrate (SPIRIVA RESPIMAT) 2.5 MCG/ACT AERS Inhale 2 puffs into the lungs daily. 4 g 2   traZODone (DESYREL) 50 MG tablet Take 50 mg by mouth at bedtime.     diltiazem  (CARDIZEM CD) 240 MG 24 hr capsule Take 1 capsule (240 mg total) by mouth daily. 90 capsule 3   fluticasone (FLONASE) 50 MCG/ACT nasal spray Place 1 spray into both nostrils daily. (Patient not taking: Reported on 07/01/2022)     No current facility-administered medications for this visit.    Allergies:   Codeine   Social History:  The patient  reports that he quit smoking about 6 years ago. His smoking use included cigarettes. He has a 13.75 pack-year smoking history. He has never used smokeless tobacco. He reports that he does not drink alcohol and does not use drugs.   Family History:  The patient's  family history includes Alzheimer's disease in his mother; Rheumatic fever in his father; Stroke in his sister.   ROS:  Please see the history of present illness.   All other systems are personally reviewed and negative.    Exam:    Vital Signs:  BP (!) 124/59   Pulse 76   Ht 6\' 2"  (1.88 m)   Wt 205 lb (93 kg)   BMI 26.32 kg/m   Well appearing, alert and conversant, regular work of breathing,  good skin color Eyes- anicteric, neuro- grossly intact, skin- no apparent rash or lesions or cyanosis, mouth- oral mucosa is pink   Labs/Other Tests and Data Reviewed:    Recent Labs: 09/18/2021: B Natriuretic Peptide 99.0 12/01/2021: ALT 20 12/21/2021: BUN 11; Creatinine, Ser 0.68; Hemoglobin 11.9; Platelets 263; Potassium 4.3; Sodium 140   Wt Readings from Last 3 Encounters:  07/01/22 205 lb (93 kg)  12/31/21 189 lb (85.7 kg)  12/21/21 198 lb (89.8 kg)     Other studies personally reviewed: none   ASSESSMENT & PLAN:    1.  Atrial fib/flutter - he appears to be maintaining NSR and he will continue his current meds for now. No change.  2. Coags - He has not had recent bleeding on eliquis.   COVID 19 screen The patient denies symptoms of COVID 19 at this time.  The importance of social distancing was discussed today.  Follow-up:  1 year Next remote: n/a  Current medicines are  reviewed at length with the patient today.   The patient does not have concerns regarding his medicines.  The following changes were made today:    Labs/ tests ordered today include: none No orders of the defined types were placed in this encounter.    Patient Risk:  after full review of this patients clinical status, I feel that they are at moderate risk at this time.  Today, I have spent 15 minutes with the patient with telehealth technology discussing all of the above .  Signed, Lewayne Bunting, MD  07/01/2022 6:07 PM     [facility] [address] [place] [address] [phone] (office) [phone] (fax)

## 2022-07-01 NOTE — Patient Instructions (Signed)
Medication Instructions:  Your physician recommends that you continue on your current medications as directed. Please refer to the Current Medication list given to you today.  *If you need a refill on your cardiac medications before your next appointment, please call your pharmacy*   Lab Work: NONE   If you have labs (blood work) drawn today and your tests are completely normal, you will receive your results only by: MyChart Message (if you have MyChart) OR A paper copy in the mail If you have any lab test that is abnormal or we need to change your treatment, we will call you to review the results.   Testing/Procedures: NONE    Follow-Up: At [facility], you and your health needs are our priority.  As part of our continuing mission to provide you with exceptional heart care, we have created designated Provider Care Teams.  These Care Teams include your primary Cardiologist (physician) and Advanced Practice Providers (APPs -  Physician Assistants and Nurse Practitioners) who all work together to provide you with the care you need, when you need it.  We recommend signing up for the patient portal called "MyChart".  Sign up information is provided on this After Visit Summary.  MyChart is used to connect with patients for Virtual Visits (Telemedicine).  Patients are able to view lab/test results, encounter notes, upcoming appointments, etc.  Non-urgent messages can be sent to your provider as well.   To learn more about what you can do with MyChart, go to [email or web address].    Your next appointment:   1 year(s)  Provider:   Gregg Taylor, MD    Other Instructions Thank you for choosing [facility]!    

## 2022-07-01 NOTE — Telephone Encounter (Signed)
  Patient Consent for Virtual Visit  [id number]}   David Stein has provided verbal consent on 07/01/2022 for a virtual visit (video or telephone).   CONSENT FOR VIRTUAL VISIT FOR:  David Stein  By participating in this virtual visit I agree to the following:  I hereby voluntarily request, consent and authorize [facility] and its employed or contracted physicians, physician assistants, nurse practitioners or other licensed health care professionals (the Practitioner), to provide me with telemedicine health care services (the "Services") as deemed necessary by the treating Practitioner. I acknowledge and consent to receive the Services by the Practitioner via telemedicine. I understand that the telemedicine visit will involve communicating with the Practitioner through live audiovisual communication technology and the disclosure of certain medical information by electronic transmission. I acknowledge that I have been given the opportunity to request an in-person assessment or other available alternative prior to the telemedicine visit and am voluntarily participating in the telemedicine visit.  I understand that I have the right to withhold or withdraw my consent to the use of telemedicine in the course of my care at any time, without affecting my right to future care or treatment, and that the Practitioner or I may terminate the telemedicine visit at any time. I understand that I have the right to inspect all information obtained and/or recorded in the course of the telemedicine visit and may receive copies of available information for a reasonable fee.  I understand that some of the potential risks of receiving the Services via telemedicine include:  Delay or interruption in medical evaluation due to technological equipment failure or disruption; Information transmitted may not be sufficient (e.g. poor resolution of images) to allow for appropriate medical decision making by the Practitioner;  and/or  In rare instances, security protocols could fail, causing a breach of personal health information.  Furthermore, I acknowledge that it is my responsibility to provide information about my medical history, conditions and care that is complete and accurate to the best of my ability. I acknowledge that Practitioner's advice, recommendations, and/or decision may be based on factors not within their control, such as incomplete or inaccurate data provided by me or distortions of diagnostic images or specimens that may result from electronic transmissions. I understand that the practice of medicine is not an exact science and that Practitioner makes no warranties or guarantees regarding treatment outcomes. I acknowledge that a copy of this consent can be made available to me via my patient portal [facility] MyChart), or I can request a printed copy by calling the office of [facility].    I understand that my insurance will be billed for this visit.   I have read or had this consent read to me. I understand the contents of this consent, which adequately explains the benefits and risks of the Services being provided via telemedicine.  I have been provided ample opportunity to ask questions regarding this consent and the Services and have had my questions answered to my satisfaction. I give my informed consent for the services to be provided through the use of telemedicine in my medical care

## 2022-07-02 DIAGNOSIS — D518 Other vitamin B12 deficiency anemias: Secondary | ICD-10-CM | POA: Diagnosis not present

## 2022-07-02 DIAGNOSIS — E559 Vitamin D deficiency, unspecified: Secondary | ICD-10-CM | POA: Diagnosis not present

## 2022-07-02 DIAGNOSIS — L8991 Pressure ulcer of unspecified site, stage 1: Secondary | ICD-10-CM | POA: Diagnosis not present

## 2022-07-02 DIAGNOSIS — Z1331 Encounter for screening for depression: Secondary | ICD-10-CM | POA: Diagnosis not present

## 2022-07-02 DIAGNOSIS — Z0001 Encounter for general adult medical examination with abnormal findings: Secondary | ICD-10-CM | POA: Diagnosis not present

## 2022-07-02 DIAGNOSIS — F0390 Unspecified dementia without behavioral disturbance: Secondary | ICD-10-CM | POA: Diagnosis not present

## 2022-07-02 DIAGNOSIS — J9610 Chronic respiratory failure, unspecified whether with hypoxia or hypercapnia: Secondary | ICD-10-CM | POA: Diagnosis not present

## 2022-07-02 DIAGNOSIS — E663 Overweight: Secondary | ICD-10-CM | POA: Diagnosis not present

## 2022-07-02 DIAGNOSIS — I10 Essential (primary) hypertension: Secondary | ICD-10-CM | POA: Diagnosis not present

## 2022-07-02 DIAGNOSIS — I4891 Unspecified atrial fibrillation: Secondary | ICD-10-CM | POA: Diagnosis not present

## 2022-07-02 DIAGNOSIS — I700 Atherosclerosis of aorta: Secondary | ICD-10-CM | POA: Diagnosis not present

## 2022-07-02 DIAGNOSIS — Z6826 Body mass index (BMI) 26.0-26.9, adult: Secondary | ICD-10-CM | POA: Diagnosis not present

## 2022-07-02 DIAGNOSIS — J449 Chronic obstructive pulmonary disease, unspecified: Secondary | ICD-10-CM | POA: Diagnosis not present

## 2022-07-02 DIAGNOSIS — G9332 Myalgic encephalomyelitis/chronic fatigue syndrome: Secondary | ICD-10-CM | POA: Diagnosis not present

## 2022-07-02 DIAGNOSIS — A310 Pulmonary mycobacterial infection: Secondary | ICD-10-CM | POA: Diagnosis not present

## 2022-07-07 DIAGNOSIS — G4733 Obstructive sleep apnea (adult) (pediatric): Secondary | ICD-10-CM | POA: Diagnosis not present

## 2022-07-16 DIAGNOSIS — K080 Exfoliation of teeth due to systemic causes: Secondary | ICD-10-CM | POA: Diagnosis not present

## 2022-07-22 DIAGNOSIS — I4891 Unspecified atrial fibrillation: Secondary | ICD-10-CM | POA: Diagnosis not present

## 2022-07-22 DIAGNOSIS — J449 Chronic obstructive pulmonary disease, unspecified: Secondary | ICD-10-CM | POA: Diagnosis not present

## 2022-07-22 DIAGNOSIS — J9610 Chronic respiratory failure, unspecified whether with hypoxia or hypercapnia: Secondary | ICD-10-CM | POA: Diagnosis not present

## 2022-08-07 ENCOUNTER — Telehealth: Payer: Self-pay | Admitting: Internal Medicine

## 2022-08-07 NOTE — Telephone Encounter (Signed)
Lyla Son calling in about medical Records for this patient, informed her that we haven't seen him in 2 yrs

## 2022-08-12 NOTE — Telephone Encounter (Signed)
Please advise if any fax on pt was sent to [place] office.

## 2022-08-12 NOTE — Telephone Encounter (Signed)
I called and spoke with the pt and received verbal permission to fax over this ov notes to [facility]. I have faxed them to the number provided. Nothing further needed.

## 2022-08-21 DIAGNOSIS — J449 Chronic obstructive pulmonary disease, unspecified: Secondary | ICD-10-CM | POA: Diagnosis not present

## 2022-08-21 DIAGNOSIS — J9610 Chronic respiratory failure, unspecified whether with hypoxia or hypercapnia: Secondary | ICD-10-CM | POA: Diagnosis not present

## 2022-08-22 DIAGNOSIS — I10 Essential (primary) hypertension: Secondary | ICD-10-CM | POA: Diagnosis not present

## 2022-08-22 DIAGNOSIS — E039 Hypothyroidism, unspecified: Secondary | ICD-10-CM | POA: Diagnosis not present

## 2022-08-22 DIAGNOSIS — I4891 Unspecified atrial fibrillation: Secondary | ICD-10-CM | POA: Diagnosis not present

## 2022-08-22 DIAGNOSIS — J449 Chronic obstructive pulmonary disease, unspecified: Secondary | ICD-10-CM | POA: Diagnosis not present

## 2022-09-01 DIAGNOSIS — K080 Exfoliation of teeth due to systemic causes: Secondary | ICD-10-CM | POA: Diagnosis not present

## 2022-09-21 DIAGNOSIS — J449 Chronic obstructive pulmonary disease, unspecified: Secondary | ICD-10-CM | POA: Diagnosis not present

## 2022-09-21 DIAGNOSIS — J9610 Chronic respiratory failure, unspecified whether with hypoxia or hypercapnia: Secondary | ICD-10-CM | POA: Diagnosis not present

## 2022-09-21 DIAGNOSIS — I4891 Unspecified atrial fibrillation: Secondary | ICD-10-CM | POA: Diagnosis not present

## 2022-09-29 ENCOUNTER — Ambulatory Visit: Payer: BC Managed Care – PPO | Admitting: Urology

## 2022-10-01 ENCOUNTER — Other Ambulatory Visit: Payer: Self-pay

## 2022-10-01 DIAGNOSIS — N200 Calculus of kidney: Secondary | ICD-10-CM

## 2022-10-09 ENCOUNTER — Ambulatory Visit (HOSPITAL_COMMUNITY)
Admission: RE | Admit: 2022-10-09 | Discharge: 2022-10-09 | Disposition: A | Discharge status: Home / Self Care | Payer: Medicare Other | Source: Ambulatory Visit | Attending: Urology | Admitting: Urology

## 2022-10-09 DIAGNOSIS — N281 Cyst of kidney, acquired: Secondary | ICD-10-CM | POA: Diagnosis not present

## 2022-10-09 DIAGNOSIS — N200 Calculus of kidney: Secondary | ICD-10-CM | POA: Diagnosis not present

## 2022-10-14 ENCOUNTER — Other Ambulatory Visit: Payer: Self-pay | Admitting: Cardiology

## 2022-10-15 DIAGNOSIS — F419 Anxiety disorder, unspecified: Secondary | ICD-10-CM | POA: Diagnosis not present

## 2022-10-15 DIAGNOSIS — D6869 Other thrombophilia: Secondary | ICD-10-CM | POA: Diagnosis not present

## 2022-10-21 DIAGNOSIS — J449 Chronic obstructive pulmonary disease, unspecified: Secondary | ICD-10-CM | POA: Diagnosis not present

## 2022-10-21 DIAGNOSIS — J9610 Chronic respiratory failure, unspecified whether with hypoxia or hypercapnia: Secondary | ICD-10-CM | POA: Diagnosis not present

## 2022-10-21 NOTE — Progress Notes (Signed)
Letter sent.

## 2022-10-29 ENCOUNTER — Telehealth: Payer: Self-pay | Admitting: Cardiology

## 2022-10-29 NOTE — Telephone Encounter (Signed)
Pt c/o medication issue:  1. Name of Medication: apixaban (ELIQUIS) 5 MG TABS tablet   2. How are you currently taking this medication (dosage and times per day)?   Take 1 tablet (5 mg total) by mouth 2 (two) times daily.    3. Are you having a reaction (difficulty breathing--STAT)? No  4. What is your medication issue? Office calling to ask if we could switch the Eliquis for Xarelto because the Eliquis is too expensive and pt assistance was denied. Please advise.

## 2022-10-29 NOTE — Telephone Encounter (Signed)
Spoke with pcp - informed him that Dr. Wyline Mood has not seen patient since 06/18/2021 & Dr. Ladona Ridgel since 06/04/2021.  Does not appear that we ever filled the Eliquis for him.  PCP states he will go ahead & change him to Xarelto & inform him that he is overdue with [place].

## 2022-11-21 DIAGNOSIS — J449 Chronic obstructive pulmonary disease, unspecified: Secondary | ICD-10-CM | POA: Diagnosis not present

## 2022-11-21 DIAGNOSIS — J9610 Chronic respiratory failure, unspecified whether with hypoxia or hypercapnia: Secondary | ICD-10-CM | POA: Diagnosis not present

## 2022-11-28 ENCOUNTER — Encounter: Payer: Self-pay | Admitting: Urology

## 2022-11-28 ENCOUNTER — Ambulatory Visit: Payer: Medicare Other | Admitting: Urology

## 2022-11-28 VITALS — BP 112/58 | HR 85

## 2022-11-28 DIAGNOSIS — R310 Gross hematuria: Secondary | ICD-10-CM

## 2022-11-28 DIAGNOSIS — N200 Calculus of kidney: Secondary | ICD-10-CM

## 2022-11-28 LAB — URINALYSIS, ROUTINE W REFLEX MICROSCOPIC
Bilirubin, UA: NEGATIVE
Glucose, UA: NEGATIVE
Nitrite, UA: NEGATIVE
Specific Gravity, UA: 1.015 (ref 1.005–1.030)
Urobilinogen, Ur: 2 mg/dL — ABNORMAL HIGH (ref 0.2–1.0)
pH, UA: 6.5 (ref 5.0–7.5)

## 2022-11-28 LAB — MICROSCOPIC EXAMINATION
Bacteria, UA: NONE SEEN
RBC, Urine: >30 /HPF — AB (ref 0–2)

## 2022-11-28 MED ORDER — TAMSULOSIN HCL 0.4 MG PO CAPS: 0.4000 mg | ORAL_CAPSULE | Freq: Two times a day (BID) | ORAL | 11 refills | Status: DC

## 2022-11-28 MED ORDER — FINASTERIDE 5 MG PO TABS: 5.0000 mg | ORAL_TABLET | Freq: Every day | ORAL | 3 refills | Status: DC

## 2022-11-28 NOTE — Patient Instructions (Signed)

## 2022-11-28 NOTE — Progress Notes (Signed)
11/28/2022 12:34 PM   Rosalia Hammers Elon Jester 11-Nov-1945 [id number]  Referring provider: Elfredia Nevins, MD [address] [place],  [place] [address]  No chief complaint on file.   HPI: Mr Diantonio is a 77yo here for followup for gross hematuria and nephrolithiasis. No stone events since last visit. He has intermittent left flank pain. Renal US 09/2022 showed 2 right renal calculi and 1 left renal calculus. He has intermittent pink urine.    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 Nile Riggs, MD;  Location: [facility];  Service: Ophthalmology;  Laterality: Right;   CATARACT EXTRACTION W/PHACO Left 02/14/2014   Procedure: CATARACT EXTRACTION PHACO AND INTRAOCULAR LENS PLACEMENT; CDE:  8.34;  Surgeon: Loraine Leriche T. Nile Riggs, MD;  Location: [facility];  Service: Ophthalmology;  Laterality: Left;   KNEE SURGERY Left     Home Medications:  Allergies as of 11/28/2022       Reactions   Codeine Other (See Comments)   headache        Medication List        Accurate as of November 28, 2022 12:34 PM. If you have any questions, ask your nurse or doctor.          acetaminophen 500 MG tablet Commonly known as: TYLENOL Take 500 mg by mouth every 6 (six) hours as needed for mild pain.   albuterol (2.5 MG/3ML) 0.083% nebulizer solution Commonly known as: PROVENTIL Take 3 mLs (2.5 mg total) by nebulization every 4 (four) hours as needed for wheezing or shortness of breath.   ProAir HFA 108 (90 Base) MCG/ACT inhaler Generic drug: albuterol Inhale 1 puff into the lungs every 6  (six) hours as needed for wheezing or shortness of breath.   ALPRAZolam 0.5 MG tablet Commonly known as: XANAX Take 0.5 mg by mouth every 8 (eight) hours as needed for anxiety.   amiodarone 200 MG tablet Commonly known as: PACERONE Take 1 tablet (200 mg total) by mouth daily.   apixaban 5 MG Tabs tablet Commonly known as: ELIQUIS Take 1 tablet (5 mg total) by mouth 2 (two) times daily.   budesonide-formoterol 160-4.5 MCG/ACT inhaler Commonly known as: SYMBICORT Inhale 2 puffs into the lungs 2 (two) times daily.   D3 2000 50 MCG (2000 UT) Caps Generic drug: Cholecalciferol Take 1 capsule by mouth daily.   diltiazem 240 MG 24 hr capsule Commonly known as: CARDIZEM CD Take 1 capsule (240 mg total) by mouth daily.   fluticasone 50 MCG/ACT nasal spray Commonly known as: FLONASE Place 1 spray into both nostrils daily.   furosemide 20 MG tablet Commonly known as: LASIX TAKE ONE TABLET BY MOUTH DAILY.MAY TAKE AN ADDITIONAL TABLET AS NEEDED.   guaiFENesin 600 MG 12 hr tablet Commonly known as: MUCINEX Take 1 tablet (600 mg total) by mouth 2 (two) times daily.   HYDROcodone-acetaminophen 10-325 MG tablet Commonly known as: NORCO Take 1 tablet by mouth 4 (four) times daily as needed.   levothyroxine 50 MCG tablet Commonly known as: SYNTHROID Take 50 mcg by mouth  daily before breakfast.   M-4 Knee High Stockings Misc 1 each by Does not apply route as directed.   mirtazapine 15 MG tablet Commonly known as: REMERON Take 15 mg by mouth at bedtime.   multivitamin with minerals Tabs tablet Take 1 tablet by mouth daily.   OXYGEN Inhale 3 L into the lungs daily.   predniSONE 10 MG tablet Commonly known as: DELTASONE Take 10 mg by mouth daily as needed (COPD).   rosuvastatin 10 MG tablet Commonly known as: CRESTOR Take 10 mg by mouth daily.   Spiriva Respimat 2.5 MCG/ACT Aers Generic drug: Tiotropium Bromide Monohydrate Inhale 2 puffs into the lungs daily.    sulfamethoxazole-trimethoprim 800-160 MG tablet Commonly known as: BACTRIM DS Take 1 tablet by mouth 2 (two) times daily.   tamsulosin 0.4 MG Caps capsule Commonly known as: FLOMAX Take 1 capsule (0.4 mg total) by mouth 2 (two) times daily.   traZODone 50 MG tablet Commonly known as: DESYREL Take 50 mg by mouth at bedtime.        Allergies:  Allergies  Allergen Reactions   Codeine Other (See Comments)    headache    Family History: Family History  Problem Relation Age of Onset   Rheumatic fever Father    Alzheimer's disease Mother    Stroke Sister     Social History:  reports that he quit smoking about 6 years ago. His smoking use included cigarettes. He started smoking about 62 years ago. He has a 13.8 pack-year smoking history. He has never used smokeless tobacco. He reports that he does not drink alcohol and does not use drugs.  ROS: All other review of systems were reviewed and are negative except what is noted above in HPI  Physical Exam: BP (!) 112/58   Pulse 85   Constitutional:  Alert and oriented, No acute distress. HEENT: [place] AT, moist mucus membranes.  Trachea midline, no masses. Cardiovascular: No clubbing, cyanosis, or edema. Respiratory: Normal respiratory effort, no increased work of breathing. GI: Abdomen is soft, nontender, nondistended, no abdominal masses GU: No CVA tenderness.  Lymph: No cervical or inguinal lymphadenopathy. Skin: No rashes, bruises or suspicious lesions. Neurologic: Grossly intact, no focal deficits, moving all 4 extremities. Psychiatric: Normal mood and affect.  Laboratory Data: Lab Results  Component Value Date   WBC 11.2 (H) 12/21/2021   HGB 11.9 (L) 12/21/2021   HCT 38.7 (L) 12/21/2021   MCV 103.2 (H) 12/21/2021   PLT 263 12/21/2021    Lab Results  Component Value Date   CREATININE 0.68 12/21/2021    No results found for: "PSA"  No results found for: "TESTOSTERONE"  No results found for:  "HGBA1C"  Urinalysis    Component Value Date/Time   COLORURINE YELLOW 12/21/2021 2148   APPEARANCEUR Cloudy (A) 04/02/2022 1459   LABSPEC 1.012 12/21/2021 2148   PHURINE 7.0 12/21/2021 2148   GLUCOSEU Negative 04/02/2022 1459   HGBUR SMALL (A) 12/21/2021 2148   BILIRUBINUR Negative 04/02/2022 1459   KETONESUR NEGATIVE 12/21/2021 2148   PROTEINUR 1+ (A) 04/02/2022 1459   PROTEINUR NEGATIVE 12/21/2021 2148   NITRITE Negative 04/02/2022 1459   NITRITE NEGATIVE 12/21/2021 2148   LEUKOCYTESUR Negative 04/02/2022 1459   LEUKOCYTESUR NEGATIVE 12/21/2021 2148    Lab Results  Component Value Date   LABMICR See below: 04/02/2022   WBCUA 0-5 04/02/2022   LABEPIT 0-10 04/02/2022   BACTERIA None seen 04/02/2022    Pertinent Imaging: Renal US 09/2022: Images reviewed and discussed with the  patient  No results found for this or any previous visit.  No results found for this or any previous visit.  No results found for this or any previous visit.  No results found for this or any previous visit.  Results for orders placed in visit on 10/01/22  US RENAL  Narrative CLINICAL DATA:  Nephrolithiasis.  EXAM: RENAL / URINARY TRACT ULTRASOUND COMPLETE  COMPARISON:  Renal ultrasound 02/12/2022.  CT 01/04/2022  FINDINGS: Right Kidney:  Renal measurements: 12 x 6 x 5.6 cm = volume: 209 mL. Normal parenchymal echogenicity. No hydronephrosis. 2 suspected nonobstructing intrarenal stones measuring approximately 5 mm. Simple cyst measures 3.4 x 3.2 x 3.4 cm in the lower pole. No imaging follow-up is needed. No suspicious renal lesion.  Left Kidney:  Renal measurements: 11.7 x 6.2 x 5.9 cm = volume: 224 mL. No hydronephrosis. Normal parenchymal echogenicity. Nonobstructing 7 mm stone in the upper pole. Additional stones on prior CT are not well seen by ultrasound. No focal renal abnormality.  Bladder:  Partially distended. Appears normal for degree of  bladder distention.  Other:  None.  IMPRESSION: 1. No hydronephrosis.  Bilateral intrarenal calculi. 2. Simple cyst in the right kidney needs no further imaging follow-up.   Electronically Signed By: Narda Rutherford M.D. On: 10/18/2022 14:30  No valid procedures specified. Results for orders placed in visit on 12/31/21  CT HEMATURIA WORKUP  Narrative CLINICAL DATA:  Gross hematuria.  Flank and abdominal pain.  EXAM: CT ABDOMEN AND PELVIS WITHOUT AND WITH CONTRAST  TECHNIQUE: Multidetector CT imaging of the abdomen and pelvis was performed following the standard protocol before and following the bolus administration of intravenous contrast.  RADIATION DOSE REDUCTION: This exam was performed according to the departmental dose-optimization program which includes automated exposure control, adjustment of the mA and/or kV according to patient size and/or use of iterative reconstruction technique.  CONTRAST:  OMNIPAQUE IOHEXOL 300 MG/ML  SOLN  COMPARISON:  None.  FINDINGS: Lower chest: Mild bronchial wall thickening noted in the lower lungs bilaterally with dependent atelectasis in both lower lobes. Small focus of collapse/consolidative opacity in the lingula is probably atelectasis or scarring.  Hepatobiliary: No suspicious focal abnormality in the liver on this study without intravenous contrast. There is no evidence for gallstones, gallbladder wall thickening, or pericholecystic fluid. No intrahepatic or extrahepatic biliary dilation.  Pancreas: No focal mass lesion. No dilatation of the main duct. No intraparenchymal cyst. No peripancreatic edema.  Spleen: No splenomegaly. No focal mass lesion.  Adrenals/Urinary Tract: No adrenal nodule or mass. 3 cm water density lesion interpolar right kidney is compatible with a cyst. No stones are seen in the right kidney. Mild right hydroureteronephrosis noted secondary to a 2 mm stone in the right UVJ (66/2). 2  mm nonobstructing stone noted lower pole left kidney with 7 mm nonobstructing stone identified upper pole left kidney. 2 additional punctate stones are seen in the upper interpolar left kidney. No left ureteral stones. No secondary changes in the left kidney or ureter. No evidence for stones free in the bladder lumen.  Stomach/Bowel: Stomach is unremarkable. No gastric wall thickening. No evidence of outlet obstruction. Duodenum is normally positioned as is the ligament of Treitz. No small bowel wall thickening. No small bowel dilatation. The terminal ileum is normal. The appendix is normal. No gross colonic mass. No colonic wall thickening.  Vascular/Lymphatic: There is moderate atherosclerotic calcification of the abdominal aorta without aneurysm. There is no gastrohepatic or hepatoduodenal ligament lymphadenopathy. No retroperitoneal  or mesenteric lymphadenopathy. No pelvic sidewall lymphadenopathy.  Reproductive: The prostate gland and seminal vesicles are unremarkable.  Other: No intraperitoneal free fluid.  Musculoskeletal: No worrisome lytic or sclerotic osseous abnormality.  IMPRESSION: 1. 2 mm right UVJ stone with mild fullness of the right intrarenal collecting system and ureter and subtle right periureteric edema. 2. Nonobstructing left renal stones. 3. Mild bronchial wall thickening in the lower lungs bilaterally with dependent atelectasis in both lower lobes. 4. Small focus of collapse/consolidative opacity in the lingula is probably atelectasis or scarring. 5.  Aortic Atherosclerosis (ICD10-I70.0).   Electronically Signed By: Kennith Center M.D. On: 01/04/2022 15:04  No results found for this or any previous visit.   Assessment & Plan:    1. Nephrolithiasis -followup 6 months with renal US - Urinalysis, Routine w reflex microscopic  2. Gross hematuria -finasteride 5mg     No follow-ups on file.  Wilkie Aye, MD  [facility]  [place]

## 2022-12-19 DIAGNOSIS — H5203 Hypermetropia, bilateral: Secondary | ICD-10-CM | POA: Diagnosis not present

## 2022-12-22 DIAGNOSIS — J9610 Chronic respiratory failure, unspecified whether with hypoxia or hypercapnia: Secondary | ICD-10-CM | POA: Diagnosis not present

## 2022-12-22 DIAGNOSIS — J449 Chronic obstructive pulmonary disease, unspecified: Secondary | ICD-10-CM | POA: Diagnosis not present

## 2023-01-13 ENCOUNTER — Other Ambulatory Visit: Payer: Self-pay

## 2023-01-13 MED ORDER — FUROSEMIDE 20 MG PO TABS: 20.0000 mg | ORAL_TABLET | Freq: Every day | ORAL | 0 refills | Status: DC

## 2023-01-13 NOTE — Telephone Encounter (Signed)
Needs f/u apt,overdue to see dDr.Branch, I have messaged front office to schedule

## 2023-01-21 ENCOUNTER — Encounter (HOSPITAL_COMMUNITY): Payer: Self-pay | Admitting: Emergency Medicine

## 2023-01-21 ENCOUNTER — Emergency Department (HOSPITAL_COMMUNITY): Payer: Medicare Other

## 2023-01-21 ENCOUNTER — Inpatient Hospital Stay (HOSPITAL_COMMUNITY)
Admission: EM | Admit: 2023-01-21 | Discharge: 2023-01-23 | DRG: 191 | Disposition: A | Discharge status: Home Health | Payer: Medicare Other | Attending: Family Medicine | Admitting: Family Medicine

## 2023-01-21 ENCOUNTER — Other Ambulatory Visit: Payer: Self-pay

## 2023-01-21 DIAGNOSIS — J9621 Acute and chronic respiratory failure with hypoxia: Secondary | ICD-10-CM | POA: Diagnosis present

## 2023-01-21 DIAGNOSIS — I4892 Unspecified atrial flutter: Secondary | ICD-10-CM | POA: Diagnosis not present

## 2023-01-21 DIAGNOSIS — J441 Chronic obstructive pulmonary disease with (acute) exacerbation: Principal | ICD-10-CM

## 2023-01-21 DIAGNOSIS — J9622 Acute and chronic respiratory failure with hypercapnia: Secondary | ICD-10-CM | POA: Diagnosis present

## 2023-01-21 DIAGNOSIS — G2581 Restless legs syndrome: Secondary | ICD-10-CM | POA: Diagnosis present

## 2023-01-21 DIAGNOSIS — L89321 Pressure ulcer of left buttock, stage 1: Secondary | ICD-10-CM | POA: Diagnosis not present

## 2023-01-21 DIAGNOSIS — Z66 Do not resuscitate: Secondary | ICD-10-CM | POA: Diagnosis present

## 2023-01-21 DIAGNOSIS — Z79899 Other long term (current) drug therapy: Secondary | ICD-10-CM | POA: Diagnosis not present

## 2023-01-21 DIAGNOSIS — Z7951 Long term (current) use of inhaled steroids: Secondary | ICD-10-CM | POA: Diagnosis not present

## 2023-01-21 DIAGNOSIS — Z823 Family history of stroke: Secondary | ICD-10-CM | POA: Diagnosis not present

## 2023-01-21 DIAGNOSIS — K219 Gastro-esophageal reflux disease without esophagitis: Secondary | ICD-10-CM | POA: Diagnosis present

## 2023-01-21 DIAGNOSIS — Z1152 Encounter for screening for COVID-19: Secondary | ICD-10-CM | POA: Diagnosis not present

## 2023-01-21 DIAGNOSIS — Z87891 Personal history of nicotine dependence: Secondary | ICD-10-CM | POA: Diagnosis not present

## 2023-01-21 DIAGNOSIS — Z7901 Long term (current) use of anticoagulants: Secondary | ICD-10-CM | POA: Diagnosis not present

## 2023-01-21 DIAGNOSIS — Z820 Family history of epilepsy and other diseases of the nervous system: Secondary | ICD-10-CM

## 2023-01-21 DIAGNOSIS — J449 Chronic obstructive pulmonary disease, unspecified: Secondary | ICD-10-CM | POA: Diagnosis not present

## 2023-01-21 DIAGNOSIS — R0602 Shortness of breath: Secondary | ICD-10-CM | POA: Diagnosis not present

## 2023-01-21 DIAGNOSIS — J9602 Acute respiratory failure with hypercapnia: Principal | ICD-10-CM

## 2023-01-21 DIAGNOSIS — J479 Bronchiectasis, uncomplicated: Secondary | ICD-10-CM | POA: Diagnosis present

## 2023-01-21 DIAGNOSIS — J9611 Chronic respiratory failure with hypoxia: Secondary | ICD-10-CM | POA: Diagnosis not present

## 2023-01-21 DIAGNOSIS — J9811 Atelectasis: Secondary | ICD-10-CM | POA: Diagnosis not present

## 2023-01-21 DIAGNOSIS — R911 Solitary pulmonary nodule: Secondary | ICD-10-CM | POA: Diagnosis present

## 2023-01-21 DIAGNOSIS — Z9981 Dependence on supplemental oxygen: Secondary | ICD-10-CM | POA: Diagnosis not present

## 2023-01-21 DIAGNOSIS — J9610 Chronic respiratory failure, unspecified whether with hypoxia or hypercapnia: Secondary | ICD-10-CM | POA: Diagnosis not present

## 2023-01-21 DIAGNOSIS — L89311 Pressure ulcer of right buttock, stage 1: Secondary | ICD-10-CM | POA: Diagnosis not present

## 2023-01-21 DIAGNOSIS — J9612 Chronic respiratory failure with hypercapnia: Secondary | ICD-10-CM | POA: Diagnosis present

## 2023-01-21 DIAGNOSIS — Z8261 Family history of arthritis: Secondary | ICD-10-CM

## 2023-01-21 DIAGNOSIS — R310 Gross hematuria: Secondary | ICD-10-CM

## 2023-01-21 DIAGNOSIS — Z885 Allergy status to narcotic agent status: Secondary | ICD-10-CM | POA: Diagnosis not present

## 2023-01-21 DIAGNOSIS — Z79890 Hormone replacement therapy (postmenopausal): Secondary | ICD-10-CM | POA: Diagnosis not present

## 2023-01-21 DIAGNOSIS — R918 Other nonspecific abnormal finding of lung field: Secondary | ICD-10-CM | POA: Diagnosis not present

## 2023-01-21 DIAGNOSIS — E039 Hypothyroidism, unspecified: Secondary | ICD-10-CM | POA: Diagnosis not present

## 2023-01-21 DIAGNOSIS — R319 Hematuria, unspecified: Secondary | ICD-10-CM | POA: Diagnosis present

## 2023-01-21 DIAGNOSIS — L8991 Pressure ulcer of unspecified site, stage 1: Secondary | ICD-10-CM | POA: Diagnosis present

## 2023-01-21 DIAGNOSIS — R059 Cough, unspecified: Secondary | ICD-10-CM | POA: Diagnosis not present

## 2023-01-21 LAB — BRAIN NATRIURETIC PEPTIDE: B Natriuretic Peptide: 79 pg/mL (ref 0.0–100.0)

## 2023-01-21 LAB — URINALYSIS, ROUTINE W REFLEX MICROSCOPIC
Bacteria, UA: NONE SEEN
Bilirubin Urine: NEGATIVE
Glucose, UA: NEGATIVE mg/dL
Ketones, ur: NEGATIVE mg/dL
Leukocytes,Ua: NEGATIVE
Nitrite: NEGATIVE
Protein, ur: 100 mg/dL — AB
RBC / HPF: >50 RBC/hpf (ref 0–5)
Specific Gravity, Urine: 1.024 (ref 1.005–1.030)
pH: 6 (ref 5.0–8.0)

## 2023-01-21 LAB — CBC
HCT: 38.8 % — ABNORMAL LOW (ref 39.0–52.0)
Hemoglobin: 11 g/dL — ABNORMAL LOW (ref 13.0–17.0)
MCH: 29.1 pg (ref 26.0–34.0)
MCHC: 28.4 g/dL — ABNORMAL LOW (ref 30.0–36.0)
MCV: 102.6 fL — ABNORMAL HIGH (ref 80.0–100.0)
Platelets: 220 10*3/uL (ref 150–400)
RBC: 3.78 MIL/uL — ABNORMAL LOW (ref 4.22–5.81)
RDW: 13.8 % (ref 11.5–15.5)
WBC: 11.2 10*3/uL — ABNORMAL HIGH (ref 4.0–10.5)
nRBC: 0 % (ref 0.0–0.2)

## 2023-01-21 LAB — TROPONIN I (HIGH SENSITIVITY)
Troponin I (High Sensitivity): 8 ng/L (ref <18)
Troponin I (High Sensitivity): 9 ng/L (ref <18)

## 2023-01-21 LAB — BASIC METABOLIC PANEL
Anion gap: 9 (ref 5–15)
BUN: 17 mg/dL (ref 8–23)
CO2: 43 mmol/L — ABNORMAL HIGH (ref 22–32)
Calcium: 8.9 mg/dL (ref 8.9–10.3)
Chloride: 89 mmol/L — ABNORMAL LOW (ref 98–111)
Creatinine, Ser: 0.74 mg/dL (ref 0.61–1.24)
GFR, Estimated: >60 mL/min (ref >60)
Glucose, Bld: 100 mg/dL — ABNORMAL HIGH (ref 70–99)
Potassium: 3.8 mmol/L (ref 3.5–5.1)
Sodium: 141 mmol/L (ref 135–145)

## 2023-01-21 LAB — BLOOD GAS, VENOUS
Acid-Base Excess: 22.9 mmol/L — ABNORMAL HIGH (ref 0.0–2.0)
Bicarbonate: 53.1 mmol/L — ABNORMAL HIGH (ref 20.0–28.0)
Drawn by: 66297
O2 Saturation: 38.2 %
Patient temperature: 37.2
pCO2, Ven: 95 mm[Hg] (ref 44–60)
pH, Ven: 7.36 (ref 7.25–7.43)
pO2, Ven: <31 mm[Hg] — CL (ref 32–45)

## 2023-01-21 LAB — LACTIC ACID, PLASMA: Lactic Acid, Venous: 0.8 mmol/L (ref 0.5–1.9)

## 2023-01-21 LAB — SARS CORONAVIRUS 2 BY RT PCR: SARS Coronavirus 2 by RT PCR: NEGATIVE

## 2023-01-21 MED ORDER — TAMSULOSIN HCL 0.4 MG PO CAPS
0.4000 mg | ORAL_CAPSULE | Freq: Every day | ORAL | Status: DC
  Administered 2023-01-22: 0.4 mg via ORAL
  Filled 2023-01-21: qty 1

## 2023-01-21 MED ORDER — IPRATROPIUM-ALBUTEROL 0.5-2.5 (3) MG/3ML IN SOLN: 3.0000 mL | Freq: Three times a day (TID) | RESPIRATORY_TRACT | Status: DC

## 2023-01-21 MED ORDER — BUDESON-GLYCOPYRROL-FORMOTEROL 160-9-4.8 MCG/ACT IN AERO: 2.0000 | INHALATION_SPRAY | Freq: Two times a day (BID) | RESPIRATORY_TRACT | Status: DC

## 2023-01-21 MED ORDER — PREDNISONE 20 MG PO TABS: 40.0000 mg | ORAL_TABLET | Freq: Every day | ORAL | Status: DC

## 2023-01-21 MED ORDER — GUAIFENESIN-DM 100-10 MG/5ML PO SYRP
15.0000 mL | ORAL_SOLUTION | Freq: Three times a day (TID) | ORAL | Status: AC
  Administered 2023-01-22 (×2): 15 mL via ORAL
  Filled 2023-01-21 (×3): qty 15

## 2023-01-21 MED ORDER — PREDNISONE 20 MG PO TABS
40.0000 mg | ORAL_TABLET | Freq: Every day | ORAL | Status: DC
  Administered 2023-01-23: 40 mg via ORAL
  Filled 2023-01-21: qty 2

## 2023-01-21 MED ORDER — UMECLIDINIUM BROMIDE 62.5 MCG/ACT IN AEPB
1.0000 | INHALATION_SPRAY | Freq: Every day | RESPIRATORY_TRACT | Status: DC
  Administered 2023-01-23: 1 via RESPIRATORY_TRACT
  Filled 2023-01-21: qty 7

## 2023-01-21 MED ORDER — POLYETHYLENE GLYCOL 3350 17 G PO PACK: 17.0000 g | PACK | Freq: Every day | ORAL | Status: DC | PRN

## 2023-01-21 MED ORDER — SENNA 8.6 MG PO TABS
1.0000 | ORAL_TABLET | Freq: Every day | ORAL | Status: DC
  Administered 2023-01-23: 8.6 mg via ORAL
  Filled 2023-01-21 (×2): qty 1

## 2023-01-21 MED ORDER — PREDNISONE 10 MG PO TABS: 10.0000 mg | ORAL_TABLET | Freq: Every day | ORAL | Status: DC

## 2023-01-21 MED ORDER — IPRATROPIUM-ALBUTEROL 0.5-2.5 (3) MG/3ML IN SOLN
3.0000 mL | Freq: Once | RESPIRATORY_TRACT | Status: AC
  Administered 2023-01-21: 3 mL via RESPIRATORY_TRACT
  Filled 2023-01-21: qty 3

## 2023-01-21 MED ORDER — LEVOTHYROXINE SODIUM 50 MCG PO TABS
50.0000 ug | ORAL_TABLET | Freq: Every day | ORAL | Status: DC
Start: 2023-01-22 — End: 2023-01-21

## 2023-01-21 MED ORDER — ROSUVASTATIN CALCIUM 10 MG PO TABS
10.0000 mg | ORAL_TABLET | Freq: Every day | ORAL | Status: DC
  Administered 2023-01-22 – 2023-01-23 (×2): 10 mg via ORAL
  Filled 2023-01-21 (×2): qty 1

## 2023-01-21 MED ORDER — MOMETASONE FURO-FORMOTEROL FUM 100-5 MCG/ACT IN AERO
2.0000 | INHALATION_SPRAY | Freq: Two times a day (BID) | RESPIRATORY_TRACT | Status: DC
  Administered 2023-01-22 – 2023-01-23 (×2): 2 via RESPIRATORY_TRACT
  Filled 2023-01-21: qty 8.8

## 2023-01-21 MED ORDER — DILTIAZEM HCL ER COATED BEADS 240 MG PO CP24
240.0000 mg | ORAL_CAPSULE | Freq: Every day | ORAL | Status: DC
  Administered 2023-01-22 – 2023-01-23 (×2): 240 mg via ORAL
  Filled 2023-01-21 (×2): qty 1

## 2023-01-21 MED ORDER — LEVOTHYROXINE SODIUM 50 MCG PO TABS
50.0000 ug | ORAL_TABLET | Freq: Every day | ORAL | Status: DC
  Administered 2023-01-22 – 2023-01-23 (×2): 50 ug via ORAL
  Filled 2023-01-21 (×2): qty 1

## 2023-01-21 MED ORDER — IPRATROPIUM-ALBUTEROL 0.5-2.5 (3) MG/3ML IN SOLN
3.0000 mL | Freq: Four times a day (QID) | RESPIRATORY_TRACT | Status: DC
  Administered 2023-01-21: 3 mL via RESPIRATORY_TRACT
  Filled 2023-01-21: qty 3

## 2023-01-21 MED ORDER — METHOCARBAMOL 500 MG PO TABS: 500.0000 mg | ORAL_TABLET | Freq: Three times a day (TID) | ORAL | Status: DC | PRN

## 2023-01-21 MED ORDER — FUROSEMIDE 20 MG PO TABS
20.0000 mg | ORAL_TABLET | Freq: Two times a day (BID) | ORAL | Status: DC
  Administered 2023-01-22 – 2023-01-23 (×3): 20 mg via ORAL
  Filled 2023-01-21 (×3): qty 1

## 2023-01-21 MED ORDER — ACETAMINOPHEN 325 MG PO TABS: 650.0000 mg | ORAL_TABLET | Freq: Four times a day (QID) | ORAL | Status: DC | PRN

## 2023-01-21 MED ORDER — AMIODARONE HCL 200 MG PO TABS
200.0000 mg | ORAL_TABLET | Freq: Every day | ORAL | Status: DC
  Administered 2023-01-22 – 2023-01-23 (×2): 200 mg via ORAL
  Filled 2023-01-21 (×2): qty 1

## 2023-01-21 MED ORDER — IPRATROPIUM-ALBUTEROL 0.5-2.5 (3) MG/3ML IN SOLN
3.0000 mL | Freq: Four times a day (QID) | RESPIRATORY_TRACT | Status: DC
  Administered 2023-01-22 – 2023-01-23 (×5): 3 mL via RESPIRATORY_TRACT
  Filled 2023-01-21 (×5): qty 3

## 2023-01-21 MED ORDER — MAGNESIUM SULFATE 2 GM/50ML IV SOLN
2.0000 g | Freq: Once | INTRAVENOUS | Status: AC
  Administered 2023-01-21: 2 g via INTRAVENOUS
  Filled 2023-01-21: qty 50

## 2023-01-21 MED ORDER — METHYLPREDNISOLONE SODIUM SUCC 125 MG IJ SOLR
60.0000 mg | Freq: Two times a day (BID) | INTRAMUSCULAR | Status: DC
Start: 2023-01-21 — End: 2023-01-21

## 2023-01-21 MED ORDER — ALBUTEROL SULFATE (2.5 MG/3ML) 0.083% IN NEBU: 2.5000 mg | INHALATION_SOLUTION | RESPIRATORY_TRACT | Status: DC | PRN

## 2023-01-21 MED ORDER — ALPRAZOLAM 0.5 MG PO TABS
0.5000 mg | ORAL_TABLET | Freq: Two times a day (BID) | ORAL | Status: DC | PRN
  Administered 2023-01-22 – 2023-01-23 (×2): 0.5 mg via ORAL
  Filled 2023-01-21 (×2): qty 1

## 2023-01-21 MED ORDER — METHYLPREDNISOLONE SODIUM SUCC 125 MG IJ SOLR
60.0000 mg | Freq: Two times a day (BID) | INTRAMUSCULAR | Status: AC
  Administered 2023-01-22 (×2): 60 mg via INTRAVENOUS
  Filled 2023-01-21 (×2): qty 2

## 2023-01-21 MED ORDER — FINASTERIDE 5 MG PO TABS
5.0000 mg | ORAL_TABLET | Freq: Every day | ORAL | Status: DC
  Administered 2023-01-22 – 2023-01-23 (×2): 5 mg via ORAL
  Filled 2023-01-21 (×2): qty 1

## 2023-01-21 MED ORDER — DOXYCYCLINE HYCLATE 100 MG IV SOLR
100.0000 mg | Freq: Two times a day (BID) | INTRAVENOUS | Status: DC
  Administered 2023-01-21 – 2023-01-23 (×4): 100 mg via INTRAVENOUS
  Filled 2023-01-21 (×8): qty 100

## 2023-01-21 MED ORDER — IOHEXOL 300 MG/ML  SOLN
75.0000 mL | Freq: Once | INTRAMUSCULAR | Status: AC | PRN
  Administered 2023-01-21: 75 mL via INTRAVENOUS

## 2023-01-21 MED ORDER — POLYETHYLENE GLYCOL 3350 17 G PO PACK
17.0000 g | PACK | Freq: Every day | ORAL | Status: DC
  Administered 2023-01-22 – 2023-01-23 (×2): 17 g via ORAL
  Filled 2023-01-21 (×2): qty 1

## 2023-01-21 MED ORDER — ACETAMINOPHEN 650 MG RE SUPP: 650.0000 mg | Freq: Four times a day (QID) | RECTAL | Status: DC | PRN

## 2023-01-21 MED ORDER — APIXABAN 5 MG PO TABS
5.0000 mg | ORAL_TABLET | Freq: Two times a day (BID) | ORAL | Status: DC
  Administered 2023-01-22 – 2023-01-23 (×3): 5 mg via ORAL
  Filled 2023-01-21 (×3): qty 1

## 2023-01-21 MED ORDER — METHYLPREDNISOLONE SODIUM SUCC 125 MG IJ SOLR
125.0000 mg | Freq: Once | INTRAMUSCULAR | Status: AC
  Administered 2023-01-21: 125 mg via INTRAVENOUS
  Filled 2023-01-21: qty 2

## 2023-01-21 NOTE — ED Notes (Signed)
Both sets of blood cultures obtained before any antibiotic administration  

## 2023-01-21 NOTE — Assessment & Plan Note (Signed)
Resume Synthroid ?

## 2023-01-21 NOTE — Assessment & Plan Note (Signed)
Chronic intermittent hematuria.  Hemoglobin stable at 11.  On Eliquis.  Follows with urologist Dr. Ronne Binning.

## 2023-01-21 NOTE — ED Notes (Addendum)
Pt is on 4 L via [place] at baseline. Pts electric wheelchair is at bedside with him

## 2023-01-21 NOTE — Assessment & Plan Note (Signed)
Rate controlled on anticoagulation with Eliquis. -Resume Eliquis, Cardizem, amiodarone

## 2023-01-21 NOTE — ED Triage Notes (Signed)
Pt presents with several days of increased SOB, cough, and weakness, chronically on 4 liters of O2, history of COPD and Emphysema.

## 2023-01-21 NOTE — ED Provider Notes (Signed)
[place] EMERGENCY DEPARTMENT AT [facility] Provider Note   CSN: [id number] Arrival date & time: 01/21/23  1507     History {Add pertinent medical, surgical, social history, OB history to HPI:1} Chief Complaint  Patient presents with   Shortness of Breath    David Stein is a 77 y.o. male.  He has a history of COPD, is on 4 L of nasal cannula oxygen chronically and takes 10 mg of prednisone chronically.  On chronic Lasix.  he said he started getting more short of breath 2 or 3 days ago, took some leftover doxycycline without improvement.  She has had a productive cough although does not know what color sputum it is.  He has felt hot and cold but has not checked his temperature.  Just feels generally weak all over.  He is also had some hematuria today, says he usually gets blood in his urine every 3 to 4 days, follows with Dr. Ronne Binning.  The history is provided by the patient and a relative.  Shortness of Breath Severity:  Moderate Onset quality:  Gradual Duration:  3 days Timing:  Constant Progression:  Worsening Chronicity:  Recurrent Relieved by:  Nothing Worsened by:  Activity and coughing Ineffective treatments:  Rest and oxygen Associated symptoms: cough and sputum production   Associated symptoms: no abdominal pain, no chest pain, no fever, no hemoptysis, no syncope and no vomiting        Home Medications Prior to Admission medications   Medication Sig Start Date End Date Taking? Authorizing Provider  acetaminophen (TYLENOL) 500 MG tablet Take 500 mg by mouth every 6 (six) hours as needed for mild pain.    [provider]  albuterol (PROVENTIL) (2.5 MG/3ML) 0.083% nebulizer solution Take 3 mLs (2.5 mg total) by nebulization every 4 (four) hours as needed for wheezing or shortness of breath. 09/20/21   Shon Hale, MD  ALPRAZolam Prudy Feeler) 0.5 MG tablet Take 0.5 mg by mouth every 8 (eight) hours as needed for anxiety. 06/11/20   [provider]  amiodarone (PACERONE) 200 MG tablet Take 1 tablet (200 mg total) by mouth daily. 04/07/22   Marinus Maw, MD  apixaban (ELIQUIS) 5 MG TABS tablet Take 1 tablet (5 mg total) by mouth 2 (two) times daily. 12/14/15   Langston Reusing, MD  budesonide-formoterol [facility]) 160-4.5 MCG/ACT inhaler Inhale 2 puffs into the lungs 2 (two) times daily. 09/20/21   Shon Hale, MD  Cholecalciferol (D3 2000) 50 MCG (2000 UT) CAPS Take 1 capsule by mouth daily.    [provider]  diltiazem (CARDIZEM CD) 240 MG 24 hr capsule Take 1 capsule (240 mg total) by mouth daily. 07/01/22   Marinus Maw, MD  Elastic Bandages & Supports (M-4 KNEE HIGH STOCKINGS) MISC 1 each by Does not apply route as directed. 08/02/20   Antoine Poche, MD  finasteride (PROSCAR) 5 MG tablet Take 1 tablet (5 mg total) by mouth daily. 11/28/22   McKenzie, Mardene Celeste, MD  fluticasone (FLONASE) 50 MCG/ACT nasal spray Place 1 spray into both nostrils daily. Patient not taking: Reported on 07/01/2022    [provider]  furosemide (LASIX) 20 MG tablet Take 1 tablet (20 mg total) by mouth daily. 01/13/23   Antoine Poche, MD  guaiFENesin (MUCINEX) 600 MG 12 hr tablet Take 1 tablet (600 mg total) by mouth 2 (two) times daily. 09/20/21   Shon Hale, MD  HYDROcodone-acetaminophen (NORCO) 10-325 MG tablet Take 1 tablet  by mouth 4 (four) times daily as needed. 12/10/21   [provider]  levothyroxine (SYNTHROID, LEVOTHROID) 50 MCG tablet Take 50 mcg by mouth daily before breakfast.    [provider]  mirtazapine (REMERON) 15 MG tablet Take 15 mg by mouth at bedtime. 06/25/21   [provider]  Multiple Vitamin (MULTIVITAMIN WITH MINERALS) TABS tablet Take 1 tablet by mouth daily.    [provider]  OXYGEN Inhale 3 L into the lungs daily.    [provider]  predniSONE (DELTASONE) 10 MG tablet Take 10 mg by mouth daily as needed (COPD). 08/30/21   [provider]  PROAIR HFA 108 (90 Base) MCG/ACT inhaler Inhale 1 puff into the lungs every 6 (six) hours as needed for wheezing or shortness of breath. 09/20/21   Shon Hale, MD  rosuvastatin (CRESTOR) 10 MG tablet Take 10 mg by mouth daily. 06/12/20   [provider]  sulfamethoxazole-trimethoprim (BACTRIM DS) 800-160 MG tablet Take 1 tablet by mouth 2 (two) times daily. 12/31/21   Summerlin, Regan Rakers, PA-C  tamsulosin (FLOMAX) 0.4 MG CAPS capsule Take 1 capsule (0.4 mg total) by mouth 2 (two) times daily. 11/28/22   McKenzie, Mardene Celeste, MD  Tiotropium Bromide Monohydrate (SPIRIVA RESPIMAT) 2.5 MCG/ACT AERS Inhale 2 puffs into the lungs daily. 09/20/21   Shon Hale, MD  traZODone (DESYREL) 50 MG tablet Take 50 mg by mouth at bedtime.    [provider]      Allergies    Codeine    Review of Systems   Review of Systems  Constitutional:  Negative for fever.  Respiratory:  Positive for cough, sputum production and shortness of breath. Negative for hemoptysis.   Cardiovascular:  Negative for chest pain and syncope.  Gastrointestinal:  Negative for abdominal pain and vomiting.    Physical Exam Updated Vital Signs BP 131/60 (BP Location: Right Arm)   Pulse 73   Temp 98.9 F (37.2 C) (Oral)   Resp 20   Ht 6\' 1"  (1.854 m)   Wt 90.7 kg   SpO2 91%   BMI 26.39 kg/m  Physical Exam Vitals and nursing note reviewed.  Constitutional:      General: He is not in acute distress.    Appearance: He is well-developed.  HENT:     Head: Normocephalic and atraumatic.  Eyes:     Conjunctiva/sclera: Conjunctivae normal.  Cardiovascular:     Rate and Rhythm: Normal rate and regular rhythm.     Heart sounds: No murmur heard. Pulmonary:     Effort: Tachypnea and accessory muscle usage present. No respiratory distress.     Breath sounds: Decreased breath sounds present.  Abdominal:     Palpations: Abdomen is soft.     Tenderness: There is no abdominal tenderness.   Musculoskeletal:        General: No swelling.     Cervical back: Neck supple.     Right lower leg: No tenderness. Edema present.     Left lower leg: No tenderness. Edema present.  Skin:    General: Skin is warm and dry.     Capillary Refill: Capillary refill takes less than 2 seconds.  Neurological:     General: No focal deficit present.     Mental Status: He is alert.     ED Results / Procedures / Treatments   Labs (all labs ordered are listed, but only abnormal results are displayed) Labs Reviewed  BASIC METABOLIC PANEL - Abnormal; Notable for  the following components:      Result Value   Chloride 89 (*)    CO2 43 (*)    Glucose, Bld 100 (*)    All other components within normal limits  CBC - Abnormal; Notable for the following components:   WBC 11.2 (*)    RBC 3.78 (*)    Hemoglobin 11.0 (*)    HCT 38.8 (*)    MCV 102.6 (*)    MCHC 28.4 (*)    All other components within normal limits  SARS CORONAVIRUS 2 BY RT PCR  URINALYSIS, ROUTINE W REFLEX MICROSCOPIC  TROPONIN I (HIGH SENSITIVITY)  TROPONIN I (HIGH SENSITIVITY)    EKG EKG Interpretation Date/Time:  Wednesday January 21 2023 15:29:41 EDT Ventricular Rate:  75 PR Interval:  196 QRS Duration:  154 QT Interval:  430 QTC Calculation: 480 R Axis:   93  Text Interpretation: Normal sinus rhythm Right bundle branch block Abnormal ECG When compared with ECG of 21-Dec-2021 23:49, No significant change since last tracing Confirmed by Meridee Score [phone]) on 01/21/2023 3:36:45 PM  Radiology DG Chest 2 View  Result Date: 01/21/2023 CLINICAL DATA:  Increased shortness of breath.  COPD.  Cough. EXAM: CHEST - 2 VIEW COMPARISON:  12/21/2021. FINDINGS: There is a faint approximately 4.5 x 6.0 cm opacity overlying the right lower lung zone, which may represent summation artifact from overlying soft tissues versus lung mass/consolidation, less likely. No corresponding opacity is seen on the lateral film. There is also  smaller but similar characteristic opacity overlying the left lower lung zone, favoring probability of artifact from overlying soft tissues. Bilateral lung fields are otherwise clear. Bilateral lungs appear hyperexpanded and hyperlucent with coarse bronchovascular markings, in keeping with COPD. There also multiple scattered sub 5 mm calcified granulomas throughout bilateral lungs. There are additional new subcentimeter nodular opacities overlying the right mid lung zone. Bilateral costophrenic angles are clear. Normal cardio-mediastinal silhouette. No acute osseous abnormalities. The soft tissues are within normal limits. IMPRESSION: *COPD. *There are faint opacities overlying the bilateral lower lung zones, which are favored to represent artifact versus lung mass/consolidation, as discussed above. There also additional new subcentimeter nodular opacities overlying the right mid lung zone. Further evaluation with chest CT scan is recommended. Electronically Signed   By: Jules Schick M.D.   On: 01/21/2023 16:58    Procedures Procedures  {Document cardiac monitor, telemetry assessment procedure when appropriate:1}  Medications Ordered in ED Medications  methylPREDNISolone sodium succinate (SOLU-MEDROL) 125 mg/2 mL injection 125 mg (has no administration in time range)  magnesium sulfate IVPB 2 g 50 mL (has no administration in time range)  ipratropium-albuterol (DUONEB) 0.5-2.5 (3) MG/3ML nebulizer solution 3 mL (has no administration in time range)    ED Course/ Medical Decision Making/ A&P   {   Click here for ABCD2, HEART and other calculatorsREFRESH Note before signing :1}                              Medical Decision Making Amount and/or Complexity of Data Reviewed Labs: ordered. Radiology: ordered.  Risk Prescription drug management.   This patient complains of ***; this involves an extensive number of treatment Options and is a complaint that carries with it a high risk of  complications and morbidity. The differential includes ***  I ordered, reviewed and interpreted labs, which included *** I ordered medication *** and reviewed PMP when indicated. I ordered imaging studies which included ***  and I independently    visualized and interpreted imaging which showed *** Additional history obtained from *** Previous records obtained and reviewed *** I consulted *** and discussed lab and imaging findings and discussed disposition.  Cardiac monitoring reviewed, *** Social determinants considered, *** Critical Interventions: ***  After the interventions stated above, I reevaluated the patient and found *** Admission and further testing considered, ***   {Document critical care time when appropriate:1} {Document review of labs and clinical decision tools ie heart score, Chads2Vasc2 etc:1}  {Document your independent review of radiology images, and any outside records:1} {Document your discussion with family members, caretakers, and with consultants:1} {Document social determinants of health affecting pt's care:1} {Document your decision making why or why not admission, treatments were needed:1} Final Clinical Impression(s) / ED Diagnoses Final diagnoses:  None    Rx / DC Orders ED Discharge Orders     None

## 2023-01-21 NOTE — Assessment & Plan Note (Addendum)
Presenting with dyspnea, difficulty breathing.  No increased O2 requirements.  On exam reduced air entry bilaterally.  On prednisone 10 mg daily.  COVID negative.  - CT chest with contrast-  increased peripheral consolidation within the dependent left lower lobe, favor progressive atelectasis/scarring over airspace disease or neoplasm. This could be re-evaluated at the time of additional imaging performed to evaluate the right pulmonary nodule. -VBG shows pCO2 elevated at 95 (no recent baseline), pH normal at 7.3, serum bicarb elevated at 43.  Most likely chronic, with compensation.  His mental status is intact. -IV doxycycline -125 mg Solu-Medrol given, continue 60 twice daily -DuoNebs, albuterol nebs -Hold home 10 mg prednisone

## 2023-01-21 NOTE — H&P (Signed)
History and Physical    David Stein ZOX:[id number] DOB: 1945-05-15 DOA: 01/21/2023  PCP: Elfredia Nevins, MD   Patient coming from: Home  I have personally briefly reviewed patient's old medical records in [facility]  Chief Complaint: Difficulty breathing  HPI: David Stein is a 77 y.o. 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 Presented to the ED with complaints of difficulty breathing, and productive cough with generalized weakness.  Patient reports over the past 2 days, he has had increasing difficulty breathing, with productive cough.  No chest pain.  He has chronic lower extremity swelling that is unchanged and actually improved today from baseline.  He is compliant with his medications Eliquis and Lasix daily.  No change in mental status.  No confusion. He reports about once every week he has blood in his urine, for which he follows with urologist, Dr. Ronne Binning.   ED Course: O2 sats 91 to 100% on 3 and half liters, temperature 98.9.  Heart rate 74-82.  Respiratory 12-26.  Blood pressure systolic 117 - 145. VBG shows pH of 7.3, pCO2 elevated at 95.  Serum bicarb chronically elevated at 43. Chest x-[place] - COPD, faint opacities overlying the bilateral lower lung zones- artifact versus lung mass/consolidation. Subsequent CT chest with contrast-  Increased peripheral consolidation within the dependent left lower lobe, favor progressive atelectasis/scarring over airspace disease or neoplasm. This could be re-evaluated at the time of additional imaging performed to evaluate the right pulmonary nodule. 125 mg Solu-Medrol given, DuoNebs and albuterol nebs given.  Hospitalist admit for COPD exacerbation.  Review of Systems: As per HPI all other systems reviewed and negative.  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 Nile Riggs, MD;  Location: [facility];  Service: Ophthalmology;  Laterality: Right;   CATARACT EXTRACTION W/PHACO Left 02/14/2014   Procedure: CATARACT EXTRACTION PHACO AND INTRAOCULAR LENS PLACEMENT; CDE:  8.34;  Surgeon: Loraine Leriche T. Nile Riggs, MD;  Location: [facility];  Service: Ophthalmology;  Laterality: Left;   KNEE SURGERY Left      reports that he quit smoking about 7 years ago. His smoking use included cigarettes. He started smoking about 62 years ago. He has a 13.8 pack-year smoking history. He has never used smokeless tobacco. He reports that he does not drink alcohol and does not use drugs.  Allergies  Allergen Reactions   Codeine Other (See Comments)    headache    Family History  Problem Relation Age of Onset   Rheumatic fever Father    Alzheimer's disease Mother    Stroke Sister     Prior to Admission medications   Medication Sig Start Date End Date Taking? Authorizing Provider  acetaminophen (TYLENOL) 500 MG tablet Take 500 mg by mouth every 6 (six) hours as needed for mild pain.   Yes [provider]  ALPRAZolam (XANAX) 0.5 MG tablet Take 0.5 mg by mouth every 8 (eight) hours as needed for anxiety. 06/11/20  Yes [provider]  amiodarone (PACERONE)  200 MG tablet Take 1 tablet (200 mg total) by mouth daily. 04/07/22  Yes Marinus Maw, MD  antiseptic oral rinse (BIOTENE) LIQD 15 mLs by Mouth Rinse route as needed for dry mouth.   Yes [provider]  apixaban (ELIQUIS) 5 MG TABS tablet Take 1 tablet (5 mg total) by mouth 2 (two) times daily. 12/14/15  Yes Langston Reusing, MD  Budeson-Glycopyrrol-Formoterol (BREZTRI AEROSPHERE) 160-9-4.8 MCG/ACT  AERO Inhale 2 puffs into the lungs in the morning and at bedtime.   Yes [provider]  carboxymethylcellulose 1 % ophthalmic solution Place 1 drop into both eyes daily as needed (dry eyes).   Yes [provider]  diltiazem (CARDIZEM CD) 240 MG 24 hr capsule Take 1 capsule (240 mg total) by mouth daily. 07/01/22  Yes Marinus Maw, MD  Elastic Bandages & Supports (M-4 KNEE HIGH STOCKINGS) MISC 1 each by Does not apply route as directed. 08/02/20  Yes BranchDorothe Pea, MD  finasteride (PROSCAR) 5 MG tablet Take 1 tablet (5 mg total) by mouth daily. 11/28/22  Yes McKenzie, Mardene Celeste, MD  fluticasone (FLONASE) 50 MCG/ACT nasal spray Place 1 spray into both nostrils daily.   Yes [provider]  furosemide (LASIX) 20 MG tablet Take 1 tablet (20 mg total) by mouth daily. Patient taking differently: Take 20 mg by mouth 2 (two) times daily. 01/13/23  Yes Branch, Dorothe Pea, MD  guaiFENesin (MUCINEX) 600 MG 12 hr tablet Take 1 tablet (600 mg total) by mouth 2 (two) times daily. 09/20/21  Yes Shon Hale, MD  HYDROcodone-acetaminophen (NORCO) 10-325 MG tablet Take 1 tablet by mouth 4 (four) times daily as needed for moderate pain (pain score 4-6). 12/10/21  Yes [provider]  levothyroxine (SYNTHROID, LEVOTHROID) 50 MCG tablet Take 50 mcg by mouth daily before breakfast.   Yes [provider]  MAGNESIUM CHLORIDE PO Take 1 tablet by mouth daily.   Yes [provider]  Multiple Vitamin (MULTIVITAMIN WITH MINERALS) TABS tablet Take 1 tablet by mouth daily.   Yes [provider]  OXYGEN Inhale 4-4.5 L into the lungs continuous.   Yes [provider]  polyethylene glycol (MIRALAX / GLYCOLAX) 17 g packet Take 17 g by mouth daily.   Yes [provider]  predniSONE (DELTASONE) 10 MG tablet Take 10 mg by mouth daily with breakfast. 08/30/21  Yes [provider]  PROAIR HFA 108 (90 Base) MCG/ACT inhaler Inhale 1 puff into the  lungs every 6 (six) hours as needed for wheezing or shortness of breath. 09/20/21  Yes Jaquelyn Sakamoto, Courage, MD  rosuvastatin (CRESTOR) 10 MG tablet Take 10 mg by mouth daily. 06/12/20  Yes [provider]  tamsulosin (FLOMAX) 0.4 MG CAPS capsule Take 1 capsule (0.4 mg total) by mouth 2 (two) times daily. Patient taking differently: Take 0.4 mg by mouth daily after supper. 11/28/22  Yes McKenzie, Mardene Celeste, MD  senna (SENOKOT) 8.6 MG tablet Take 1 tablet by mouth daily.    [provider]    Physical Exam: Vitals:   01/21/23 1715 01/21/23 1730 01/21/23 1800 01/21/23 1944  BP: 130/70 (!) 145/65 125/60 117/78  Pulse: 80 74 90 82  Resp: 12 16 (!) 26 13  Temp:      TempSrc:      SpO2: 96% 100% 100% 96%  Weight:      Height:        Constitutional: NAD, calm, comfortable Vitals:   01/21/23 1715 01/21/23 1730 01/21/23 1800  01/21/23 1944  BP: 130/70 (!) 145/65 125/60 117/78  Pulse: 80 74 90 82  Resp: 12 16 (!) 26 13  Temp:      TempSrc:      SpO2: 96% 100% 100% 96%  Weight:      Height:       Eyes: PERRL, lids and conjunctivae normal ENMT: Mucous membranes are moist.  Neck: normal, supple, no masses, no thyromegaly Respiratory: Reduced air entry bilaterally, otherwise clear to auscultation bilaterally, no wheezing, no crackles. Normal respiratory effort. No accessory muscle use.  Cardiovascular: Regular rate and rhythm, no murmurs / rubs / gallops.  Trace bilateral pitting lower extremity edema to just above ankle.  Extremities warm. Abdomen: no tenderness, no masses palpated. No hepatosplenomegaly. Bowel sounds positive.  Musculoskeletal: no clubbing / cyanosis. No joint deformity upper and lower extremities.  Skin: Stage I decubitus ulcer, measuring about 5 cm across, mild erythema without warmth, minimal tenderness to right medial buttock , no signs of infection, no open wounds lesions, no ulcers.  Neurologic: No facial asymmetry, moving extremities spontaneously.  Speech  fluent. Psychiatric: Normal judgment and insight. Alert and oriented x 3. Normal mood.   Labs on Admission: I have personally reviewed following labs and imaging studies  CBC: Recent Labs  Lab 01/21/23 1554  WBC 11.2*  HGB 11.0*  HCT 38.8*  MCV 102.6*  PLT 220   Basic Metabolic Panel: Recent Labs  Lab 01/21/23 1554  NA 141  K 3.8  CL 89*  CO2 43*  GLUCOSE 100*  BUN 17  CREATININE 0.74  CALCIUM 8.9   Urine analysis:    Component Value Date/Time   COLORURINE AMBER (A) 01/21/2023 1534   APPEARANCEUR CLOUDY (A) 01/21/2023 1534   APPEARANCEUR Cloudy (A) 11/28/2022 1233   LABSPEC 1.024 01/21/2023 1534   PHURINE 6.0 01/21/2023 1534   GLUCOSEU NEGATIVE 01/21/2023 1534   HGBUR LARGE (A) 01/21/2023 1534   BILIRUBINUR NEGATIVE 01/21/2023 1534   BILIRUBINUR Negative 11/28/2022 1233   KETONESUR NEGATIVE 01/21/2023 1534   PROTEINUR 100 (A) 01/21/2023 1534   NITRITE NEGATIVE 01/21/2023 1534   LEUKOCYTESUR NEGATIVE 01/21/2023 1534    Radiological Exams on Admission: CT Chest W Contrast  Result Date: 01/21/2023 CLINICAL DATA:  Increasing shortness of breath, cough, weakness EXAM: CT CHEST WITH CONTRAST TECHNIQUE: Multidetector CT imaging of the chest was performed during intravenous contrast administration. RADIATION DOSE REDUCTION: This exam was performed according to the departmental dose-optimization program which includes automated exposure control, adjustment of the mA and/or kV according to patient size and/or use of iterative reconstruction technique. CONTRAST:  75mL OMNIPAQUE IOHEXOL 300 MG/ML  SOLN COMPARISON:  09/26/2020, 01/21/2023 FINDINGS: Cardiovascular: The heart is unremarkable without pericardial effusion. No evidence of thoracic aortic aneurysm or dissection. Atherosclerosis of the aorta and coronary vasculature. Mediastinum/Nodes: No enlarged mediastinal, hilar, or axillary lymph nodes. Thyroid gland, trachea, and esophagus demonstrate no significant findings.  Lungs/Pleura: Subpleural spiculated nodule within the right upper lobe reference image 72/3, measures 12 x 9 mm. This corresponds to the nodular right perihilar density on preceding chest x-[place]. Peripheral subpleural consolidation within the left lower lobe could reflect airspace disease atelectasis, or scarring. This is new since prior chest CT. No other acute airspace disease. 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 Numerous punctate calcified granulomas are seen throughout the bilateral lungs. No effusion or pneumothorax. Upper Abdomen: Nonobstructing left renal calculi are  identified measuring up to 10 mm. No other acute upper abdominal findings. Musculoskeletal: No acute or destructive bony abnormalities. Reconstructed images demonstrate no additional findings. IMPRESSION: 1. Suspicious right solid pulmonary nodule within the upper lobe measuring 12 mm. Per Fleischner Society Guidelines, consider a non-contrast Chest CT at 3 months, a PET/CT, or tissue sampling. These guidelines do not apply to immunocompromised patients and patients with cancer. Follow up in patients with significant comorbidities as clinically warranted. For lung cancer screening, adhere to Lung-RADS guidelines. Reference: Radiology. 2017; 284(1):228-43. 2. Increased peripheral consolidation within the dependent left lower lobe, favor progressive atelectasis/scarring over airspace disease or neoplasm. This could be re-evaluated at the time of additional imaging performed to evaluate the right pulmonary nodule. 3. Progressive bibasilar bronchiectasis, scarring, and fibrosis. 4. Aortic Atherosclerosis (ICD10-I70.0) and Emphysema (ICD10-J43.9). 5. Nonobstructing left renal calculi. Electronically Signed   By: Sharlet Salina M.D.   On: 01/21/2023 20:46   DG Chest 2 View  Result Date:  01/21/2023 CLINICAL DATA:  Increased shortness of breath.  COPD.  Cough. EXAM: CHEST - 2 VIEW COMPARISON:  12/21/2021. FINDINGS: There is a faint approximately 4.5 x 6.0 cm opacity overlying the right lower lung zone, which may represent summation artifact from overlying soft tissues versus lung mass/consolidation, less likely. No corresponding opacity is seen on the lateral film. There is also smaller but similar characteristic opacity overlying the left lower lung zone, favoring probability of artifact from overlying soft tissues. Bilateral lung fields are otherwise clear. Bilateral lungs appear hyperexpanded and hyperlucent with coarse bronchovascular markings, in keeping with COPD. There also multiple scattered sub 5 mm calcified granulomas throughout bilateral lungs. There are additional new subcentimeter nodular opacities overlying the right mid lung zone. Bilateral costophrenic angles are clear. Normal cardio-mediastinal silhouette. No acute osseous abnormalities. The soft tissues are within normal limits. IMPRESSION: *COPD. *There are faint opacities overlying the bilateral lower lung zones, which are favored to represent artifact versus lung mass/consolidation, as discussed above. There also additional new subcentimeter nodular opacities overlying the right mid lung zone. Further evaluation with chest CT scan is recommended. Electronically Signed   By: Jules Schick M.D.   On: 01/21/2023 16:58    EKG: Independently reviewed.  Sinus rhythm, rate 75, QTc 460.  Old RBBB.  Assessment/Plan Principal Problem:   COPD exacerbation (HCC) Active Problems:   Stage 1 decubitus ulcer   Chronic anticoagulation   Atrial flutter (HCC)   Hypothyroidism   Chronic respiratory failure with hypoxia and hypercapnia (HCC)   Hematuria   Assessment and Plan: * COPD exacerbation (HCC) Presenting with dyspnea, difficulty breathing.  No increased O2 requirements.  On exam reduced air entry bilaterally.  On  prednisone 10 mg daily.  COVID negative.  - CT chest with contrast-  increased peripheral consolidation within the dependent left lower lobe, favor progressive atelectasis/scarring over airspace disease or neoplasm. This could be re-evaluated at the time of additional imaging performed to evaluate the right pulmonary nodule. -VBG shows pCO2 elevated at 95 (no recent baseline), pH normal at 7.3, serum bicarb elevated at 43.  Most likely chronic, with compensation.  His mental status is intact. -IV doxycycline -125 mg Solu-Medrol given, continue 60 twice daily -DuoNebs, albuterol nebs -Hold home 10 mg prednisone   Hematuria Chronic intermittent hematuria.  Hemoglobin stable at 11.  On Eliquis.  Follows with urologist Dr. Ronne Binning.  Chronic respiratory failure with hypoxia and hypercapnia (HCC) Chronic respiratory failure on 4 L.  No increased O2 requirement.  Sats currently 31  to 100% on 3 1/2 L.  Hypothyroidism Resume Synthroid.  Atrial flutter (HCC) Rate controlled on anticoagulation with Eliquis. -Resume Eliquis, Cardizem, amiodarone   DVT prophylaxis: Eliquis Code Status: DNR-confirmed with patient and daughter Kennyth Arnold at bedside. Family Communication:  Daughter Misty Stanley at bedside is [employer].  Disposition Plan:~ 2 days Consults called: None Admission status: Obs tele   Author: Onnie Boer, MD 01/21/2023 11:06 PM  For on call review [email or web address].

## 2023-01-21 NOTE — Assessment & Plan Note (Signed)
Chronic respiratory failure on 4 L.  No increased O2 requirement.  Sats currently 91 to 100% on 3 1/2 L.

## 2023-01-22 DIAGNOSIS — J449 Chronic obstructive pulmonary disease, unspecified: Secondary | ICD-10-CM | POA: Diagnosis not present

## 2023-01-22 DIAGNOSIS — L89311 Pressure ulcer of right buttock, stage 1: Secondary | ICD-10-CM | POA: Diagnosis present

## 2023-01-22 DIAGNOSIS — J9611 Chronic respiratory failure with hypoxia: Secondary | ICD-10-CM | POA: Diagnosis present

## 2023-01-22 DIAGNOSIS — K219 Gastro-esophageal reflux disease without esophagitis: Secondary | ICD-10-CM | POA: Diagnosis present

## 2023-01-22 DIAGNOSIS — J9612 Chronic respiratory failure with hypercapnia: Secondary | ICD-10-CM | POA: Diagnosis present

## 2023-01-22 DIAGNOSIS — G2581 Restless legs syndrome: Secondary | ICD-10-CM | POA: Diagnosis present

## 2023-01-22 DIAGNOSIS — Z1152 Encounter for screening for COVID-19: Secondary | ICD-10-CM | POA: Diagnosis not present

## 2023-01-22 DIAGNOSIS — J441 Chronic obstructive pulmonary disease with (acute) exacerbation: Secondary | ICD-10-CM | POA: Diagnosis present

## 2023-01-22 DIAGNOSIS — Z823 Family history of stroke: Secondary | ICD-10-CM | POA: Diagnosis not present

## 2023-01-22 DIAGNOSIS — Z7951 Long term (current) use of inhaled steroids: Secondary | ICD-10-CM | POA: Diagnosis not present

## 2023-01-22 DIAGNOSIS — Z87891 Personal history of nicotine dependence: Secondary | ICD-10-CM | POA: Diagnosis not present

## 2023-01-22 DIAGNOSIS — Z885 Allergy status to narcotic agent status: Secondary | ICD-10-CM | POA: Diagnosis not present

## 2023-01-22 DIAGNOSIS — Z79890 Hormone replacement therapy (postmenopausal): Secondary | ICD-10-CM | POA: Diagnosis not present

## 2023-01-22 DIAGNOSIS — R911 Solitary pulmonary nodule: Secondary | ICD-10-CM | POA: Diagnosis present

## 2023-01-22 DIAGNOSIS — R310 Gross hematuria: Secondary | ICD-10-CM | POA: Diagnosis present

## 2023-01-22 DIAGNOSIS — Z66 Do not resuscitate: Secondary | ICD-10-CM | POA: Diagnosis present

## 2023-01-22 DIAGNOSIS — Z79899 Other long term (current) drug therapy: Secondary | ICD-10-CM | POA: Diagnosis not present

## 2023-01-22 DIAGNOSIS — Z9981 Dependence on supplemental oxygen: Secondary | ICD-10-CM | POA: Diagnosis not present

## 2023-01-22 DIAGNOSIS — Z7901 Long term (current) use of anticoagulants: Secondary | ICD-10-CM | POA: Diagnosis not present

## 2023-01-22 DIAGNOSIS — Z820 Family history of epilepsy and other diseases of the nervous system: Secondary | ICD-10-CM | POA: Diagnosis not present

## 2023-01-22 DIAGNOSIS — Z8261 Family history of arthritis: Secondary | ICD-10-CM | POA: Diagnosis not present

## 2023-01-22 DIAGNOSIS — J479 Bronchiectasis, uncomplicated: Secondary | ICD-10-CM | POA: Diagnosis present

## 2023-01-22 DIAGNOSIS — E039 Hypothyroidism, unspecified: Secondary | ICD-10-CM | POA: Diagnosis present

## 2023-01-22 DIAGNOSIS — J9811 Atelectasis: Secondary | ICD-10-CM | POA: Diagnosis present

## 2023-01-22 DIAGNOSIS — J9610 Chronic respiratory failure, unspecified whether with hypoxia or hypercapnia: Secondary | ICD-10-CM | POA: Diagnosis not present

## 2023-01-22 DIAGNOSIS — I4892 Unspecified atrial flutter: Secondary | ICD-10-CM | POA: Diagnosis present

## 2023-01-22 LAB — CBC
HCT: 38.4 % — ABNORMAL LOW (ref 39.0–52.0)
Hemoglobin: 11.1 g/dL — ABNORMAL LOW (ref 13.0–17.0)
MCH: 30 pg (ref 26.0–34.0)
MCHC: 28.9 g/dL — ABNORMAL LOW (ref 30.0–36.0)
MCV: 103.8 fL — ABNORMAL HIGH (ref 80.0–100.0)
Platelets: 184 10*3/uL (ref 150–400)
RBC: 3.7 MIL/uL — ABNORMAL LOW (ref 4.22–5.81)
RDW: 13.8 % (ref 11.5–15.5)
WBC: 9.1 10*3/uL (ref 4.0–10.5)
nRBC: 0 % (ref 0.0–0.2)

## 2023-01-22 LAB — BASIC METABOLIC PANEL
Anion gap: 7 (ref 5–15)
BUN: 16 mg/dL (ref 8–23)
CO2: 39 mmol/L — ABNORMAL HIGH (ref 22–32)
Calcium: 8.5 mg/dL — ABNORMAL LOW (ref 8.9–10.3)
Chloride: 91 mmol/L — ABNORMAL LOW (ref 98–111)
Creatinine, Ser: 0.54 mg/dL — ABNORMAL LOW (ref 0.61–1.24)
GFR, Estimated: >60 mL/min (ref >60)
Glucose, Bld: 152 mg/dL — ABNORMAL HIGH (ref 70–99)
Potassium: 4.1 mmol/L (ref 3.5–5.1)
Sodium: 137 mmol/L (ref 135–145)

## 2023-01-22 NOTE — Progress Notes (Signed)
PROGRESS NOTE     David Stein, is a 77 y.o. male, DOB - [birth year], HQI:[id number]  Admit date - 01/21/2023   Admitting Physician David Boer, MD  Outpatient Primary MD for the patient is David Nevins, MD  LOS - 0  Chief Complaint  Patient presents with   Shortness of Breath      Brief Narrative:  77 y.o. 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  COVID negative.  - CT chest with contrast-  increased peripheral consolidation within the dependent left lower lobe, favor progressive atelectasis/scarring over airspace disease or neoplasm. -Outpatient repeat/additional imaging performed to evaluate the right pulmonary nodule. --c/n iv Solumedrol and Doxycycline- C/n -DuoNebs, albuterol nebs  2)Hematuria Chronic intermittent hematuria.  Hemoglobin stable at 11.  On Eliquis.   -Follows with urologist Dr. Ronne Stein.  3)Chronic Respiratory Failure with hypoxia and hypercapnia (HCC) Chronic respiratory failure on 4 L.  No increased O2 requirement.  Sats currently 91 to 100% on 3 1/2 L.  4) AbNormal CT chest with pulmonary nodules and Progressive bibasilar bronchiectasis, scarring, and fibrosis.  -- Outpatient follow-up pulmonologist for repeat imaging and further evaluation advised  5)Atrial flutter -- Rate controlled on anticoagulation with Eliquis. -c/n  Eliquis, Cardizem, amiodarone  6)Hypothyroidism Resume Synthroid.  Disposition: The patient is from: Home              Anticipated d/c is to: Home              Anticipated d/c date is: 1 day              Patient currently is not medically stable to d/c. Barriers: Not Clinically Stable-   Code Status :  -  Code Status: Limited: Do not attempt resuscitation  (DNR) -DNR-LIMITED -Do Not Intubate/DNI    Family Communication:    NA (patient is alert, awake and coherent)   DVT Prophylaxis  :   - SCDs  apixaban (ELIQUIS) tablet 5 mg   Lab Results  Component Value Date   PLT 184 01/22/2023   Inpatient Medications  Scheduled Meds:  amiodarone  200 mg Oral Daily   apixaban  5 mg Oral BID   diltiazem  240 mg Oral Daily   finasteride  5 mg Oral Daily   furosemide  20 mg Oral BID   guaiFENesin-dextromethorphan  15 mL Oral Q8H   ipratropium-albuterol  3 mL Nebulization QID   levothyroxine  50 mcg Oral Q0600   methylPREDNISolone (SOLU-MEDROL) injection  60 mg Intravenous Q12H   Followed by   Melene Muller ON 01/23/2023] predniSONE  40 mg Oral Q breakfast   mometasone-formoterol  2 puff Inhalation BID   polyethylene glycol  17 g Oral Daily   [START ON 01/27/2023] predniSONE  10 mg Oral Q breakfast   rosuvastatin  10 mg Oral Daily   senna  1 tablet Oral Daily   tamsulosin  0.4 mg Oral QPC supper   umeclidinium bromide  1 puff Inhalation Daily   Continuous Infusions:  doxycycline (VIBRAMYCIN) IV 100 mg (01/22/23 1118)   PRN Meds:.acetaminophen **OR** acetaminophen, albuterol, ALPRAZolam, methocarbamol, polyethylene glycol   Anti-infectives (From admission, onward)    Start     Dose/Rate Route Frequency Ordered Stop   01/21/23 2230  doxycycline (VIBRAMYCIN)  100 mg in dextrose 5 % 250 mL IVPB        100 mg 125 mL/hr over 120 Minutes Intravenous Every 12 hours 01/21/23 2225         Subjective: David Stein today has no fevers, no emesis,  No chest pain,   - Cough and dyspnea improving slowly   Objective: Vitals:   01/22/23 1130 01/22/23 1145 01/22/23 1200 01/22/23 1204  BP: (!) 121/58 135/61 (!) 130/58   Pulse: 72 73 72   Resp: 13 15 15    Temp:      TempSrc:      SpO2: 99% 97% 98% 97%  Weight:      Height:        Intake/Output Summary (Last 24 hours) at 01/22/2023 1317 Last data filed at 01/22/2023 1225 Gross per 24 hour  Intake  300 ml  Output 700 ml  Net -400 ml   Filed Weights   01/21/23 1527  Weight: 90.7 kg    Physical Exam  Gen:- Awake Alert, no significant conversational dyspnea HEENT:- [place].AT, No sclera icterus Nose- [place] 5L/min Neck-Supple Neck,No JVD,.  Lungs-somewhat diminished breath sounds, no significant wheezing  CV- S1, S2 normal, regular  Abd-  +ve B.Sounds, Abd Soft, No tenderness,    Extremity/Skin:- trace edema, pedal pulses present , right buttock stage I decubitus ulcer noted- Psych-affect is appropriate, oriented x3 Neuro-no new focal deficits, no tremors  Data Reviewed: I have personally reviewed following labs and imaging studies  CBC: Recent Labs  Lab 01/21/23 1554 01/22/23 0551  WBC 11.2* 9.1  HGB 11.0* 11.1*  HCT 38.8* 38.4*  MCV 102.6* 103.8*  PLT 220 184   Basic Metabolic Panel: Recent Labs  Lab 01/21/23 1554 01/22/23 0551  NA 141 137  K 3.8 4.1  CL 89* 91*  CO2 43* 39*  GLUCOSE 100* 152*  BUN 17 16  CREATININE 0.74 0.54*  CALCIUM 8.9 8.5*   GFR: Estimated Creatinine Clearance: 87.4 mL/min (A) (by C-G formula based on SCr of 0.54 mg/dL (L)). Recent Results (from the past 240 hour(s))  SARS Coronavirus 2 by RT PCR (hospital order, performed in [facility] hospital lab) *cepheid single result test* Anterior Nasal Swab     Status: None   Collection Time: 01/21/23  3:30 PM   Specimen: Anterior Nasal Swab  Result Value Ref Range Status   SARS Coronavirus 2 by RT PCR NEGATIVE NEGATIVE Final    Comment: (NOTE) SARS-CoV-2 target nucleic acids are NOT DETECTED.  The SARS-CoV-2 RNA is generally detectable in upper and lower respiratory specimens during the acute phase of infection. The lowest concentration of SARS-CoV-2 viral copies this assay can detect is 250 copies / mL. A negative result does not preclude SARS-CoV-2 infection and should not be used as the sole basis for treatment or other patient management decisions.  A negative result may occur  with improper specimen collection / handling, submission of specimen other than nasopharyngeal swab, presence of viral mutation(s) within the areas targeted by this assay, and inadequate number of viral copies (<250 copies / mL). A negative result must be combined with clinical observations, patient history, and epidemiological information.  Fact Sheet for Patients:   RoadLapTop.co.za  Fact Sheet for Healthcare Providers: http://kim-miller.com/  This test is not yet approved or  cleared by the [place] FDA and has been authorized for detection and/or diagnosis of SARS-CoV-2 by FDA under an Emergency Use Authorization (EUA).  This EUA will remain in effect (meaning this test can  be used) for the duration of the COVID-19 declaration under Section 564(b)(1) of the Act, 21 U.S.C. section 360bbb-3(b)(1), unless the authorization is terminated or revoked sooner.  Performed at [facility], [address], [place], [place] [address]   Culture, blood (routine x 2)     Status: None (Preliminary result)   Collection Time: 01/21/23  6:34 PM   Specimen: BLOOD LEFT FOREARM  Result Value Ref Range Status   Specimen Description   Final    BLOOD LEFT FOREARM Performed at [facility], [address] [place], [place] [address]    Special Requests   Final    BOTTLES DRAWN AEROBIC AND ANAEROBIC Blood Culture results may not be optimal due to an inadequate volume of blood received in culture bottles Performed at [facility], [address] [place], [place] [address]    Culture   Final    NO GROWTH < 24 HOURS Performed at [facility], [address], [place], [place] [address]    Report Status PENDING  Incomplete  Culture, blood (routine x 2)     Status: None (Preliminary result)   Collection Time: 01/21/23  6:34 PM   Specimen: Left Antecubital; Blood  Result Value Ref Range Status   Specimen Description   Final    LEFT  ANTECUBITAL Performed at [facility], [address], [place], [place] [address]    Special Requests   Final    Blood Culture results may not be optimal due to an excessive volume of blood received in culture bottles Performed at [facility], [address] [place], [place] [address]    Culture   Final    NO GROWTH < 24 HOURS Performed at [facility], [address] [place], [place] [address]    Report Status PENDING  Incomplete    Radiology Studies: CT Chest W Contrast  Result Date: 01/21/2023 CLINICAL DATA:  Increasing shortness of breath, cough, weakness EXAM: CT CHEST WITH CONTRAST TECHNIQUE: Multidetector CT imaging of the chest was performed during intravenous contrast administration. RADIATION DOSE REDUCTION: This exam was performed according to the departmental dose-optimization program which includes automated exposure control, adjustment of the mA and/or kV according to patient size and/or use of iterative reconstruction technique. CONTRAST:  75mL OMNIPAQUE IOHEXOL 300 MG/ML  SOLN COMPARISON:  09/26/2020, 01/21/2023 FINDINGS: Cardiovascular: The heart is unremarkable without pericardial effusion. No evidence of thoracic aortic aneurysm or dissection. Atherosclerosis of the aorta and coronary vasculature. Mediastinum/Nodes: No enlarged mediastinal, hilar, or axillary lymph nodes. Thyroid gland, trachea, and esophagus demonstrate no significant findings. Lungs/Pleura: Subpleural spiculated nodule within the right upper lobe reference image 72/3, measures 12 x 9 mm. This corresponds to the nodular right perihilar density on preceding chest x-Tres. Peripheral subpleural consolidation within the left lower lobe could reflect airspace disease atelectasis, or scarring. This is new since prior chest CT. No other acute airspace disease. 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 Bilateral lower lobe bronchiectasis is identified, with areas of subpleural scarring and fibrosis which  have progressed since prior CT. Numerous punctate calcified granulomas are seen throughout the bilateral lungs. No effusion or pneumothorax. Upper Abdomen: Nonobstructing left renal calculi are identified measuring up to 10 mm. No other acute upper abdominal findings. Musculoskeletal: No acute or destructive bony abnormalities. Reconstructed images demonstrate no additional findings. IMPRESSION: 1. Suspicious right solid pulmonary nodule within the upper lobe measuring 12 mm. Per Fleischner Society Guidelines, consider a non-contrast Chest CT at 3 months, a PET/CT, or  tissue sampling. These guidelines do not apply to immunocompromised patients and patients with cancer. Follow up in patients with significant comorbidities as clinically warranted. For lung cancer screening, adhere to Lung-RADS guidelines. Reference: Radiology. 2017; 284(1):228-43. 2. Increased peripheral consolidation within the dependent left lower lobe, favor progressive atelectasis/scarring over airspace disease or neoplasm. This could be re-evaluated at the time of additional imaging performed to evaluate the right pulmonary nodule. 3. Progressive bibasilar bronchiectasis, scarring, and fibrosis. 4. Aortic Atherosclerosis (ICD10-I70.0) and Emphysema (ICD10-J43.9). 5. Nonobstructing left renal calculi. Electronically Signed   By: Sharlet Salina M.D.   On: 01/21/2023 20:46   DG Chest 2 View  Result Date: 01/21/2023 CLINICAL DATA:  Increased shortness of breath.  COPD.  Cough. EXAM: CHEST - 2 VIEW COMPARISON:  12/21/2021. FINDINGS: There is a faint approximately 4.5 x 6.0 cm opacity overlying the right lower lung zone, which may represent summation artifact from overlying soft tissues versus lung mass/consolidation, less likely. No corresponding opacity is seen on the lateral film. There is also smaller but similar characteristic opacity overlying the left lower lung zone, favoring probability of artifact from overlying soft tissues. Bilateral  lung fields are otherwise clear. Bilateral lungs appear hyperexpanded and hyperlucent with coarse bronchovascular markings, in keeping with COPD. There also multiple scattered sub 5 mm calcified granulomas throughout bilateral lungs. There are additional new subcentimeter nodular opacities overlying the right mid lung zone. Bilateral costophrenic angles are clear. Normal cardio-mediastinal silhouette. No acute osseous abnormalities. The soft tissues are within normal limits. IMPRESSION: *COPD. *There are faint opacities overlying the bilateral lower lung zones, which are favored to represent artifact versus lung mass/consolidation, as discussed above. There also additional new subcentimeter nodular opacities overlying the right mid lung zone. Further evaluation with chest CT scan is recommended. Electronically Signed   By: Jules Schick M.D.   On: 01/21/2023 16:58    Scheduled Meds:  amiodarone  200 mg Oral Daily   apixaban  5 mg Oral BID   diltiazem  240 mg Oral Daily   finasteride  5 mg Oral Daily   furosemide  20 mg Oral BID   guaiFENesin-dextromethorphan  15 mL Oral Q8H   ipratropium-albuterol  3 mL Nebulization QID   levothyroxine  50 mcg Oral Q0600   methylPREDNISolone (SOLU-MEDROL) injection  60 mg Intravenous Q12H   Followed by   Melene Muller ON 01/23/2023] predniSONE  40 mg Oral Q breakfast   mometasone-formoterol  2 puff Inhalation BID   polyethylene glycol  17 g Oral Daily   [START ON 01/27/2023] predniSONE  10 mg Oral Q breakfast   rosuvastatin  10 mg Oral Daily   senna  1 tablet Oral Daily   tamsulosin  0.4 mg Oral QPC supper   umeclidinium bromide  1 puff Inhalation Daily   Continuous Infusions:  doxycycline (VIBRAMYCIN) IV 100 mg (01/22/23 1118)    LOS: 0 days   Shon Hale M.D on 01/22/2023 at 1:17 PM  Go to [email or web address] - for contact info  [facility] - Office  [phone]  If 7PM-7AM, please contact night-coverage [email or web address] 01/22/2023, 1:17 PM

## 2023-01-22 NOTE — ED Notes (Signed)
ED TO INPATIENT HANDOFF REPORT  ED Nurse Name and Phone #: [phone]  S Name/Age/Gender David Stein 77 y.o. male Room/Bed: APA04/APA04  Code Status   Code Status: Limited: Do not attempt resuscitation (DNR) -DNR-LIMITED -Do Not Intubate/DNI   Home/SNF/Other Home Patient oriented to: self, place, time, and situation Is this baseline? Yes   Triage Complete: Triage complete  Chief Complaint COPD with acute exacerbation (HCC) [J44.1]  Triage Note Pt presents with several days of increased SOB, cough, and weakness, chronically on 4 liters of O2, history of COPD and Emphysema.   Allergies Allergies  Allergen Reactions   Codeine Other (See Comments)    headache    Level of Care/Admitting Diagnosis ED Disposition     ED Disposition  Admit   Condition  --   Comment  Hospital Area: [facility] [[id number]]  Level of Care: Telemetry [5]  Covid Evaluation: Asymptomatic - no recent exposure (last 10 days) testing not required  Diagnosis: COPD with acute exacerbation [facility]) [[id number]]  Admitting Physician: Onnie Boer [phone]  Attending Physician: Onnie Boer [email or web address]          B Medical/Surgery History Past Medical History:  Diagnosis Date   Arthritis    Atrial flutter (HCC)    COPD (chronic obstructive pulmonary disease) (HCC)    Kidney stones    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 Nile Riggs, MD;  Location: [facility];  Service: Ophthalmology;  Laterality: Right;   CATARACT EXTRACTION W/PHACO Left 02/14/2014   Procedure: CATARACT EXTRACTION PHACO AND INTRAOCULAR LENS PLACEMENT; CDE:  8.34;  Surgeon: Loraine Leriche T. Nile Riggs, MD;  Location: [facility];  Service: Ophthalmology;  Laterality: Left;   KNEE SURGERY Left       A IV Location/Drains/Wounds Patient Lines/Drains/Airways Status     Active Line/Drains/Airways     Name Placement date Placement time Site Days   Peripheral IV 01/21/23 20 G 1" Anterior;Distal;Right;Upper Arm 01/21/23  1745  Arm  1   Peripheral IV 01/21/23 22 G 1" Left;Posterior Forearm 01/21/23  1810  Forearm  1            Intake/Output Last 24 hours  Intake/Output Summary (Last 24 hours) at 01/22/2023 1443 Last data filed at 01/22/2023 1225 Gross per 24 hour  Intake 300 ml  Output 700 ml  Net -400 ml    Labs/Imaging Results for orders placed or performed during the hospital encounter of 01/21/23 (from the past 48 hour(s))  SARS Coronavirus 2 by RT PCR (hospital order, performed in [facility] hospital lab) *cepheid single result test* Anterior Nasal Swab     Status: None   Collection Time: 01/21/23  3:30 PM   Specimen: Anterior Nasal Swab  Result Value Ref Range   SARS Coronavirus 2 by RT PCR NEGATIVE NEGATIVE    Comment: (NOTE) SARS-CoV-2 target nucleic acids are NOT DETECTED.  The SARS-CoV-2 RNA is generally detectable in upper and lower respiratory specimens during the acute phase of infection. The lowest concentration of SARS-CoV-2 viral copies this assay can detect is 250 copies / mL. A negative result does not preclude SARS-CoV-2 infection and should not be used as the sole basis for treatment or other patient management decisions.  A negative result may  occur with improper specimen collection / handling, submission of specimen other than nasopharyngeal swab, presence of viral mutation(s) within the areas targeted by this assay, and inadequate number of viral copies (<250 copies / mL). A negative result must be combined with clinical observations, patient history, and epidemiological information.  Fact Sheet for Patients:   RoadLapTop.co.za  Fact Sheet for Healthcare  Providers: http://kim-miller.com/  This test is not yet approved or  cleared by the [place] FDA and has been authorized for detection and/or diagnosis of SARS-CoV-2 by FDA under an Emergency Use Authorization (EUA).  This EUA will remain in effect (meaning this test can be used) for the duration of the COVID-19 declaration under Section 564(b)(1) of the Act, 21 U.S.C. section 360bbb-3(b)(1), unless the authorization is terminated or revoked sooner.  Performed at [facility], [address] [place], [place] [address]   Urinalysis, Routine w reflex microscopic -Urine, Clean Catch     Status: Abnormal   Collection Time: 01/21/23  3:34 PM  Result Value Ref Range   Color, Urine AMBER (A) YELLOW    Comment: BIOCHEMICALS MAY BE AFFECTED BY COLOR   APPearance CLOUDY (A) CLEAR   Specific Gravity, Urine 1.024 1.005 - 1.030   pH 6.0 5.0 - 8.0   Glucose, UA NEGATIVE NEGATIVE mg/dL   Hgb urine dipstick LARGE (A) NEGATIVE   Bilirubin Urine NEGATIVE NEGATIVE   Ketones, ur NEGATIVE NEGATIVE mg/dL   Protein, ur 086 (A) NEGATIVE mg/dL   Nitrite NEGATIVE NEGATIVE   Leukocytes,Ua NEGATIVE NEGATIVE   RBC / HPF >50 0 - 5 RBC/hpf   WBC, UA 0-5 0 - 5 WBC/hpf   Bacteria, UA NONE SEEN NONE SEEN   Squamous Epithelial / HPF 0-5 0 - 5 /HPF   Mucus PRESENT     Comment: Performed at [facility], [address] [place], [place] [address]  Basic metabolic panel     Status: Abnormal   Collection Time: 01/21/23  3:54 PM  Result Value Ref Range   Sodium 141 135 - 145 mmol/L   Potassium 3.8 3.5 - 5.1 mmol/L   Chloride 89 (L) 98 - 111 mmol/L   CO2 43 (H) 22 - 32 mmol/L   Glucose, Bld 100 (H) 70 - 99 mg/dL    Comment: Glucose reference range applies only to samples taken after fasting for at least 8 hours.   BUN 17 8 - 23 mg/dL   Creatinine, Ser 9.62 0.61 - 1.24 mg/dL   Calcium 8.9 8.9 - 95.2 mg/dL   GFR, Estimated >84 >13 mL/min    Comment: (NOTE) Calculated using the CKD-EPI  Creatinine Equation (2021)    Anion gap 9 5 - 15    Comment: Performed at [facility], [address], [place], [place] [address]  CBC     Status: Abnormal   Collection Time: 01/21/23  3:54 PM  Result Value Ref Range   WBC 11.2 (H) 4.0 - 10.5 K/uL   RBC 3.78 (L) 4.22 - 5.81 MIL/uL   Hemoglobin 11.0 (L) 13.0 - 17.0 g/dL   HCT 02.7 (L) 25.3 - 66.4 %   MCV 102.6 (H) 80.0 - 100.0 fL   MCH 29.1 26.0 - 34.0 pg   MCHC 28.4 (L) 30.0 - 36.0 g/dL   RDW 40.3 47.4 - 25.9 %   Platelets 220 150 - 400 K/uL   nRBC 0.0 0.0 - 0.2 %    Comment: Performed at [facility], [address], [place], [place] [address]  Troponin I (  High Sensitivity)     Status: None   Collection Time: 01/21/23  3:54 PM  Result Value Ref Range   Troponin I (High Sensitivity) 8 <18 ng/L    Comment: (NOTE) Elevated high sensitivity troponin I (hsTnI) values and significant  changes across serial measurements may suggest ACS but many other  chronic and acute conditions are known to elevate hsTnI results.  Refer to the "Links" section for chest pain algorithms and additional  guidance. Performed at [facility], [address], [place], [place] [address]   Troponin I (High Sensitivity)     Status: None   Collection Time: 01/21/23  6:34 PM  Result Value Ref Range   Troponin I (High Sensitivity) 9 <18 ng/L    Comment: (NOTE) Elevated high sensitivity troponin I (hsTnI) values and significant  changes across serial measurements may suggest ACS but many other  chronic and acute conditions are known to elevate hsTnI results.  Refer to the "Links" section for chest pain algorithms and additional  guidance. Performed at [facility], [address], [place], [place] [address]   Culture, blood (routine x 2)     Status: None (Preliminary result)   Collection Time: 01/21/23  6:34 PM   Specimen: BLOOD LEFT FOREARM  Result Value Ref Range   Specimen Description      BLOOD LEFT FOREARM Performed at [facility], [address], [place], [place] [address]    Special Requests      BOTTLES DRAWN AEROBIC AND ANAEROBIC Blood Culture results may not be optimal due to an inadequate volume of blood received in culture bottles Performed at [facility], [address] [place], [place] [address]    Culture      NO GROWTH < 24 HOURS Performed at [facility], [address] [place], [place] [address]    Report Status PENDING   Culture, blood (routine x 2)     Status: None (Preliminary result)   Collection Time: 01/21/23  6:34 PM   Specimen: Left Antecubital; Blood  Result Value Ref Range   Specimen Description      LEFT ANTECUBITAL Performed at [facility], [address] [place], [place] [address]    Special Requests      Blood Culture results may not be optimal due to an excessive volume of blood received in culture bottles Performed at [facility], [address], [place], [place] [address]    Culture      NO GROWTH < 24 HOURS Performed at [facility], [address] [place], [place] [address]    Report Status PENDING   Lactic acid, plasma     Status: None   Collection Time: 01/21/23  6:34 PM  Result Value Ref Range   Lactic Acid, Venous 0.8 0.5 - 1.9 mmol/L    Comment: Performed at [facility], [address], [place], [place] [address]  Brain natriuretic peptide     Status: None   Collection Time: 01/21/23  6:34 PM  Result Value Ref Range   B Natriuretic Peptide 79.0 0.0 - 100.0 pg/mL    Comment: Performed at [facility], [address] [place], [place] [address]  Blood gas, venous     Status: Abnormal   Collection Time: 01/21/23  6:34 PM  Result Value Ref Range   pH, Ven 7.36 7.25 - 7.43   pCO2, Ven 95 (HH) 44 - 60 mmHg    Comment: CRITICAL RESULT CALLED TO, READ BACK BY AND VERIFIED  WITH: BELTON,C ON 01/21/23 AT 1845 BY LOY,C    pO2, Ven <31 (LL) 32 - 45 mmHg    Comment: CRITICAL RESULT CALLED TO, READ BACK BY AND VERIFIED WITH: BELTON,C ON 01/21/23 AT 1845 BY LOY,C     Bicarbonate 53.1 (H) 20.0 - 28.0 mmol/L   Acid-Base Excess 22.9 (H) 0.0 - 2.0 mmol/L   O2 Saturation 38.2 %   Patient temperature 37.2    Collection site LEFT ANTECUBITAL    Drawn by [phone]     Comment: Performed at [facility], [address], [place], [place] [address]  CBC     Status: Abnormal   Collection Time: 01/22/23  5:51 AM  Result Value Ref Range   WBC 9.1 4.0 - 10.5 K/uL   RBC 3.70 (L) 4.22 - 5.81 MIL/uL   Hemoglobin 11.1 (L) 13.0 - 17.0 g/dL   HCT 17.5 (L) 10.2 - 58.5 %   MCV 103.8 (H) 80.0 - 100.0 fL   MCH 30.0 26.0 - 34.0 pg   MCHC 28.9 (L) 30.0 - 36.0 g/dL   RDW 27.7 82.4 - 23.5 %   Platelets 184 150 - 400 K/uL   nRBC 0.0 0.0 - 0.2 %    Comment: Performed at [facility], [address] [place], [place] [address]  Basic metabolic panel     Status: Abnormal   Collection Time: 01/22/23  5:51 AM  Result Value Ref Range   Sodium 137 135 - 145 mmol/L   Potassium 4.1 3.5 - 5.1 mmol/L   Chloride 91 (L) 98 - 111 mmol/L   CO2 39 (H) 22 - 32 mmol/L   Glucose, Bld 152 (H) 70 - 99 mg/dL    Comment: Glucose reference range applies only to samples taken after fasting for at least 8 hours.   BUN 16 8 - 23 mg/dL   Creatinine, Ser 3.15 (L) 0.61 - 1.24 mg/dL   Calcium 8.5 (L) 8.9 - 10.3 mg/dL   GFR, Estimated >40 >08 mL/min    Comment: (NOTE) Calculated using the CKD-EPI Creatinine Equation (2021)    Anion gap 7 5 - 15    Comment: Performed at [facility], [address] [place], [place] [address]   CT Chest W Contrast  Result Date: 01/21/2023 CLINICAL DATA:  Increasing shortness of breath, cough, weakness EXAM: CT CHEST WITH CONTRAST TECHNIQUE: Multidetector CT imaging of the chest was performed during intravenous contrast administration. RADIATION DOSE REDUCTION: This exam was performed according to the departmental dose-optimization program which includes automated exposure control, adjustment of the mA and/or kV according to patient size and/or use of iterative  reconstruction technique. CONTRAST:  75mL OMNIPAQUE IOHEXOL 300 MG/ML  SOLN COMPARISON:  09/26/2020, 01/21/2023 FINDINGS: Cardiovascular: The heart is unremarkable without pericardial effusion. No evidence of thoracic aortic aneurysm or dissection. Atherosclerosis of the aorta and coronary vasculature. Mediastinum/Nodes: No enlarged mediastinal, hilar, or axillary lymph nodes. Thyroid gland, trachea, and esophagus demonstrate no significant findings. Lungs/Pleura: Subpleural spiculated nodule within the right upper lobe reference image 72/3, measures 12 x 9 mm. This corresponds to the nodular right perihilar density on preceding chest x-Johnthan. Peripheral subpleural consolidation within the left lower lobe could reflect airspace disease atelectasis, or scarring. This is new since prior chest CT. No other acute airspace disease. 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 Numerous punctate calcified granulomas are seen throughout the bilateral lungs. No effusion or  pneumothorax. Upper Abdomen: Nonobstructing left renal calculi are identified measuring up to 10 mm. No other acute upper abdominal findings. Musculoskeletal: No acute or destructive bony abnormalities. Reconstructed images demonstrate no additional findings. IMPRESSION: 1. Suspicious right solid pulmonary nodule within the upper lobe measuring 12 mm. Per Fleischner Society Guidelines, consider a non-contrast Chest CT at 3 months, a PET/CT, or tissue sampling. These guidelines do not apply to immunocompromised patients and patients with cancer. Follow up in patients with significant comorbidities as clinically warranted. For lung cancer screening, adhere to Lung-RADS guidelines. Reference: Radiology. 2017; 284(1):228-43. 2. Increased peripheral consolidation within the dependent left lower lobe, favor progressive  atelectasis/scarring over airspace disease or neoplasm. This could be re-evaluated at the time of additional imaging performed to evaluate the right pulmonary nodule. 3. Progressive bibasilar bronchiectasis, scarring, and fibrosis. 4. Aortic Atherosclerosis (ICD10-I70.0) and Emphysema (ICD10-J43.9). 5. Nonobstructing left renal calculi. Electronically Signed   By: Sharlet Salina M.D.   On: 01/21/2023 20:46   DG Chest 2 View  Result Date: 01/21/2023 CLINICAL DATA:  Increased shortness of breath.  COPD.  Cough. EXAM: CHEST - 2 VIEW COMPARISON:  12/21/2021. FINDINGS: There is a faint approximately 4.5 x 6.0 cm opacity overlying the right lower lung zone, which may represent summation artifact from overlying soft tissues versus lung mass/consolidation, less likely. No corresponding opacity is seen on the lateral film. There is also smaller but similar characteristic opacity overlying the left lower lung zone, favoring probability of artifact from overlying soft tissues. Bilateral lung fields are otherwise clear. Bilateral lungs appear hyperexpanded and hyperlucent with coarse bronchovascular markings, in keeping with COPD. There also multiple scattered sub 5 mm calcified granulomas throughout bilateral lungs. There are additional new subcentimeter nodular opacities overlying the right mid lung zone. Bilateral costophrenic angles are clear. Normal cardio-mediastinal silhouette. No acute osseous abnormalities. The soft tissues are within normal limits. IMPRESSION: *COPD. *There are faint opacities overlying the bilateral lower lung zones, which are favored to represent artifact versus lung mass/consolidation, as discussed above. There also additional new subcentimeter nodular opacities overlying the right mid lung zone. Further evaluation with chest CT scan is recommended. Electronically Signed   By: Jules Schick M.D.   On: 01/21/2023 16:58    Pending Labs Unresulted Labs (From admission, onward)    None        Vitals/Pain Today's Vitals   01/22/23 1300 01/22/23 1315 01/22/23 1400 01/22/23 1430  BP: 137/65 122/61 122/61 128/64  Pulse:      Resp: (!) 26 18 14 20   Temp:      TempSrc:      SpO2:      Weight:      Height:      PainSc:        Isolation Precautions No active isolations  Medications Medications  albuterol (PROVENTIL) (2.5 MG/3ML) 0.083% nebulizer solution 2.5 mg (has no administration in time range)  doxycycline (VIBRAMYCIN) 100 mg in dextrose 5 % 250 mL IVPB (100 mg Intravenous New Bag/Given 01/22/23 1118)  ALPRAZolam (XANAX) tablet 0.5 mg (has no administration in time range)  amiodarone (PACERONE) tablet 200 mg (200 mg Oral Given 01/22/23 1219)  apixaban (ELIQUIS) tablet 5 mg (5 mg Oral Given 01/22/23 1218)  diltiazem (CARDIZEM CD) 24 hr capsule 240 mg (240 mg Oral Given 01/22/23 1218)  finasteride (PROSCAR) tablet 5 mg (5 mg Oral Given 01/22/23 1218)  furosemide (LASIX) tablet 20 mg (20 mg Oral Given 01/22/23 0808)  polyethylene glycol (MIRALAX / GLYCOLAX) packet 17 g (  17 g Oral Given 01/22/23 1219)  rosuvastatin (CRESTOR) tablet 10 mg (10 mg Oral Given 01/22/23 1218)  senna (SENOKOT) tablet 8.6 mg (8.6 mg Oral Not Given 01/22/23 1223)  tamsulosin (FLOMAX) capsule 0.4 mg (has no administration in time range)  acetaminophen (TYLENOL) tablet 650 mg (has no administration in time range)    Or  acetaminophen (TYLENOL) suppository 650 mg (has no administration in time range)  polyethylene glycol (MIRALAX / GLYCOLAX) packet 17 g (has no administration in time range)  ipratropium-albuterol (DUONEB) 0.5-2.5 (3) MG/3ML nebulizer solution 3 mL (3 mLs Nebulization Given 01/22/23 1231)  methocarbamol (ROBAXIN) tablet 500 mg (has no administration in time range)  guaiFENesin-dextromethorphan (ROBITUSSIN DM) 100-10 MG/5ML syrup 15 mL (15 mLs Oral Given 01/22/23 0808)  mometasone-formoterol (DULERA) 100-5 MCG/ACT inhaler 2 puff (2 puffs Inhalation Not Given 01/22/23 1046)   umeclidinium bromide (INCRUSE ELLIPTA) 62.5 MCG/ACT 1 puff (1 puff Inhalation Not Given 01/22/23 1046)  levothyroxine (SYNTHROID) tablet 50 mcg (50 mcg Oral Given 01/22/23 0807)  methylPREDNISolone sodium succinate (SOLU-MEDROL) 125 mg/2 mL injection 60 mg (60 mg Intravenous Given 01/22/23 0808)    Followed by  predniSONE (DELTASONE) tablet 40 mg (has no administration in time range)  predniSONE (DELTASONE) tablet 10 mg (has no administration in time range)  methylPREDNISolone sodium succinate (SOLU-MEDROL) 125 mg/2 mL injection 125 mg (125 mg Intravenous Given 01/21/23 1747)  magnesium sulfate IVPB 2 g 50 mL (0 g Intravenous Stopped 01/21/23 1849)  ipratropium-albuterol (DUONEB) 0.5-2.5 (3) MG/3ML nebulizer solution 3 mL (3 mLs Nebulization Given 01/21/23 1749)  iohexol (OMNIPAQUE) 300 MG/ML solution 75 mL (75 mLs Intravenous Contrast Given 01/21/23 1925)    Mobility power wheelchair     Focused Assessments    R Recommendations: See Admitting Provider Note  Report given to:   Additional Notes:

## 2023-01-22 NOTE — Progress Notes (Addendum)
   01/22/23 1408  TOC Brief Assessment  Insurance and Status Reviewed  Patient has primary care physician Yes  Home environment has been reviewed Home  Prior level of function: with spouse  Prior/Current Home Services No current home services  Social Determinants of Health Reivew SDOH reviewed no interventions necessary  Readmission risk has been reviewed Yes  Transition of care needs no transition of care needs at this time   Patient in ED, continuing medical work up.  Transition of Care Department [facility]) has reviewed patient and no TOC needs have been identified at this time. We will continue to monitor patient advancement through interdisciplinary progression rounds. If new patient transition needs arise, please place a TOC consult.

## 2023-01-23 DIAGNOSIS — J441 Chronic obstructive pulmonary disease with (acute) exacerbation: Secondary | ICD-10-CM | POA: Diagnosis not present

## 2023-01-23 MED ORDER — PANTOPRAZOLE SODIUM 40 MG PO TBEC
40.0000 mg | DELAYED_RELEASE_TABLET | Freq: Once | ORAL | Status: AC
  Administered 2023-01-23: 40 mg via ORAL
  Filled 2023-01-23: qty 1

## 2023-01-23 MED ORDER — PREDNISONE 20 MG PO TABS: 40.0000 mg | ORAL_TABLET | Freq: Every day | ORAL | 0 refills | Status: AC

## 2023-01-23 MED ORDER — PANTOPRAZOLE SODIUM 40 MG PO TBEC: 40.0000 mg | DELAYED_RELEASE_TABLET | Freq: Every day | ORAL | 1 refills | Status: DC

## 2023-01-23 MED ORDER — CALCIUM CARBONATE ANTACID 500 MG PO CHEW
400.0000 mg | CHEWABLE_TABLET | Freq: Once | ORAL | Status: AC
  Administered 2023-01-23: 400 mg via ORAL
  Filled 2023-01-23: qty 2

## 2023-01-23 MED ORDER — DOXYCYCLINE HYCLATE 100 MG PO TABS
100.0000 mg | ORAL_TABLET | Freq: Two times a day (BID) | ORAL | 0 refills | Status: AC
Start: 2023-01-23 — End: 2023-01-28

## 2023-01-23 NOTE — Discharge Instructions (Addendum)
1)Avoid ibuprofen/Advil/Aleve/Motrin/Goody Powders/Naproxen/BC powders/Meloxicam/Diclofenac/Indomethacin and other Nonsteroidal anti-inflammatory medications as these will make you more likely to bleed and can cause stomach ulcers, can also cause Kidney problems.   2)You need oxygen at home at  4 L via nasal cannula continuously while awake and while asleep--- smoking or having open fires around oxygen can cause fire, significant injury and death  3) Suspicious right solid pulmonary nodule within the upper lobe measuring 12 mm.  Outpatient follow-up with pulmonologist for repeat non-contrast Chest CT at 3 months, a PET/CT, or tissue sampling recommended

## 2023-01-23 NOTE — Plan of Care (Signed)
  Problem: Education: Goal: Knowledge of disease or condition will improve Outcome: Adequate for Discharge Goal: Knowledge of the prescribed therapeutic regimen will improve Outcome: Adequate for Discharge Goal: Individualized Educational Video(s) Outcome: Adequate for Discharge   Problem: Activity: Goal: Ability to tolerate increased activity will improve Outcome: Adequate for Discharge Goal: Will verbalize the importance of balancing activity with adequate rest periods Outcome: Adequate for Discharge   Problem: Respiratory: Goal: Ability to maintain a clear airway will improve Outcome: Adequate for Discharge Goal: Levels of oxygenation will improve Outcome: Adequate for Discharge Goal: Ability to maintain adequate ventilation will improve Outcome: Adequate for Discharge   Problem: Education: Goal: Knowledge of General Education information will improve Description: Including pain rating scale, medication(s)/side effects and non-pharmacologic comfort measures Outcome: Adequate for Discharge   Problem: Health Behavior/Discharge Planning: Goal: Ability to manage health-related needs will improve Outcome: Adequate for Discharge   Problem: Clinical Measurements: Goal: Ability to maintain clinical measurements within normal limits will improve Outcome: Adequate for Discharge Goal: Will remain free from infection Outcome: Adequate for Discharge Goal: Diagnostic test results will improve Outcome: Adequate for Discharge Goal: Respiratory complications will improve Outcome: Adequate for Discharge Goal: Cardiovascular complication will be avoided Outcome: Adequate for Discharge   Problem: Activity: Goal: Risk for activity intolerance will decrease Outcome: Adequate for Discharge   Problem: Nutrition: Goal: Adequate nutrition will be maintained Outcome: Adequate for Discharge   Problem: Coping: Goal: Level of anxiety will decrease Outcome: Adequate for Discharge    Problem: Elimination: Goal: Will not experience complications related to bowel motility Outcome: Adequate for Discharge Goal: Will not experience complications related to urinary retention Outcome: Adequate for Discharge   Problem: Pain Management: Goal: General experience of comfort will improve Outcome: Adequate for Discharge   Problem: Safety: Goal: Ability to remain free from injury will improve Outcome: Adequate for Discharge   Problem: Skin Integrity: Goal: Risk for impaired skin integrity will decrease Outcome: Adequate for Discharge

## 2023-01-23 NOTE — Discharge Summary (Signed)
David Stein, is a 77 y.o. male  DOB 1945/06/19  MRN [id number].  Admission date:  01/21/2023  Admitting Physician  Shon Hale, MD  Discharge Date:  01/23/2023   Primary MD  Elfredia Nevins, MD  Recommendations for primary care physician for things to follow:   1)Avoid ibuprofen/Advil/Aleve/Motrin/Goody Powders/Naproxen/BC powders/Meloxicam/Diclofenac/Indomethacin and other Nonsteroidal anti-inflammatory medications as these will make you more likely to bleed and can cause stomach ulcers, can also cause Kidney problems.   2)You need oxygen at home at  4 L via nasal cannula continuously while awake and while asleep--- smoking or having open fires around oxygen can cause fire, significant injury and death  3) Suspicious right solid pulmonary nodule within the upper lobe measuring 12 mm.  Outpatient follow-up with pulmonologist for repeat non-contrast Chest CT at 3 months, a PET/CT, or tissue sampling recommended  Admission Diagnosis  Gross hematuria [R31.0] COPD exacerbation (HCC) [J44.1] Acute respiratory failure with hypercapnia (HCC) [J96.02] COPD with acute exacerbation (HCC) [J44.1]   Discharge Diagnosis  Gross hematuria [R31.0] COPD exacerbation (HCC) [J44.1] Acute respiratory failure with hypercapnia (HCC) [J96.02] COPD with acute exacerbation (HCC) [J44.1]    Principal Problem:   COPD exacerbation (HCC) Active Problems:   Stage 1 decubitus ulcer   Chronic anticoagulation   Atrial flutter (HCC)   Hypothyroidism   Chronic respiratory failure with hypoxia and hypercapnia (HCC)   Hematuria   COPD with acute exacerbation (HCC)      Past Medical History:  Diagnosis Date   Arthritis    Atrial flutter (HCC)    COPD (chronic obstructive pulmonary disease) (HCC)    Kidney stones    Oxygen dependent    2.5L   Restless leg syndrome    Shortness of breath dyspnea     Past Surgical  History:  Procedure Laterality Date   CATARACT EXTRACTION W/PHACO Right 01/31/2014   Procedure: CATARACT EXTRACTION PHACO AND INTRAOCULAR LENS PLACEMENT RIGHT EYE CDE=6.90;  Surgeon: Loraine Leriche T. Nile Riggs, MD;  Location: [facility];  Service: Ophthalmology;  Laterality: Right;   CATARACT EXTRACTION W/PHACO Left 02/14/2014   Procedure: CATARACT EXTRACTION PHACO AND INTRAOCULAR LENS PLACEMENT; CDE:  8.34;  Surgeon: Loraine Leriche T. Nile Riggs, MD;  Location: [facility];  Service: Ophthalmology;  Laterality: Left;   KNEE SURGERY Left      HPI  from the history and physical done on the day of admission:   Patient coming from: Home   I have personally briefly reviewed patient's old medical records in [facility] Link   Chief Complaint: Difficulty breathing   HPI: David Stein is a 76 y.o. 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 Presented to the ED with complaints of difficulty breathing, and productive cough with generalized weakness.  Patient reports over the past 2 days, he has had increasing difficulty breathing, with productive cough.  No chest pain.  He has chronic lower extremity swelling that is unchanged and actually improved today from baseline.  He is compliant with  his medications Eliquis and Lasix daily.  No change in mental status.  No confusion. He reports about once every week he has blood in his urine, for which he follows with urologist, Dr. Ronne Binning.    ED Course: O2 sats 91 to 100% on 3 and half liters, temperature 98.9.  Heart rate 74-82.  Respiratory 12-26.  Blood pressure systolic 117 - 145. VBG shows pH of 7.3, pCO2 elevated at 95.  Serum bicarb chronically elevated at 43. Chest x-Obrian - COPD, faint opacities overlying the bilateral lower lung zones- artifact versus lung mass/consolidation. Subsequent CT chest with contrast-  Increased peripheral consolidation within the dependent left lower lobe, favor progressive  atelectasis/scarring over airspace disease or neoplasm. This could be re-evaluated at the time of additional imaging performed to evaluate the right pulmonary nodule. 125 mg Solu-Medrol given, DuoNebs and albuterol nebs given.  Hospitalist admit for COPD exacerbation.   Review of Systems: As per HPI all other systems reviewed and negative.   Hospital Course:   Brief Narrative:  77 y.o. male with medical history significant for COPD with chronic respiratory failure 4 L, atrial flutter, restless leg syndrome admitted on 01/21/23 with Acute COPD Exacer----     -Assessment and Plan: 1)COPD Exacerbation in the setting of Chronic Hypoxic Resp Failure-- PTA was on on Prednisone 10 mg daily.  COVID negative.  - CT chest with contrast-  increased peripheral consolidation within the dependent left lower lobe, favor progressive atelectasis/scarring over airspace disease or neoplasm. -Outpatient repeat/additional imaging performed to evaluate the right pulmonary nodule advised -- Overall much improved after treatment with Solumedrol and Doxycycline- C/n -DuoNebs, albuterol nebs -Okay to discharge on prednisone and doxycycline   2)Hematuria Chronic intermittent hematuria.  Hemoglobin stable at 11.  On Eliquis.   -Follows with urologist Dr. Ronne Binning. --Driving at this time   3)Chronic Respiratory Failure with hypoxia and hypercapnia (HCC) Chronic respiratory failure on 4 L.   -Oxygen requirement is at baseline  4) AbNormal CT chest with pulmonary nodules and Progressive bibasilar bronchiectasis, scarring, and fibrosis.  -- Outpatient follow-up pulmonologist for repeat imaging and further evaluation advised   5)Atrial flutter -- -c/n  Eliquis for stroke prophylaxis and use Cardizem, amiodarone for rate control   6)Hypothyroidism Resume Synthroid.  7)GERD--- Protonix as prescribed especially while on steroids   Disposition: The patient is from: Home              Anticipated d/c is to:  Home  Discharge Condition: stable  Follow UP   Follow-up Information     [facility], [facility] Follow up.   Specialty: Home Health Services Why: PT /Aide will call to schedule your first home visit. Contact information: [address] [place] [address] [phone]         Elfredia Nevins, MD. Schedule an appointment as soon as possible for a visit in 1 week(s).   Specialty: Internal Medicine Contact information: [address] [place] [address] [phone]                 Diet and Activity recommendation:  As advised  Discharge Instructions    Discharge Instructions     Call MD for:  difficulty breathing, headache or visual disturbances   Complete by: As directed    Call MD for:  persistant dizziness or light-headedness   Complete by: As directed    Call MD for:  temperature >100.4   Complete by: As directed    Diet - low sodium heart  healthy   Complete by: As directed    Discharge instructions   Complete by: As directed    1)Avoid ibuprofen/Advil/Aleve/Motrin/Goody Powders/Naproxen/BC powders/Meloxicam/Diclofenac/Indomethacin and other Nonsteroidal anti-inflammatory medications as these will make you more likely to bleed and can cause stomach ulcers, can also cause Kidney problems.   2)You need oxygen at home at  4 L via nasal cannula continuously while awake and while asleep--- smoking or having open fires around oxygen can cause fire, significant injury and death  3) Suspicious right solid pulmonary nodule within the upper lobe measuring 12 mm.  Outpatient follow-up with pulmonologist for repeat non-contrast Chest CT at 3 months, a PET/CT, or tissue sampling recommended   Increase activity slowly   Complete by: As directed        Discharge Medications     Allergies as of 01/23/2023       Reactions   Codeine Other (See Comments)   headache        Medication List     TAKE these medications    acetaminophen 500  MG tablet Commonly known as: TYLENOL Take 500 mg by mouth every 6 (six) hours as needed for mild pain.   ALPRAZolam 0.5 MG tablet Commonly known as: XANAX Take 0.5 mg by mouth every 8 (eight) hours as needed for anxiety.   amiodarone 200 MG tablet Commonly known as: PACERONE Take 1 tablet (200 mg total) by mouth daily.   antiseptic oral rinse Liqd 15 mLs by Mouth Rinse route as needed for dry mouth.   apixaban 5 MG Tabs tablet Commonly known as: ELIQUIS Take 1 tablet (5 mg total) by mouth 2 (two) times daily.   Breztri Aerosphere 160-9-4.8 MCG/ACT Aero Generic drug: Budeson-Glycopyrrol-Formoterol Inhale 2 puffs into the lungs in the morning and at bedtime.   carboxymethylcellulose 1 % ophthalmic solution Place 1 drop into both eyes daily as needed (dry eyes).   diltiazem 240 MG 24 hr capsule Commonly known as: CARDIZEM CD Take 1 capsule (240 mg total) by mouth daily.   doxycycline 100 MG tablet Commonly known as: VIBRA-TABS Take 1 tablet (100 mg total) by mouth 2 (two) times daily for 5 days.   finasteride 5 MG tablet Commonly known as: PROSCAR Take 1 tablet (5 mg total) by mouth daily.   fluticasone 50 MCG/ACT nasal spray Commonly known as: FLONASE Place 1 spray into both nostrils daily.   furosemide 20 MG tablet Commonly known as: LASIX Take 1 tablet (20 mg total) by mouth daily. What changed: when to take this   guaiFENesin 600 MG 12 hr tablet Commonly known as: MUCINEX Take 1 tablet (600 mg total) by mouth 2 (two) times daily.   HYDROcodone-acetaminophen 10-325 MG tablet Commonly known as: NORCO Take 1 tablet by mouth 4 (four) times daily as needed for moderate pain (pain score 4-6).   levothyroxine 50 MCG tablet Commonly known as: SYNTHROID Take 50 mcg by mouth daily before breakfast.   M-4 Knee High Stockings Misc 1 each by Does not apply route as directed.   MAGNESIUM CHLORIDE PO Take 1 tablet by mouth daily.   multivitamin with minerals Tabs  tablet Take 1 tablet by mouth daily.   OXYGEN Inhale 4-4.5 L into the lungs continuous.   pantoprazole 40 MG tablet Commonly known as: Protonix Take 1 tablet (40 mg total) by mouth daily.   polyethylene glycol 17 g packet Commonly known as: MIRALAX / GLYCOLAX Take 17 g by mouth daily.   predniSONE 10 MG tablet Commonly known as:  DELTASONE Take 10 mg by mouth daily with breakfast. What changed: Another medication with the same name was added. Make sure you understand how and when to take each.   predniSONE 20 MG tablet Commonly known as: DELTASONE Take 2 tablets (40 mg total) by mouth daily with breakfast for 5 days. What changed: You were already taking a medication with the same name, and this prescription was added. Make sure you understand how and when to take each.   ProAir HFA 108 (90 Base) MCG/ACT inhaler Generic drug: albuterol Inhale 1 puff into the lungs every 6 (six) hours as needed for wheezing or shortness of breath.   rosuvastatin 10 MG tablet Commonly known as: CRESTOR Take 10 mg by mouth daily.   senna 8.6 MG tablet Commonly known as: SENOKOT Take 1 tablet by mouth daily.   tamsulosin 0.4 MG Caps capsule Commonly known as: FLOMAX Take 1 capsule (0.4 mg total) by mouth 2 (two) times daily. What changed: when to take this       Major procedures and Radiology Reports - PLEASE review detailed and final reports for all details, in brief -   CT Chest W Contrast  Result Date: 01/21/2023 CLINICAL DATA:  Increasing shortness of breath, cough, weakness EXAM: CT CHEST WITH CONTRAST TECHNIQUE: Multidetector CT imaging of the chest was performed during intravenous contrast administration. RADIATION DOSE REDUCTION: This exam was performed according to the departmental dose-optimization program which includes automated exposure control, adjustment of the mA and/or kV according to patient size and/or use of iterative reconstruction technique. CONTRAST:  75mL OMNIPAQUE  IOHEXOL 300 MG/ML  SOLN COMPARISON:  09/26/2020, 01/21/2023 FINDINGS: Cardiovascular: The heart is unremarkable without pericardial effusion. No evidence of thoracic aortic aneurysm or dissection. Atherosclerosis of the aorta and coronary vasculature. Mediastinum/Nodes: No enlarged mediastinal, hilar, or axillary lymph nodes. Thyroid gland, trachea, and esophagus demonstrate no significant findings. Lungs/Pleura: Subpleural spiculated nodule within the right upper lobe reference image 72/3, measures 12 x 9 mm. This corresponds to the nodular right perihilar density on preceding chest x-[place]. Peripheral subpleural consolidation within the left lower lobe could reflect airspace disease atelectasis, or scarring. This is new since prior chest CT. No other acute airspace disease. 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 Numerous punctate calcified granulomas are seen throughout the bilateral lungs. No effusion or pneumothorax. Upper Abdomen: Nonobstructing left renal calculi are identified measuring up to 10 mm. No other acute upper abdominal findings. Musculoskeletal: No acute or destructive bony abnormalities. Reconstructed images demonstrate no additional findings. IMPRESSION: 1. Suspicious right solid pulmonary nodule within the upper lobe measuring 12 mm. Per Fleischner Society Guidelines, consider a non-contrast Chest CT at 3 months, a PET/CT, or tissue sampling. These guidelines do not apply to immunocompromised patients and patients with cancer. Follow up in patients with significant comorbidities as clinically warranted. For lung cancer screening, adhere to Lung-RADS guidelines. Reference: Radiology. 2017; 284(1):228-43. 2. Increased peripheral consolidation within the dependent left lower lobe, favor progressive atelectasis/scarring over airspace disease or neoplasm.  This could be re-evaluated at the time of additional imaging performed to evaluate the right pulmonary nodule. 3. Progressive bibasilar bronchiectasis, scarring, and fibrosis. 4. Aortic Atherosclerosis (ICD10-I70.0) and Emphysema (ICD10-J43.9). 5. Nonobstructing left renal calculi. Electronically Signed   By: Sharlet Salina M.D.   On: 01/21/2023 20:46   DG Chest 2 View  Result Date: 01/21/2023 CLINICAL DATA:  Increased shortness of  breath.  COPD.  Cough. EXAM: CHEST - 2 VIEW COMPARISON:  12/21/2021. FINDINGS: There is a faint approximately 4.5 x 6.0 cm opacity overlying the right lower lung zone, which may represent summation artifact from overlying soft tissues versus lung mass/consolidation, less likely. No corresponding opacity is seen on the lateral film. There is also smaller but similar characteristic opacity overlying the left lower lung zone, favoring probability of artifact from overlying soft tissues. Bilateral lung fields are otherwise clear. Bilateral lungs appear hyperexpanded and hyperlucent with coarse bronchovascular markings, in keeping with COPD. There also multiple scattered sub 5 mm calcified granulomas throughout bilateral lungs. There are additional new subcentimeter nodular opacities overlying the right mid lung zone. Bilateral costophrenic angles are clear. Normal cardio-mediastinal silhouette. No acute osseous abnormalities. The soft tissues are within normal limits. IMPRESSION: *COPD. *There are faint opacities overlying the bilateral lower lung zones, which are favored to represent artifact versus lung mass/consolidation, as discussed above. There also additional new subcentimeter nodular opacities overlying the right mid lung zone. Further evaluation with chest CT scan is recommended. Electronically Signed   By: Jules Schick M.D.   On: 01/21/2023 16:58    Micro Results   Recent Results (from the past 240 hour(s))  SARS Coronavirus 2 by RT PCR (hospital order, performed in [facility] hospital lab) *cepheid single result test* Anterior Nasal Swab     Status: None   Collection Time: 01/21/23  3:30 PM   Specimen: Anterior Nasal Swab  Result Value Ref Range Status   SARS Coronavirus 2 by RT PCR NEGATIVE NEGATIVE Final    Comment: (NOTE) SARS-CoV-2 target nucleic acids are NOT DETECTED.  The SARS-CoV-2 RNA is generally detectable in upper and lower respiratory specimens during the acute phase of infection. The lowest concentration of SARS-CoV-2 viral copies this assay can detect is 250 copies / mL. A negative result does not preclude SARS-CoV-2 infection and should not be used as the sole basis for treatment or other patient management decisions.  A negative result may occur with improper specimen collection / handling, submission of specimen other than nasopharyngeal swab, presence of viral mutation(s) within the areas targeted by this assay, and inadequate number of viral copies (<250 copies / mL). A negative result must be combined with clinical observations, patient history, and epidemiological information.  Fact Sheet for Patients:   RoadLapTop.co.za  Fact Sheet for Healthcare Providers: http://kim-miller.com/  This test is not yet approved or  cleared by the [place] FDA and has been authorized for detection and/or diagnosis of SARS-CoV-2 by FDA under an Emergency Use Authorization (EUA).  This EUA will remain in effect (meaning this test can be used) for the duration of the COVID-19 declaration under Section 564(b)(1) of the Act, 21 U.S.C. section 360bbb-3(b)(1), unless the authorization is terminated or revoked sooner.  Performed at [facility], [address], [place], [place] [address]   Culture, blood (routine x 2)     Status: None (Preliminary result)   Collection Time: 01/21/23  6:34 PM   Specimen: BLOOD LEFT FOREARM  Result Value Ref Range Status   Specimen Description BLOOD LEFT FOREARM   Final   Special Requests   Final    BOTTLES DRAWN AEROBIC AND ANAEROBIC Blood Culture results may not be optimal due to an inadequate volume of blood received in culture bottles   Culture   Final    NO GROWTH 2 DAYS Performed at [facility], [address], [place], [place] [address]  Report Status PENDING  Incomplete  Culture, blood (routine x 2)     Status: None (Preliminary result)   Collection Time: 01/21/23  6:34 PM   Specimen: Left Antecubital; Blood  Result Value Ref Range Status   Specimen Description LEFT ANTECUBITAL  Final   Special Requests   Final    Blood Culture results may not be optimal due to an excessive volume of blood received in culture bottles   Culture   Final    NO GROWTH 2 DAYS Performed at [facility], [address] [place], [place] [address]    Report Status PENDING  Incomplete    Today   Subjective    Pj Sainato today has no new concerns No fever  Or chills   No Nausea, Vomiting or Diarrhea -Cough and dyspnea has improved =-Patient had epigastric discomfort with nausea suspect GERD flareup in the setting of steroid therapy     Patient has been seen and examined prior to discharge   Objective   Blood pressure 109/70, pulse 74, temperature (!) 97.5 F (36.4 C), temperature source Oral, resp. rate 18, height 6\' 1"  (1.854 m), weight 90.7 kg, SpO2 95%.   Intake/Output Summary (Last 24 hours) at 01/23/2023 1050 Last data filed at 01/23/2023 0815 Gross per 24 hour  Intake 502.19 ml  Output 900 ml  Net -397.81 ml    Exam Gen:- Awake Alert, no acute distress , no conversational dyspnea HEENT:- [place].AT, No sclera icterus Nose- [place] 4L/min Neck-Supple Neck,No JVD,.  Lungs-improved air movement, no wheezing CV- S1, S2 normal, regular Abd-  +ve B.Sounds, Abd Soft, No tenderness,    Extremity/Skin:- No  edema,   good pulses Psych-affect is appropriate, oriented x3 Neuro-no new focal deficits, no tremors    Data Review   CBC w Diff:  Lab  Results  Component Value Date   WBC 9.1 01/22/2023   HGB 11.1 (L) 01/22/2023   HCT 38.4 (L) 01/22/2023   PLT 184 01/22/2023   LYMPHOPCT 22 12/01/2021   MONOPCT 7 12/01/2021   EOSPCT 1 12/01/2021   BASOPCT 0 12/01/2021   CMP:  Lab Results  Component Value Date   NA 137 01/22/2023   K 4.1 01/22/2023   CL 91 (L) 01/22/2023   CO2 39 (H) 01/22/2023   BUN 16 01/22/2023   CREATININE 0.54 (L) 01/22/2023   CREATININE 0.91 12/27/2015   PROT 7.1 12/01/2021   ALBUMIN 3.4 (L) 12/01/2021   BILITOT 0.9 12/01/2021   ALKPHOS 75 12/01/2021   AST 17 12/01/2021   ALT 20 12/01/2021  . Total Discharge time is about 33 minutes  Shon Hale M.D on 01/23/2023 at 10:50 AM  Go to [email or web address] -  for contact info  [facility] - Office  [phone]

## 2023-01-23 NOTE — TOC Transition Note (Signed)
Transition of Care [facility]) - CM/SW Discharge Note   Patient Details  Name: David Stein MRN: [id number] Date of Birth: 10/04/1945  Transition of Care [facility] CM/SW Contact:  Leitha Bleak, RN Phone Number: 01/23/2023, 10:10 AM   Clinical Narrative:   Patient discharging home, he is on 4L of home oxygen with [employer]. He uses a Press photographer., very weak when standing. He is agreeable to HHPT and aide, CMS choices given, he has no preferences. Kandee Keen with [facility] accepted for HHPT/Aide, MD placed orders.    Final next level of care: Home w Home Health Services Barriers to Discharge: Barriers Resolved   Patient Goals and CMS Choice CMS Medicare.gov Compare Post Acute Care list provided to:: Patient Choice offered to / list presented to : Patient  Discharge Placement     Patient and family notified of of transfer: 01/23/23  Discharge Plan and Services Additional resources added to the After Visit Summary for       [facility] Arranged: PT, Nurse's Aide HH Agency: [facility] Date [facility] Agency Contacted: 01/23/23 Time HH Agency Contacted: 1010 Representative spoke with at [facility] Agency: Kandee Keen  Social Determinants of Health (SDOH) Interventions SDOH Screenings   Food Insecurity: No Food Insecurity (01/22/2023)  Housing: Low Risk  (01/22/2023)  Transportation Needs: No Transportation Needs (01/22/2023)  Utilities: Not At Risk (01/22/2023)  Tobacco Use: Medium Risk (01/21/2023)    Readmission Risk Interventions    01/23/2023   10:05 AM  Readmission Risk Prevention Plan  Transportation Screening Complete  PCP or Specialist Appt within 5-7 Days Not Complete  Home Care Screening Complete  Medication Review (RN CM) Complete

## 2023-01-26 LAB — CULTURE, BLOOD (ROUTINE X 2)
Culture: NO GROWTH
Culture: NO GROWTH

## 2023-01-27 DIAGNOSIS — J181 Lobar pneumonia, unspecified organism: Secondary | ICD-10-CM | POA: Diagnosis not present

## 2023-01-27 DIAGNOSIS — J8410 Pulmonary fibrosis, unspecified: Secondary | ICD-10-CM | POA: Diagnosis not present

## 2023-01-27 DIAGNOSIS — K219 Gastro-esophageal reflux disease without esophagitis: Secondary | ICD-10-CM | POA: Diagnosis not present

## 2023-01-27 DIAGNOSIS — J440 Chronic obstructive pulmonary disease with acute lower respiratory infection: Secondary | ICD-10-CM | POA: Diagnosis not present

## 2023-01-27 DIAGNOSIS — N200 Calculus of kidney: Secondary | ICD-10-CM | POA: Diagnosis not present

## 2023-01-27 DIAGNOSIS — E039 Hypothyroidism, unspecified: Secondary | ICD-10-CM | POA: Diagnosis not present

## 2023-01-27 DIAGNOSIS — M1990 Unspecified osteoarthritis, unspecified site: Secondary | ICD-10-CM | POA: Diagnosis not present

## 2023-01-27 DIAGNOSIS — J439 Emphysema, unspecified: Secondary | ICD-10-CM | POA: Diagnosis not present

## 2023-01-27 DIAGNOSIS — I700 Atherosclerosis of aorta: Secondary | ICD-10-CM | POA: Diagnosis not present

## 2023-01-27 DIAGNOSIS — J9622 Acute and chronic respiratory failure with hypercapnia: Secondary | ICD-10-CM | POA: Diagnosis not present

## 2023-01-27 DIAGNOSIS — J441 Chronic obstructive pulmonary disease with (acute) exacerbation: Secondary | ICD-10-CM | POA: Diagnosis not present

## 2023-01-27 DIAGNOSIS — I4892 Unspecified atrial flutter: Secondary | ICD-10-CM | POA: Diagnosis not present

## 2023-01-27 DIAGNOSIS — Z9981 Dependence on supplemental oxygen: Secondary | ICD-10-CM | POA: Diagnosis not present

## 2023-01-27 DIAGNOSIS — G2581 Restless legs syndrome: Secondary | ICD-10-CM | POA: Diagnosis not present

## 2023-01-27 DIAGNOSIS — J470 Bronchiectasis with acute lower respiratory infection: Secondary | ICD-10-CM | POA: Diagnosis not present

## 2023-01-27 DIAGNOSIS — J9611 Chronic respiratory failure with hypoxia: Secondary | ICD-10-CM | POA: Diagnosis not present

## 2023-01-30 DIAGNOSIS — J440 Chronic obstructive pulmonary disease with acute lower respiratory infection: Secondary | ICD-10-CM | POA: Diagnosis not present

## 2023-01-30 DIAGNOSIS — J8410 Pulmonary fibrosis, unspecified: Secondary | ICD-10-CM | POA: Diagnosis not present

## 2023-01-30 DIAGNOSIS — J9611 Chronic respiratory failure with hypoxia: Secondary | ICD-10-CM | POA: Diagnosis not present

## 2023-01-30 DIAGNOSIS — G2581 Restless legs syndrome: Secondary | ICD-10-CM | POA: Diagnosis not present

## 2023-01-30 DIAGNOSIS — M1990 Unspecified osteoarthritis, unspecified site: Secondary | ICD-10-CM | POA: Diagnosis not present

## 2023-01-30 DIAGNOSIS — J9622 Acute and chronic respiratory failure with hypercapnia: Secondary | ICD-10-CM | POA: Diagnosis not present

## 2023-01-30 DIAGNOSIS — J181 Lobar pneumonia, unspecified organism: Secondary | ICD-10-CM | POA: Diagnosis not present

## 2023-01-30 DIAGNOSIS — J439 Emphysema, unspecified: Secondary | ICD-10-CM | POA: Diagnosis not present

## 2023-01-30 DIAGNOSIS — J470 Bronchiectasis with acute lower respiratory infection: Secondary | ICD-10-CM | POA: Diagnosis not present

## 2023-01-30 DIAGNOSIS — K219 Gastro-esophageal reflux disease without esophagitis: Secondary | ICD-10-CM | POA: Diagnosis not present

## 2023-01-30 DIAGNOSIS — E039 Hypothyroidism, unspecified: Secondary | ICD-10-CM | POA: Diagnosis not present

## 2023-01-30 DIAGNOSIS — Z9981 Dependence on supplemental oxygen: Secondary | ICD-10-CM | POA: Diagnosis not present

## 2023-01-30 DIAGNOSIS — I700 Atherosclerosis of aorta: Secondary | ICD-10-CM | POA: Diagnosis not present

## 2023-01-30 DIAGNOSIS — N200 Calculus of kidney: Secondary | ICD-10-CM | POA: Diagnosis not present

## 2023-01-30 DIAGNOSIS — J441 Chronic obstructive pulmonary disease with (acute) exacerbation: Secondary | ICD-10-CM | POA: Diagnosis not present

## 2023-01-30 DIAGNOSIS — I4892 Unspecified atrial flutter: Secondary | ICD-10-CM | POA: Diagnosis not present

## 2023-02-02 DIAGNOSIS — G4733 Obstructive sleep apnea (adult) (pediatric): Secondary | ICD-10-CM | POA: Diagnosis not present

## 2023-02-02 DIAGNOSIS — M159 Polyosteoarthritis, unspecified: Secondary | ICD-10-CM | POA: Diagnosis not present

## 2023-02-02 DIAGNOSIS — I700 Atherosclerosis of aorta: Secondary | ICD-10-CM | POA: Diagnosis not present

## 2023-02-02 DIAGNOSIS — A310 Pulmonary mycobacterial infection: Secondary | ICD-10-CM | POA: Diagnosis not present

## 2023-02-02 DIAGNOSIS — J449 Chronic obstructive pulmonary disease, unspecified: Secondary | ICD-10-CM | POA: Diagnosis not present

## 2023-02-02 DIAGNOSIS — I4892 Unspecified atrial flutter: Secondary | ICD-10-CM | POA: Diagnosis not present

## 2023-02-02 DIAGNOSIS — I10 Essential (primary) hypertension: Secondary | ICD-10-CM | POA: Diagnosis not present

## 2023-02-02 DIAGNOSIS — M1991 Primary osteoarthritis, unspecified site: Secondary | ICD-10-CM | POA: Diagnosis not present

## 2023-02-02 DIAGNOSIS — J9610 Chronic respiratory failure, unspecified whether with hypoxia or hypercapnia: Secondary | ICD-10-CM | POA: Diagnosis not present

## 2023-02-02 DIAGNOSIS — I4891 Unspecified atrial fibrillation: Secondary | ICD-10-CM | POA: Diagnosis not present

## 2023-02-03 DIAGNOSIS — J9622 Acute and chronic respiratory failure with hypercapnia: Secondary | ICD-10-CM | POA: Diagnosis not present

## 2023-02-03 DIAGNOSIS — Z9981 Dependence on supplemental oxygen: Secondary | ICD-10-CM | POA: Diagnosis not present

## 2023-02-03 DIAGNOSIS — J8410 Pulmonary fibrosis, unspecified: Secondary | ICD-10-CM | POA: Diagnosis not present

## 2023-02-03 DIAGNOSIS — J9611 Chronic respiratory failure with hypoxia: Secondary | ICD-10-CM | POA: Diagnosis not present

## 2023-02-03 DIAGNOSIS — N200 Calculus of kidney: Secondary | ICD-10-CM | POA: Diagnosis not present

## 2023-02-03 DIAGNOSIS — J440 Chronic obstructive pulmonary disease with acute lower respiratory infection: Secondary | ICD-10-CM | POA: Diagnosis not present

## 2023-02-03 DIAGNOSIS — J441 Chronic obstructive pulmonary disease with (acute) exacerbation: Secondary | ICD-10-CM | POA: Diagnosis not present

## 2023-02-03 DIAGNOSIS — J181 Lobar pneumonia, unspecified organism: Secondary | ICD-10-CM | POA: Diagnosis not present

## 2023-02-03 DIAGNOSIS — J439 Emphysema, unspecified: Secondary | ICD-10-CM | POA: Diagnosis not present

## 2023-02-03 DIAGNOSIS — J470 Bronchiectasis with acute lower respiratory infection: Secondary | ICD-10-CM | POA: Diagnosis not present

## 2023-02-03 DIAGNOSIS — I700 Atherosclerosis of aorta: Secondary | ICD-10-CM | POA: Diagnosis not present

## 2023-02-03 DIAGNOSIS — E039 Hypothyroidism, unspecified: Secondary | ICD-10-CM | POA: Diagnosis not present

## 2023-02-03 DIAGNOSIS — G2581 Restless legs syndrome: Secondary | ICD-10-CM | POA: Diagnosis not present

## 2023-02-03 DIAGNOSIS — M1990 Unspecified osteoarthritis, unspecified site: Secondary | ICD-10-CM | POA: Diagnosis not present

## 2023-02-03 DIAGNOSIS — K219 Gastro-esophageal reflux disease without esophagitis: Secondary | ICD-10-CM | POA: Diagnosis not present

## 2023-02-03 DIAGNOSIS — I4892 Unspecified atrial flutter: Secondary | ICD-10-CM | POA: Diagnosis not present

## 2023-02-04 DIAGNOSIS — J181 Lobar pneumonia, unspecified organism: Secondary | ICD-10-CM | POA: Diagnosis not present

## 2023-02-04 DIAGNOSIS — J441 Chronic obstructive pulmonary disease with (acute) exacerbation: Secondary | ICD-10-CM | POA: Diagnosis not present

## 2023-02-04 DIAGNOSIS — J440 Chronic obstructive pulmonary disease with acute lower respiratory infection: Secondary | ICD-10-CM | POA: Diagnosis not present

## 2023-02-04 DIAGNOSIS — J470 Bronchiectasis with acute lower respiratory infection: Secondary | ICD-10-CM | POA: Diagnosis not present

## 2023-02-05 DIAGNOSIS — J440 Chronic obstructive pulmonary disease with acute lower respiratory infection: Secondary | ICD-10-CM | POA: Diagnosis not present

## 2023-02-05 DIAGNOSIS — J181 Lobar pneumonia, unspecified organism: Secondary | ICD-10-CM | POA: Diagnosis not present

## 2023-02-05 DIAGNOSIS — I700 Atherosclerosis of aorta: Secondary | ICD-10-CM | POA: Diagnosis not present

## 2023-02-05 DIAGNOSIS — M1990 Unspecified osteoarthritis, unspecified site: Secondary | ICD-10-CM | POA: Diagnosis not present

## 2023-02-05 DIAGNOSIS — N200 Calculus of kidney: Secondary | ICD-10-CM | POA: Diagnosis not present

## 2023-02-05 DIAGNOSIS — J441 Chronic obstructive pulmonary disease with (acute) exacerbation: Secondary | ICD-10-CM | POA: Diagnosis not present

## 2023-02-05 DIAGNOSIS — J9622 Acute and chronic respiratory failure with hypercapnia: Secondary | ICD-10-CM | POA: Diagnosis not present

## 2023-02-05 DIAGNOSIS — K219 Gastro-esophageal reflux disease without esophagitis: Secondary | ICD-10-CM | POA: Diagnosis not present

## 2023-02-05 DIAGNOSIS — I4892 Unspecified atrial flutter: Secondary | ICD-10-CM | POA: Diagnosis not present

## 2023-02-05 DIAGNOSIS — Z9981 Dependence on supplemental oxygen: Secondary | ICD-10-CM | POA: Diagnosis not present

## 2023-02-05 DIAGNOSIS — G2581 Restless legs syndrome: Secondary | ICD-10-CM | POA: Diagnosis not present

## 2023-02-05 DIAGNOSIS — J470 Bronchiectasis with acute lower respiratory infection: Secondary | ICD-10-CM | POA: Diagnosis not present

## 2023-02-05 DIAGNOSIS — E039 Hypothyroidism, unspecified: Secondary | ICD-10-CM | POA: Diagnosis not present

## 2023-02-05 DIAGNOSIS — J8410 Pulmonary fibrosis, unspecified: Secondary | ICD-10-CM | POA: Diagnosis not present

## 2023-02-05 DIAGNOSIS — J9611 Chronic respiratory failure with hypoxia: Secondary | ICD-10-CM | POA: Diagnosis not present

## 2023-02-05 DIAGNOSIS — J439 Emphysema, unspecified: Secondary | ICD-10-CM | POA: Diagnosis not present

## 2023-02-10 DIAGNOSIS — K219 Gastro-esophageal reflux disease without esophagitis: Secondary | ICD-10-CM | POA: Diagnosis not present

## 2023-02-10 DIAGNOSIS — J441 Chronic obstructive pulmonary disease with (acute) exacerbation: Secondary | ICD-10-CM | POA: Diagnosis not present

## 2023-02-10 DIAGNOSIS — I4892 Unspecified atrial flutter: Secondary | ICD-10-CM | POA: Diagnosis not present

## 2023-02-10 DIAGNOSIS — I700 Atherosclerosis of aorta: Secondary | ICD-10-CM | POA: Diagnosis not present

## 2023-02-10 DIAGNOSIS — J439 Emphysema, unspecified: Secondary | ICD-10-CM | POA: Diagnosis not present

## 2023-02-10 DIAGNOSIS — J470 Bronchiectasis with acute lower respiratory infection: Secondary | ICD-10-CM | POA: Diagnosis not present

## 2023-02-10 DIAGNOSIS — J9622 Acute and chronic respiratory failure with hypercapnia: Secondary | ICD-10-CM | POA: Diagnosis not present

## 2023-02-10 DIAGNOSIS — J8410 Pulmonary fibrosis, unspecified: Secondary | ICD-10-CM | POA: Diagnosis not present

## 2023-02-10 DIAGNOSIS — M1990 Unspecified osteoarthritis, unspecified site: Secondary | ICD-10-CM | POA: Diagnosis not present

## 2023-02-10 DIAGNOSIS — E039 Hypothyroidism, unspecified: Secondary | ICD-10-CM | POA: Diagnosis not present

## 2023-02-10 DIAGNOSIS — N200 Calculus of kidney: Secondary | ICD-10-CM | POA: Diagnosis not present

## 2023-02-10 DIAGNOSIS — J181 Lobar pneumonia, unspecified organism: Secondary | ICD-10-CM | POA: Diagnosis not present

## 2023-02-10 DIAGNOSIS — J440 Chronic obstructive pulmonary disease with acute lower respiratory infection: Secondary | ICD-10-CM | POA: Diagnosis not present

## 2023-02-10 DIAGNOSIS — Z9981 Dependence on supplemental oxygen: Secondary | ICD-10-CM | POA: Diagnosis not present

## 2023-02-10 DIAGNOSIS — J9611 Chronic respiratory failure with hypoxia: Secondary | ICD-10-CM | POA: Diagnosis not present

## 2023-02-10 DIAGNOSIS — G2581 Restless legs syndrome: Secondary | ICD-10-CM | POA: Diagnosis not present

## 2023-02-13 DIAGNOSIS — N200 Calculus of kidney: Secondary | ICD-10-CM | POA: Diagnosis not present

## 2023-02-13 DIAGNOSIS — G2581 Restless legs syndrome: Secondary | ICD-10-CM | POA: Diagnosis not present

## 2023-02-13 DIAGNOSIS — J470 Bronchiectasis with acute lower respiratory infection: Secondary | ICD-10-CM | POA: Diagnosis not present

## 2023-02-13 DIAGNOSIS — Z9981 Dependence on supplemental oxygen: Secondary | ICD-10-CM | POA: Diagnosis not present

## 2023-02-13 DIAGNOSIS — M1990 Unspecified osteoarthritis, unspecified site: Secondary | ICD-10-CM | POA: Diagnosis not present

## 2023-02-13 DIAGNOSIS — J439 Emphysema, unspecified: Secondary | ICD-10-CM | POA: Diagnosis not present

## 2023-02-13 DIAGNOSIS — J9611 Chronic respiratory failure with hypoxia: Secondary | ICD-10-CM | POA: Diagnosis not present

## 2023-02-13 DIAGNOSIS — J441 Chronic obstructive pulmonary disease with (acute) exacerbation: Secondary | ICD-10-CM | POA: Diagnosis not present

## 2023-02-13 DIAGNOSIS — J9622 Acute and chronic respiratory failure with hypercapnia: Secondary | ICD-10-CM | POA: Diagnosis not present

## 2023-02-13 DIAGNOSIS — I700 Atherosclerosis of aorta: Secondary | ICD-10-CM | POA: Diagnosis not present

## 2023-02-13 DIAGNOSIS — I4892 Unspecified atrial flutter: Secondary | ICD-10-CM | POA: Diagnosis not present

## 2023-02-13 DIAGNOSIS — J181 Lobar pneumonia, unspecified organism: Secondary | ICD-10-CM | POA: Diagnosis not present

## 2023-02-13 DIAGNOSIS — E039 Hypothyroidism, unspecified: Secondary | ICD-10-CM | POA: Diagnosis not present

## 2023-02-13 DIAGNOSIS — K219 Gastro-esophageal reflux disease without esophagitis: Secondary | ICD-10-CM | POA: Diagnosis not present

## 2023-02-13 DIAGNOSIS — J440 Chronic obstructive pulmonary disease with acute lower respiratory infection: Secondary | ICD-10-CM | POA: Diagnosis not present

## 2023-02-13 DIAGNOSIS — J8410 Pulmonary fibrosis, unspecified: Secondary | ICD-10-CM | POA: Diagnosis not present

## 2023-02-16 DIAGNOSIS — J441 Chronic obstructive pulmonary disease with (acute) exacerbation: Secondary | ICD-10-CM | POA: Diagnosis not present

## 2023-02-16 DIAGNOSIS — M1990 Unspecified osteoarthritis, unspecified site: Secondary | ICD-10-CM | POA: Diagnosis not present

## 2023-02-16 DIAGNOSIS — J470 Bronchiectasis with acute lower respiratory infection: Secondary | ICD-10-CM | POA: Diagnosis not present

## 2023-02-16 DIAGNOSIS — I4892 Unspecified atrial flutter: Secondary | ICD-10-CM | POA: Diagnosis not present

## 2023-02-16 DIAGNOSIS — J9611 Chronic respiratory failure with hypoxia: Secondary | ICD-10-CM | POA: Diagnosis not present

## 2023-02-16 DIAGNOSIS — I700 Atherosclerosis of aorta: Secondary | ICD-10-CM | POA: Diagnosis not present

## 2023-02-16 DIAGNOSIS — E039 Hypothyroidism, unspecified: Secondary | ICD-10-CM | POA: Diagnosis not present

## 2023-02-16 DIAGNOSIS — K219 Gastro-esophageal reflux disease without esophagitis: Secondary | ICD-10-CM | POA: Diagnosis not present

## 2023-02-16 DIAGNOSIS — G2581 Restless legs syndrome: Secondary | ICD-10-CM | POA: Diagnosis not present

## 2023-02-16 DIAGNOSIS — J440 Chronic obstructive pulmonary disease with acute lower respiratory infection: Secondary | ICD-10-CM | POA: Diagnosis not present

## 2023-02-16 DIAGNOSIS — J8410 Pulmonary fibrosis, unspecified: Secondary | ICD-10-CM | POA: Diagnosis not present

## 2023-02-16 DIAGNOSIS — J439 Emphysema, unspecified: Secondary | ICD-10-CM | POA: Diagnosis not present

## 2023-02-16 DIAGNOSIS — N200 Calculus of kidney: Secondary | ICD-10-CM | POA: Diagnosis not present

## 2023-02-16 DIAGNOSIS — J9622 Acute and chronic respiratory failure with hypercapnia: Secondary | ICD-10-CM | POA: Diagnosis not present

## 2023-02-16 DIAGNOSIS — J181 Lobar pneumonia, unspecified organism: Secondary | ICD-10-CM | POA: Diagnosis not present

## 2023-02-16 DIAGNOSIS — Z9981 Dependence on supplemental oxygen: Secondary | ICD-10-CM | POA: Diagnosis not present

## 2023-02-18 DIAGNOSIS — K080 Exfoliation of teeth due to systemic causes: Secondary | ICD-10-CM | POA: Diagnosis not present

## 2023-02-21 DIAGNOSIS — J449 Chronic obstructive pulmonary disease, unspecified: Secondary | ICD-10-CM | POA: Diagnosis not present

## 2023-02-21 DIAGNOSIS — D6869 Other thrombophilia: Secondary | ICD-10-CM | POA: Diagnosis not present

## 2023-02-21 DIAGNOSIS — J439 Emphysema, unspecified: Secondary | ICD-10-CM | POA: Diagnosis not present

## 2023-02-21 DIAGNOSIS — J441 Chronic obstructive pulmonary disease with (acute) exacerbation: Secondary | ICD-10-CM | POA: Diagnosis not present

## 2023-02-21 DIAGNOSIS — I700 Atherosclerosis of aorta: Secondary | ICD-10-CM | POA: Diagnosis not present

## 2023-02-21 DIAGNOSIS — J181 Lobar pneumonia, unspecified organism: Secondary | ICD-10-CM | POA: Diagnosis not present

## 2023-02-21 DIAGNOSIS — Z9981 Dependence on supplemental oxygen: Secondary | ICD-10-CM | POA: Diagnosis not present

## 2023-02-21 DIAGNOSIS — K219 Gastro-esophageal reflux disease without esophagitis: Secondary | ICD-10-CM | POA: Diagnosis not present

## 2023-02-21 DIAGNOSIS — I4892 Unspecified atrial flutter: Secondary | ICD-10-CM | POA: Diagnosis not present

## 2023-02-21 DIAGNOSIS — J9622 Acute and chronic respiratory failure with hypercapnia: Secondary | ICD-10-CM | POA: Diagnosis not present

## 2023-02-21 DIAGNOSIS — I4891 Unspecified atrial fibrillation: Secondary | ICD-10-CM | POA: Diagnosis not present

## 2023-02-21 DIAGNOSIS — J9610 Chronic respiratory failure, unspecified whether with hypoxia or hypercapnia: Secondary | ICD-10-CM | POA: Diagnosis not present

## 2023-02-21 DIAGNOSIS — J470 Bronchiectasis with acute lower respiratory infection: Secondary | ICD-10-CM | POA: Diagnosis not present

## 2023-02-21 DIAGNOSIS — J8410 Pulmonary fibrosis, unspecified: Secondary | ICD-10-CM | POA: Diagnosis not present

## 2023-02-21 DIAGNOSIS — J440 Chronic obstructive pulmonary disease with acute lower respiratory infection: Secondary | ICD-10-CM | POA: Diagnosis not present

## 2023-02-21 DIAGNOSIS — J9611 Chronic respiratory failure with hypoxia: Secondary | ICD-10-CM | POA: Diagnosis not present

## 2023-02-21 DIAGNOSIS — E039 Hypothyroidism, unspecified: Secondary | ICD-10-CM | POA: Diagnosis not present

## 2023-02-21 DIAGNOSIS — M1990 Unspecified osteoarthritis, unspecified site: Secondary | ICD-10-CM | POA: Diagnosis not present

## 2023-02-21 DIAGNOSIS — G2581 Restless legs syndrome: Secondary | ICD-10-CM | POA: Diagnosis not present

## 2023-02-21 DIAGNOSIS — N200 Calculus of kidney: Secondary | ICD-10-CM | POA: Diagnosis not present

## 2023-02-23 DIAGNOSIS — J9611 Chronic respiratory failure with hypoxia: Secondary | ICD-10-CM | POA: Diagnosis not present

## 2023-02-23 DIAGNOSIS — E039 Hypothyroidism, unspecified: Secondary | ICD-10-CM | POA: Diagnosis not present

## 2023-02-23 DIAGNOSIS — J8410 Pulmonary fibrosis, unspecified: Secondary | ICD-10-CM | POA: Diagnosis not present

## 2023-02-23 DIAGNOSIS — J441 Chronic obstructive pulmonary disease with (acute) exacerbation: Secondary | ICD-10-CM | POA: Diagnosis not present

## 2023-02-23 DIAGNOSIS — G2581 Restless legs syndrome: Secondary | ICD-10-CM | POA: Diagnosis not present

## 2023-02-23 DIAGNOSIS — I4892 Unspecified atrial flutter: Secondary | ICD-10-CM | POA: Diagnosis not present

## 2023-02-23 DIAGNOSIS — N200 Calculus of kidney: Secondary | ICD-10-CM | POA: Diagnosis not present

## 2023-02-23 DIAGNOSIS — J9622 Acute and chronic respiratory failure with hypercapnia: Secondary | ICD-10-CM | POA: Diagnosis not present

## 2023-02-23 DIAGNOSIS — Z9981 Dependence on supplemental oxygen: Secondary | ICD-10-CM | POA: Diagnosis not present

## 2023-02-23 DIAGNOSIS — J470 Bronchiectasis with acute lower respiratory infection: Secondary | ICD-10-CM | POA: Diagnosis not present

## 2023-02-23 DIAGNOSIS — J181 Lobar pneumonia, unspecified organism: Secondary | ICD-10-CM | POA: Diagnosis not present

## 2023-02-23 DIAGNOSIS — J440 Chronic obstructive pulmonary disease with acute lower respiratory infection: Secondary | ICD-10-CM | POA: Diagnosis not present

## 2023-02-23 DIAGNOSIS — J439 Emphysema, unspecified: Secondary | ICD-10-CM | POA: Diagnosis not present

## 2023-02-23 DIAGNOSIS — K219 Gastro-esophageal reflux disease without esophagitis: Secondary | ICD-10-CM | POA: Diagnosis not present

## 2023-02-23 DIAGNOSIS — I700 Atherosclerosis of aorta: Secondary | ICD-10-CM | POA: Diagnosis not present

## 2023-02-23 DIAGNOSIS — M1990 Unspecified osteoarthritis, unspecified site: Secondary | ICD-10-CM | POA: Diagnosis not present

## 2023-02-24 DIAGNOSIS — J9610 Chronic respiratory failure, unspecified whether with hypoxia or hypercapnia: Secondary | ICD-10-CM | POA: Diagnosis not present

## 2023-02-24 DIAGNOSIS — J449 Chronic obstructive pulmonary disease, unspecified: Secondary | ICD-10-CM | POA: Diagnosis not present

## 2023-02-24 DIAGNOSIS — F4312 Post-traumatic stress disorder, chronic: Secondary | ICD-10-CM | POA: Diagnosis not present

## 2023-02-24 DIAGNOSIS — I4891 Unspecified atrial fibrillation: Secondary | ICD-10-CM | POA: Diagnosis not present

## 2023-02-25 ENCOUNTER — Other Ambulatory Visit: Payer: Self-pay | Admitting: Cardiology

## 2023-03-05 DIAGNOSIS — K219 Gastro-esophageal reflux disease without esophagitis: Secondary | ICD-10-CM | POA: Diagnosis not present

## 2023-03-05 DIAGNOSIS — J440 Chronic obstructive pulmonary disease with acute lower respiratory infection: Secondary | ICD-10-CM | POA: Diagnosis not present

## 2023-03-05 DIAGNOSIS — J181 Lobar pneumonia, unspecified organism: Secondary | ICD-10-CM | POA: Diagnosis not present

## 2023-03-05 DIAGNOSIS — J9611 Chronic respiratory failure with hypoxia: Secondary | ICD-10-CM | POA: Diagnosis not present

## 2023-03-05 DIAGNOSIS — I4892 Unspecified atrial flutter: Secondary | ICD-10-CM | POA: Diagnosis not present

## 2023-03-05 DIAGNOSIS — G2581 Restless legs syndrome: Secondary | ICD-10-CM | POA: Diagnosis not present

## 2023-03-05 DIAGNOSIS — M1990 Unspecified osteoarthritis, unspecified site: Secondary | ICD-10-CM | POA: Diagnosis not present

## 2023-03-05 DIAGNOSIS — J441 Chronic obstructive pulmonary disease with (acute) exacerbation: Secondary | ICD-10-CM | POA: Diagnosis not present

## 2023-03-05 DIAGNOSIS — E039 Hypothyroidism, unspecified: Secondary | ICD-10-CM | POA: Diagnosis not present

## 2023-03-05 DIAGNOSIS — N200 Calculus of kidney: Secondary | ICD-10-CM | POA: Diagnosis not present

## 2023-03-05 DIAGNOSIS — J439 Emphysema, unspecified: Secondary | ICD-10-CM | POA: Diagnosis not present

## 2023-03-05 DIAGNOSIS — Z9981 Dependence on supplemental oxygen: Secondary | ICD-10-CM | POA: Diagnosis not present

## 2023-03-05 DIAGNOSIS — J470 Bronchiectasis with acute lower respiratory infection: Secondary | ICD-10-CM | POA: Diagnosis not present

## 2023-03-05 DIAGNOSIS — J9622 Acute and chronic respiratory failure with hypercapnia: Secondary | ICD-10-CM | POA: Diagnosis not present

## 2023-03-05 DIAGNOSIS — I700 Atherosclerosis of aorta: Secondary | ICD-10-CM | POA: Diagnosis not present

## 2023-03-05 DIAGNOSIS — J8410 Pulmonary fibrosis, unspecified: Secondary | ICD-10-CM | POA: Diagnosis not present

## 2023-03-06 DIAGNOSIS — I4891 Unspecified atrial fibrillation: Secondary | ICD-10-CM | POA: Diagnosis not present

## 2023-03-06 DIAGNOSIS — J470 Bronchiectasis with acute lower respiratory infection: Secondary | ICD-10-CM | POA: Diagnosis not present

## 2023-03-06 DIAGNOSIS — J449 Chronic obstructive pulmonary disease, unspecified: Secondary | ICD-10-CM | POA: Diagnosis not present

## 2023-03-06 DIAGNOSIS — I10 Essential (primary) hypertension: Secondary | ICD-10-CM | POA: Diagnosis not present

## 2023-03-09 DIAGNOSIS — I4892 Unspecified atrial flutter: Secondary | ICD-10-CM | POA: Diagnosis not present

## 2023-03-09 DIAGNOSIS — G2581 Restless legs syndrome: Secondary | ICD-10-CM | POA: Diagnosis not present

## 2023-03-09 DIAGNOSIS — J439 Emphysema, unspecified: Secondary | ICD-10-CM | POA: Diagnosis not present

## 2023-03-09 DIAGNOSIS — M1990 Unspecified osteoarthritis, unspecified site: Secondary | ICD-10-CM | POA: Diagnosis not present

## 2023-03-09 DIAGNOSIS — N200 Calculus of kidney: Secondary | ICD-10-CM | POA: Diagnosis not present

## 2023-03-09 DIAGNOSIS — J440 Chronic obstructive pulmonary disease with acute lower respiratory infection: Secondary | ICD-10-CM | POA: Diagnosis not present

## 2023-03-09 DIAGNOSIS — E039 Hypothyroidism, unspecified: Secondary | ICD-10-CM | POA: Diagnosis not present

## 2023-03-09 DIAGNOSIS — J8410 Pulmonary fibrosis, unspecified: Secondary | ICD-10-CM | POA: Diagnosis not present

## 2023-03-09 DIAGNOSIS — J470 Bronchiectasis with acute lower respiratory infection: Secondary | ICD-10-CM | POA: Diagnosis not present

## 2023-03-09 DIAGNOSIS — J181 Lobar pneumonia, unspecified organism: Secondary | ICD-10-CM | POA: Diagnosis not present

## 2023-03-09 DIAGNOSIS — J9622 Acute and chronic respiratory failure with hypercapnia: Secondary | ICD-10-CM | POA: Diagnosis not present

## 2023-03-09 DIAGNOSIS — J441 Chronic obstructive pulmonary disease with (acute) exacerbation: Secondary | ICD-10-CM | POA: Diagnosis not present

## 2023-03-09 DIAGNOSIS — Z9981 Dependence on supplemental oxygen: Secondary | ICD-10-CM | POA: Diagnosis not present

## 2023-03-09 DIAGNOSIS — I700 Atherosclerosis of aorta: Secondary | ICD-10-CM | POA: Diagnosis not present

## 2023-03-09 DIAGNOSIS — J9611 Chronic respiratory failure with hypoxia: Secondary | ICD-10-CM | POA: Diagnosis not present

## 2023-03-09 DIAGNOSIS — K219 Gastro-esophageal reflux disease without esophagitis: Secondary | ICD-10-CM | POA: Diagnosis not present

## 2023-03-12 ENCOUNTER — Ambulatory Visit: Payer: Medicare Other | Attending: Cardiology | Admitting: Cardiology

## 2023-03-12 ENCOUNTER — Encounter: Payer: Self-pay | Admitting: Cardiology

## 2023-03-12 ENCOUNTER — Other Ambulatory Visit (HOSPITAL_COMMUNITY)
Admission: RE | Admit: 2023-03-12 | Discharge: 2023-03-12 | Disposition: A | Discharge status: Home / Self Care | Payer: Medicare Other | Source: Ambulatory Visit | Attending: Cardiology | Admitting: Cardiology

## 2023-03-12 VITALS — BP 130/60 | HR 74 | Ht 74.0 in | Wt 185.4 lb

## 2023-03-12 DIAGNOSIS — J9611 Chronic respiratory failure with hypoxia: Secondary | ICD-10-CM

## 2023-03-12 DIAGNOSIS — E782 Mixed hyperlipidemia: Secondary | ICD-10-CM

## 2023-03-12 DIAGNOSIS — J449 Chronic obstructive pulmonary disease, unspecified: Secondary | ICD-10-CM | POA: Diagnosis not present

## 2023-03-12 DIAGNOSIS — R319 Hematuria, unspecified: Secondary | ICD-10-CM

## 2023-03-12 DIAGNOSIS — Z79899 Other long term (current) drug therapy: Secondary | ICD-10-CM

## 2023-03-12 DIAGNOSIS — I4892 Unspecified atrial flutter: Secondary | ICD-10-CM | POA: Diagnosis not present

## 2023-03-12 DIAGNOSIS — I5032 Chronic diastolic (congestive) heart failure: Secondary | ICD-10-CM

## 2023-03-12 DIAGNOSIS — J9612 Chronic respiratory failure with hypercapnia: Secondary | ICD-10-CM

## 2023-03-12 LAB — BASIC METABOLIC PANEL
Anion gap: 7 (ref 5–15)
BUN: 18 mg/dL (ref 8–23)
CO2: 43 mmol/L — ABNORMAL HIGH (ref 22–32)
Calcium: 8.7 mg/dL — ABNORMAL LOW (ref 8.9–10.3)
Chloride: 87 mmol/L — ABNORMAL LOW (ref 98–111)
Creatinine, Ser: 0.77 mg/dL (ref 0.61–1.24)
GFR, Estimated: >60 mL/min (ref >60)
Glucose, Bld: 126 mg/dL — ABNORMAL HIGH (ref 70–99)
Potassium: 3.5 mmol/L (ref 3.5–5.1)
Sodium: 137 mmol/L (ref 135–145)

## 2023-03-12 LAB — CBC
HCT: 36.8 % — ABNORMAL LOW (ref 39.0–52.0)
Hemoglobin: 10.9 g/dL — ABNORMAL LOW (ref 13.0–17.0)
MCH: 29.9 pg (ref 26.0–34.0)
MCHC: 29.6 g/dL — ABNORMAL LOW (ref 30.0–36.0)
MCV: 101.1 fL — ABNORMAL HIGH (ref 80.0–100.0)
Platelets: 256 10*3/uL (ref 150–400)
RBC: 3.64 MIL/uL — ABNORMAL LOW (ref 4.22–5.81)
RDW: 14.1 % (ref 11.5–15.5)
WBC: 15.1 10*3/uL — ABNORMAL HIGH (ref 4.0–10.5)
nRBC: 0 % (ref 0.0–0.2)

## 2023-03-12 NOTE — Progress Notes (Signed)
Kidney function remains stable. Blood counts remain stable at 10.9 with previous result of 11.1. Continue current medication regimen without changes needed.

## 2023-03-12 NOTE — Patient Instructions (Signed)
Medication Instructions:  Your physician recommends that you continue on your current medications as directed. Please refer to the Current Medication list given to you today.  *If you need a refill on your cardiac medications before your next appointment, please call your pharmacy*   Lab Work: Your physician recommends that you return for lab work in: today at [facility]   If you have labs (blood work) drawn today and your tests are completely normal, you will receive your results only by: MyChart Message (if you have MyChart) OR A paper copy in the mail If you have any lab test that is abnormal or we need to change your treatment, we will call you to review the results.   Testing/Procedures: NONE    Follow-Up: At [facility], you and your health needs are our priority.  As part of our continuing mission to provide you with exceptional heart care, we have created designated Provider Care Teams.  These Care Teams include your primary Cardiologist (physician) and Advanced Practice Providers (APPs -  Physician Assistants and Nurse Practitioners) who all work together to provide you with the care you need, when you need it.  We recommend signing up for the patient portal called "MyChart".  Sign up information is provided on this After Visit Summary.  MyChart is used to connect with patients for Virtual Visits (Telemedicine).  Patients are able to view lab/test results, encounter notes, upcoming appointments, etc.  Non-urgent messages can be sent to your provider as well.   To learn more about what you can do with MyChart, go to [email or web address].    Your next appointment:   3 month(s)  Provider:   You may see Dina Rich, MD or one of the following Advanced Practice Providers on your designated Care Team:   Randall An, PA-C  Jacolyn Reedy, [place]     Other Instructions Thank you for choosing [facility]!

## 2023-03-12 NOTE — Progress Notes (Signed)
Cardiology Office Note:  .   Date:  03/12/2023  ID:  Ivor Reining, DOB 01/27/1946, MRN [id number] PCP: Elfredia Nevins, MD  [facility] Providers Cardiologist:  Dina Rich, MD Electrophysiologist:  Lewayne Bunting, MD    History of Present Illness: .   David Stein is a 77 y.o. male with a past medical history of atrial flutter, COPD, chronic respiratory failure with hypoxia, chronic HFpEF, hypothyroidism, arthritis, restless leg syndrome, hyperlipidemia, peripheral edema, who is here today for follow-up after recent hospital discharge.   Previously been placed on amiodarone for the treatment of atrial flutter initially during admission to the hospital in 2017 where he had pneumonia where his atrial flutter was difficult to control.  Amiodarone had then been stopped due to no reoccurrence and he was doing well.  He was then admitted in 06/2020 with palpitations found to be in atrial flutter.  DCCV was not pursued and he was restarted back on oral amiodarone during the admission.  He continues to follow with the EP in outpatient clinic.  Echocardiogram completed in 06/2020 revealed LVEF 55 to 60%, G1 DD, and severe RV dysfunction.  He also was maintained follow-up with pulmonary for COPD and is on home O2 of 3 L.  Was last seen in clinic by cardiology in 05/2021.  Since that time he has been admitted to the hospital on 2 separate occasions and had 4 additional emergency department visits.   He was admitted to [facility] on 01/21/2023 for gross hematuria, COPD exacerbation, and acute respiratory failure with hypercapnia.  He presented to the emergency department difficulty breathing productive cough and generalized weakness.  He reported that over the past 2 days he had had increasing difficulty breathing.  There was no chest pain.  He did have chronic lower extremity swelling that is unchanged and actually improved from his baseline.  He states he is compliant with his medication of  apixaban and furosemide daily.  There was no change in his mental status.  He reports that once per week he has had blood in his urine which she continued to follow with urology on an outpatient status.  COPD exacerbation and improved with steroids and antibiotics.  Hematuria was stable and hemoglobin remained around 11 on his current dosing of apixaban.  He was considered stable for discharge and was discharged on 01/23/2023.  He returns to clinic today accompanied by his daughter.  From a cardiac perspective he states that he occasionally has some increased swelling to his bilateral lower extremities, has chronic shortness of breath on chronic oxygen therapy, but denies chest pain or palpitations.  Continues to have occasional hematuria that is followed with urology which I think is coming from his prostate and states that several medication changes made to cause vasoconstriction of the prostate.  Denies any blood noted in his stool has been continued on apixaban with no missed doses.  States that he has been compliant with his medication.  He also states that he needs an updated chest CT after they have previously found a 12 mm pulmonary nodule on chest CT that was done during his last hospitalization.  He recently finished doxycycline for upper respiratory infection and states he feels as though he needs a stronger antibiotic therapy today which he will follow-up with his PCP for additional antibiotic therapy.  Denies any recent hospitalizations since his discharge at the beginning of November and has had no additional emergency department visits.  ROS: 10 point review of  systems has been reviewed and considered negative with exception was been listed in the HPI  Studies Reviewed: Marland Kitchen       TTE 07/13/20 1. Left ventricular ejection fraction, by estimation, is approximately 55  to 60%. The left ventricle has normal function. Left ventricular  endocardial border not optimally defined to evaluate regional  wall motion.  There is mild left ventricular  hypertrophy. Left ventricular diastolic parameters are consistent with  Grade I diastolic dysfunction (impaired relaxation).   2. Right ventricular systolic function is severely reduced. The right  ventricular size is moderately enlarged. Tricuspid regurgitation signal is  inadequate for assessing PA pressure.   3. The mitral valve is grossly normal. Trivial mitral valve  regurgitation.   4. The aortic valve is tricuspid. Aortic valve regurgitation is not  visualized.   5. The inferior vena cava is dilated in size with >50% respiratory  variability, suggesting right atrial pressure of 8 mmHg.   TTE 12/09/2015 Study Conclusions   - Left ventricle: The cavity size was normal. Wall thickness was    normal. Systolic function was normal. The estimated ejection    fraction was in the range of 55% to 60%. Although no diagnostic    regional wall motion abnormality was identified, this possibility    cannot be completely excluded on the basis of this study.  - Ventricular septum: Septal motion showed abnormal function,    dyssynergy, and paradox. The contour showed systolic flattening.    These changes are consistent with RV pressure overload.  Risk Assessment/Calculations:    CHA2DS2-VASc Score = 3   This indicates a 3.2% annual risk of stroke. The patient's score is based upon: CHF History: 1 HTN History: 0 Diabetes History: 0 Stroke History: 0 Vascular Disease History: 0 Age Score: 2 Gender Score: 0            Physical Exam:   VS:  BP 130/60   Pulse 74   Ht 6\' 2"  (1.88 m)   Wt 185 lb 6.4 oz (84.1 kg)   SpO2 95% Comment: 4 LPM  BMI 23.80 kg/m    Wt Readings from Last 3 Encounters:  03/12/23 185 lb 6.4 oz (84.1 kg)  01/21/23 200 lb (90.7 kg)  07/01/22 205 lb (93 kg)    GEN: Chronically ill appearing NECK: No JVD; No carotid bruits CARDIAC: RRR, no murmurs, rubs, gallops RESPIRATORY:  Coarse to auscultation respirations are  unlabored at rest on 4L O2 via [place] ABDOMEN: Soft, non-tender, non-distended EXTREMITIES: Pretibial edema; knee-high compression stockings on bilaterally no deformity   ASSESSMENT AND PLAN: .   Atrial flutter with regular sounding heart sounds on exam today.  He had previously been maintaining sinus rhythm on daily dosed amiodarone. he is continued on apixaban 5 mg twice daily for CHA2DS2-VASc score of at least 3 for stroke prophylaxis.  He is also continued on amiodarone 200 mg daily and diltiazem 240 mg daily.  Patient states that he has been compliant with his current medication and has not noticed any adverse side effects.  COPD with chronic hypoxic respiratory failure on chronic oxygen therapy with recent upper respiratory infection.  Patient states he just finished a round of doxycycline called in by Dr. Sherwood Gambler is requesting repeat antibiotic therapy today.  He has continued on oxygen therapy and for further antibiotics and he has been advised to call his PCPs office again.  He also has upcoming follow-up with his pulmonologist since his hospitalization in October there was a pulmonary nodule  that was found and he will need a follow-up or repeat chest CT in 3 months following his original scan.  Chronic HFpEF with RV systolic heart failure and peripheral edema has been ongoing for several years.  His last echocardiogram was completed in 06/2020 with an LVEF of 55 to 60%, G1 DD, and severe RV dysfunction.  He appears to be euvolemic on exam today is continued on furosemide 20 mg and was advised for increased weight gain, shortness of breath, or worsening swelling he is able to take an additional dose of his furosemide.  Currently not on beta-blocker therapy due to severe COPD.  He is also not a candidate for SGLT2 inhibitors due to recurrent UTIs and hematuria.  He has been sent for follow-up labs today of a BMP and a CBC.  Hyperlipidemia where he is continued on rosuvastatin 10 mg daily.  Will need  updated lipid panel ordered on return.  Hematuria required hospitalization in October.  He has followed up with urology.  Is on tamsulosin and finasteride for prostate vasoconstriction to help decrease bleeding.  He has been sent for an updated CBC today.       Dispo: Patient is scheduled for 3 months return with primary cardiologist Dr. Wyline Mood on a telehealth visit in 3 months per the patient's request  Signed, Isley Weisheit, NP

## 2023-03-16 DIAGNOSIS — J181 Lobar pneumonia, unspecified organism: Secondary | ICD-10-CM | POA: Diagnosis not present

## 2023-03-16 DIAGNOSIS — J9611 Chronic respiratory failure with hypoxia: Secondary | ICD-10-CM | POA: Diagnosis not present

## 2023-03-16 DIAGNOSIS — J439 Emphysema, unspecified: Secondary | ICD-10-CM | POA: Diagnosis not present

## 2023-03-16 DIAGNOSIS — Z9981 Dependence on supplemental oxygen: Secondary | ICD-10-CM | POA: Diagnosis not present

## 2023-03-16 DIAGNOSIS — J441 Chronic obstructive pulmonary disease with (acute) exacerbation: Secondary | ICD-10-CM | POA: Diagnosis not present

## 2023-03-16 DIAGNOSIS — J470 Bronchiectasis with acute lower respiratory infection: Secondary | ICD-10-CM | POA: Diagnosis not present

## 2023-03-16 DIAGNOSIS — E039 Hypothyroidism, unspecified: Secondary | ICD-10-CM | POA: Diagnosis not present

## 2023-03-16 DIAGNOSIS — I700 Atherosclerosis of aorta: Secondary | ICD-10-CM | POA: Diagnosis not present

## 2023-03-16 DIAGNOSIS — M1990 Unspecified osteoarthritis, unspecified site: Secondary | ICD-10-CM | POA: Diagnosis not present

## 2023-03-16 DIAGNOSIS — G2581 Restless legs syndrome: Secondary | ICD-10-CM | POA: Diagnosis not present

## 2023-03-16 DIAGNOSIS — N200 Calculus of kidney: Secondary | ICD-10-CM | POA: Diagnosis not present

## 2023-03-16 DIAGNOSIS — J8410 Pulmonary fibrosis, unspecified: Secondary | ICD-10-CM | POA: Diagnosis not present

## 2023-03-16 DIAGNOSIS — I4892 Unspecified atrial flutter: Secondary | ICD-10-CM | POA: Diagnosis not present

## 2023-03-16 DIAGNOSIS — J9622 Acute and chronic respiratory failure with hypercapnia: Secondary | ICD-10-CM | POA: Diagnosis not present

## 2023-03-16 DIAGNOSIS — K219 Gastro-esophageal reflux disease without esophagitis: Secondary | ICD-10-CM | POA: Diagnosis not present

## 2023-03-16 DIAGNOSIS — J440 Chronic obstructive pulmonary disease with acute lower respiratory infection: Secondary | ICD-10-CM | POA: Diagnosis not present

## 2023-03-21 ENCOUNTER — Other Ambulatory Visit: Payer: Self-pay | Admitting: Cardiology

## 2023-03-24 DIAGNOSIS — I4891 Unspecified atrial fibrillation: Secondary | ICD-10-CM | POA: Diagnosis not present

## 2023-03-24 DIAGNOSIS — J440 Chronic obstructive pulmonary disease with acute lower respiratory infection: Secondary | ICD-10-CM | POA: Diagnosis not present

## 2023-03-24 DIAGNOSIS — D6869 Other thrombophilia: Secondary | ICD-10-CM | POA: Diagnosis not present

## 2023-03-25 DIAGNOSIS — J449 Chronic obstructive pulmonary disease, unspecified: Secondary | ICD-10-CM | POA: Diagnosis not present

## 2023-03-25 DIAGNOSIS — J439 Emphysema, unspecified: Secondary | ICD-10-CM | POA: Diagnosis not present

## 2023-04-22 DIAGNOSIS — I4891 Unspecified atrial fibrillation: Secondary | ICD-10-CM | POA: Diagnosis not present

## 2023-04-22 DIAGNOSIS — J449 Chronic obstructive pulmonary disease, unspecified: Secondary | ICD-10-CM | POA: Diagnosis not present

## 2023-04-22 DIAGNOSIS — F4312 Post-traumatic stress disorder, chronic: Secondary | ICD-10-CM | POA: Diagnosis not present

## 2023-04-22 DIAGNOSIS — J9610 Chronic respiratory failure, unspecified whether with hypoxia or hypercapnia: Secondary | ICD-10-CM | POA: Diagnosis not present

## 2023-04-24 DIAGNOSIS — J440 Chronic obstructive pulmonary disease with acute lower respiratory infection: Secondary | ICD-10-CM | POA: Diagnosis not present

## 2023-04-24 DIAGNOSIS — J470 Bronchiectasis with acute lower respiratory infection: Secondary | ICD-10-CM | POA: Diagnosis not present

## 2023-04-25 DIAGNOSIS — J449 Chronic obstructive pulmonary disease, unspecified: Secondary | ICD-10-CM | POA: Diagnosis not present

## 2023-04-25 DIAGNOSIS — J439 Emphysema, unspecified: Secondary | ICD-10-CM | POA: Diagnosis not present

## 2023-05-13 ENCOUNTER — Institutional Professional Consult (permissible substitution): Payer: Medicare Other | Admitting: Primary Care

## 2023-05-23 DIAGNOSIS — J439 Emphysema, unspecified: Secondary | ICD-10-CM | POA: Diagnosis not present

## 2023-05-23 DIAGNOSIS — J449 Chronic obstructive pulmonary disease, unspecified: Secondary | ICD-10-CM | POA: Diagnosis not present

## 2023-05-28 ENCOUNTER — Ambulatory Visit (HOSPITAL_COMMUNITY)
Admission: RE | Admit: 2023-05-28 | Discharge: 2023-05-28 | Disposition: A | Discharge status: Home / Self Care | Source: Ambulatory Visit | Attending: Urology | Admitting: Urology

## 2023-05-28 DIAGNOSIS — J479 Bronchiectasis, uncomplicated: Secondary | ICD-10-CM | POA: Diagnosis not present

## 2023-05-28 DIAGNOSIS — N281 Cyst of kidney, acquired: Secondary | ICD-10-CM | POA: Diagnosis not present

## 2023-05-28 DIAGNOSIS — J9612 Chronic respiratory failure with hypercapnia: Secondary | ICD-10-CM | POA: Diagnosis not present

## 2023-05-28 DIAGNOSIS — J449 Chronic obstructive pulmonary disease, unspecified: Secondary | ICD-10-CM | POA: Diagnosis not present

## 2023-05-28 DIAGNOSIS — N200 Calculus of kidney: Secondary | ICD-10-CM | POA: Insufficient documentation

## 2023-06-01 ENCOUNTER — Ambulatory Visit: Payer: Medicare Other | Admitting: Urology

## 2023-06-01 VITALS — BP 114/66 | HR 87

## 2023-06-01 DIAGNOSIS — N200 Calculus of kidney: Secondary | ICD-10-CM | POA: Diagnosis not present

## 2023-06-01 DIAGNOSIS — R339 Retention of urine, unspecified: Secondary | ICD-10-CM

## 2023-06-01 DIAGNOSIS — R310 Gross hematuria: Secondary | ICD-10-CM | POA: Diagnosis not present

## 2023-06-01 MED ORDER — FINASTERIDE 5 MG PO TABS: 5.0000 mg | ORAL_TABLET | Freq: Every day | ORAL | 3 refills | Status: DC

## 2023-06-01 MED ORDER — TAMSULOSIN HCL 0.4 MG PO CAPS: 0.4000 mg | ORAL_CAPSULE | Freq: Every day | ORAL | 3 refills | Status: DC

## 2023-06-01 NOTE — Progress Notes (Unsigned)
 06/01/2023 10:32 AM   Rosalia Hammers Elon Jester 05/08/1945 [id number]  Referring provider: Elfredia Nevins, MD [address] [place],  [place] [address]  Gross hematuria   HPI: Mr Chavarin is a 78yo here for followup for nephrolithiasis and gross hematuria. Renal US 05/28/2023 shows stable bilateral renal calculi. He denies nay flank pain. IPSS 19 QOL 3 on flomax 0.4mg  daily and finasteride 5mg  daily. He has intermittent painless gross hematuria and prior to the hematuria he will have  cloudy urine. He denies any dysuria.    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 Nile Riggs, MD;  Location: [facility];  Service: Ophthalmology;  Laterality: Right;   CATARACT EXTRACTION W/PHACO Left 02/14/2014   Procedure: CATARACT EXTRACTION PHACO AND INTRAOCULAR LENS PLACEMENT; CDE:  8.34;  Surgeon: Loraine Leriche T. Nile Riggs, MD;  Location: [facility];  Service: Ophthalmology;  Laterality: Left;   KNEE SURGERY Left     Home Medications:  Allergies as of 06/01/2023       Reactions   Codeine Other (See Comments)   headache        Medication List        Accurate as of June 01, 2023 10:32 AM. If you have any questions, ask your nurse or doctor.          acetaminophen 500 MG tablet Commonly known as: TYLENOL Take 500 mg by mouth every 6 (six) hours as needed for mild pain.   ALPRAZolam 0.5 MG tablet Commonly known as: XANAX Take 0.5 mg by mouth every 8 (eight) hours as needed for anxiety.   amiodarone 200 MG tablet Commonly known as: PACERONE Take 1 tablet (200 mg total) by mouth daily.    antiseptic oral rinse Liqd 15 mLs by Mouth Rinse route as needed for dry mouth.   apixaban 5 MG Tabs tablet Commonly known as: ELIQUIS Take 1 tablet (5 mg total) by mouth 2 (two) times daily.   Breztri Aerosphere 160-9-4.8 MCG/ACT Aero Generic drug: Budeson-Glycopyrrol-Formoterol Inhale 2 puffs into the lungs in the morning and at bedtime.   carboxymethylcellulose 1 % ophthalmic solution Place 1 drop into both eyes daily as needed (dry eyes).   diltiazem 240 MG 24 hr capsule Commonly known as: CARDIZEM CD Take 1 capsule (240 mg total) by mouth daily.   finasteride 5 MG tablet Commonly known as: PROSCAR Take 1 tablet (5 mg total) by mouth daily.   fluticasone 50 MCG/ACT nasal spray Commonly known as: FLONASE Place 1 spray into both nostrils daily.   furosemide 20 MG tablet Commonly known as: LASIX Take 1 tablet (20 mg total) by mouth daily.   guaiFENesin 600 MG 12 hr tablet Commonly known as: MUCINEX Take 1 tablet (600 mg total) by mouth 2 (two) times daily.   HYDROcodone-acetaminophen 10-325 MG tablet Commonly known as: NORCO Take 1 tablet by mouth 4 (four) times daily as needed for moderate pain (pain score 4-6).   levothyroxine 50 MCG tablet Commonly known as: SYNTHROID Take 50 mcg by mouth daily before breakfast.   M-4 Knee High  Stockings Misc 1 each by Does not apply route as directed.   MAGNESIUM CHLORIDE PO Take 1 tablet by mouth daily.   multivitamin with minerals Tabs tablet Take 1 tablet by mouth daily.   OXYGEN Inhale 4-4.5 L into the lungs continuous.   pantoprazole 40 MG tablet Commonly known as: Protonix Take 1 tablet (40 mg total) by mouth daily.   polyethylene glycol 17 g packet Commonly known as: MIRALAX / GLYCOLAX Take 17 g by mouth daily.   predniSONE 10 MG tablet Commonly known as: DELTASONE Take 10 mg by mouth daily with breakfast.   ProAir HFA 108 (90 Base) MCG/ACT inhaler Generic drug: albuterol Inhale 1 puff into the lungs  every 6 (six) hours as needed for wheezing or shortness of breath.   rosuvastatin 10 MG tablet Commonly known as: CRESTOR Take 10 mg by mouth daily.   senna 8.6 MG tablet Commonly known as: SENOKOT Take 1 tablet by mouth daily.   tamsulosin 0.4 MG Caps capsule Commonly known as: FLOMAX Take 1 capsule (0.4 mg total) by mouth 2 (two) times daily. What changed: when to take this        Allergies:  Allergies  Allergen Reactions   Codeine Other (See Comments)    headache    Family History: Family History  Problem Relation Age of Onset   Rheumatic fever Father    Alzheimer's disease Mother    Stroke Sister     Social History:  reports that he quit smoking about 7 years ago. His smoking use included cigarettes. He started smoking about 62 years ago. He has a 13.8 pack-year smoking history. He has never used smokeless tobacco. He reports that he does not drink alcohol and does not use drugs.  ROS: All other review of systems were reviewed and are negative except what is noted above in HPI  Physical Exam: BP 114/66   Pulse 87   Constitutional:  Alert and oriented, No acute distress. HEENT: [place] AT, moist mucus membranes.  Trachea midline, no masses. Cardiovascular: No clubbing, cyanosis, or edema. Respiratory: Normal respiratory effort, no increased work of breathing. GI: Abdomen is soft, nontender, nondistended, no abdominal masses GU: No CVA tenderness.  Lymph: No cervical or inguinal lymphadenopathy. Skin: No rashes, bruises or suspicious lesions. Neurologic: Grossly intact, no focal deficits, moving all 4 extremities. Psychiatric: Normal mood and affect.  Laboratory Data: Lab Results  Component Value Date   WBC 15.1 (H) 03/12/2023   HGB 10.9 (L) 03/12/2023   HCT 36.8 (L) 03/12/2023   MCV 101.1 (H) 03/12/2023   PLT 256 03/12/2023    Lab Results  Component Value Date   CREATININE 0.77 03/12/2023    No results found for: "PSA"  No results found for:  "TESTOSTERONE"  No results found for: "HGBA1C"  Urinalysis    Component Value Date/Time   COLORURINE AMBER (A) 01/21/2023 1534   APPEARANCEUR CLOUDY (A) 01/21/2023 1534   APPEARANCEUR Cloudy (A) 11/28/2022 1233   LABSPEC 1.024 01/21/2023 1534   PHURINE 6.0 01/21/2023 1534   GLUCOSEU NEGATIVE 01/21/2023 1534   HGBUR LARGE (A) 01/21/2023 1534   BILIRUBINUR NEGATIVE 01/21/2023 1534   BILIRUBINUR Negative 11/28/2022 1233   KETONESUR NEGATIVE 01/21/2023 1534   PROTEINUR 100 (A) 01/21/2023 1534   NITRITE NEGATIVE 01/21/2023 1534   LEUKOCYTESUR NEGATIVE 01/21/2023 1534    Lab Results  Component Value Date   LABMICR See below: 11/28/2022   WBCUA 0-5 11/28/2022   LABEPIT 0-10 11/28/2022   MUCUS Present (A) 11/28/2022  BACTERIA NONE SEEN 01/21/2023    Pertinent Imaging: Renal US 05/28/2023: Images reviewed and discussed with the patient  No results found for this or any previous visit.  No results found for this or any previous visit.  No results found for this or any previous visit.  No results found for this or any previous visit.  Results for orders placed in visit on 10/01/22  US RENAL  Narrative CLINICAL DATA:  Nephrolithiasis.  EXAM: RENAL / URINARY TRACT ULTRASOUND COMPLETE  COMPARISON:  Renal ultrasound 02/12/2022.  CT 01/04/2022  FINDINGS: Right Kidney:  Renal measurements: 12 x 6 x 5.6 cm = volume: 209 mL. Normal parenchymal echogenicity. No hydronephrosis. 2 suspected nonobstructing intrarenal stones measuring approximately 5 mm. Simple cyst measures 3.4 x 3.2 x 3.4 cm in the lower pole. No imaging follow-up is needed. No suspicious renal lesion.  Left Kidney:  Renal measurements: 11.7 x 6.2 x 5.9 cm = volume: 224 mL. No hydronephrosis. Normal parenchymal echogenicity. Nonobstructing 7 mm stone in the upper pole. Additional stones on prior CT are not well seen by ultrasound. No focal renal abnormality.  Bladder:  Partially distended. Appears  normal for degree of bladder distention.  Other:  None.  IMPRESSION: 1. No hydronephrosis.  Bilateral intrarenal calculi. 2. Simple cyst in the right kidney needs no further imaging follow-up.   Electronically Signed By: Narda Rutherford M.D. On: 10/18/2022 14:30  No results found for this or any previous visit.  Results for orders placed in visit on 12/31/21  CT HEMATURIA WORKUP  Narrative CLINICAL DATA:  Gross hematuria.  Flank and abdominal pain.  EXAM: CT ABDOMEN AND PELVIS WITHOUT AND WITH CONTRAST  TECHNIQUE: Multidetector CT imaging of the abdomen and pelvis was performed following the standard protocol before and following the bolus administration of intravenous contrast.  RADIATION DOSE REDUCTION: This exam was performed according to the departmental dose-optimization program which includes automated exposure control, adjustment of the mA and/or kV according to patient size and/or use of iterative reconstruction technique.  CONTRAST:  OMNIPAQUE IOHEXOL 300 MG/ML  SOLN  COMPARISON:  None.  FINDINGS: Lower chest: Mild bronchial wall thickening noted in the lower lungs bilaterally with dependent atelectasis in both lower lobes. Small focus of collapse/consolidative opacity in the lingula is probably atelectasis or scarring.  Hepatobiliary: No suspicious focal abnormality in the liver on this study without intravenous contrast. There is no evidence for gallstones, gallbladder wall thickening, or pericholecystic fluid. No intrahepatic or extrahepatic biliary dilation.  Pancreas: No focal mass lesion. No dilatation of the main duct. No intraparenchymal cyst. No peripancreatic edema.  Spleen: No splenomegaly. No focal mass lesion.  Adrenals/Urinary Tract: No adrenal nodule or mass. 3 cm water density lesion interpolar right kidney is compatible with a cyst. No stones are seen in the right kidney. Mild right hydroureteronephrosis noted secondary to  a 2 mm stone in the right UVJ (66/2). 2 mm nonobstructing stone noted lower pole left kidney with 7 mm nonobstructing stone identified upper pole left kidney. 2 additional punctate stones are seen in the upper interpolar left kidney. No left ureteral stones. No secondary changes in the left kidney or ureter. No evidence for stones free in the bladder lumen.  Stomach/Bowel: Stomach is unremarkable. No gastric wall thickening. No evidence of outlet obstruction. Duodenum is normally positioned as is the ligament of Treitz. No small bowel wall thickening. No small bowel dilatation. The terminal ileum is normal. The appendix is normal. No gross colonic mass. No colonic wall  thickening.  Vascular/Lymphatic: There is moderate atherosclerotic calcification of the abdominal aorta without aneurysm. There is no gastrohepatic or hepatoduodenal ligament lymphadenopathy. No retroperitoneal or mesenteric lymphadenopathy. No pelvic sidewall lymphadenopathy.  Reproductive: The prostate gland and seminal vesicles are unremarkable.  Other: No intraperitoneal free fluid.  Musculoskeletal: No worrisome lytic or sclerotic osseous abnormality.  IMPRESSION: 1. 2 mm right UVJ stone with mild fullness of the right intrarenal collecting system and ureter and subtle right periureteric edema. 2. Nonobstructing left renal stones. 3. Mild bronchial wall thickening in the lower lungs bilaterally with dependent atelectasis in both lower lobes. 4. Small focus of collapse/consolidative opacity in the lingula is probably atelectasis or scarring. 5.  Aortic Atherosclerosis (ICD10-I70.0).   Electronically Signed By: Kennith Center M.D. On: 01/04/2022 15:04  No results found for this or any previous visit.   Assessment & Plan:    1. Nephrolithiasis (Primary) -followup 6 months with renal US - Urinalysis, Routine w reflex microscopic  2. Gross hematuria -continue finasteride 5mg  daily  3. Incomplete  bladder emptying -continue flomax 0.4mg  daily   No follow-ups on file.  Wilkie Aye, MD  [facility] [place]

## 2023-06-02 ENCOUNTER — Encounter: Payer: Self-pay | Admitting: Urology

## 2023-06-02 LAB — URINALYSIS, ROUTINE W REFLEX MICROSCOPIC
Bilirubin, UA: NEGATIVE
Glucose, UA: NEGATIVE
Ketones, UA: NEGATIVE
Leukocytes,UA: NEGATIVE
Nitrite, UA: NEGATIVE
Protein,UA: NEGATIVE
Specific Gravity, UA: 1.015 (ref 1.005–1.030)
Urobilinogen, Ur: 0.2 mg/dL (ref 0.2–1.0)
pH, UA: 6 (ref 5.0–7.5)

## 2023-06-02 LAB — MICROSCOPIC EXAMINATION
Bacteria, UA: NONE SEEN
RBC, Urine: >30 /HPF — AB (ref 0–2)

## 2023-06-02 NOTE — Patient Instructions (Signed)
 Blood in the Pee (Hematuria) in Adults: What to Know  Hematuria is blood in the pee. You may be able to see blood in the pee. In some cases, a health care provider may find blood with a test.  Blood in the pee can be caused by infections of the kidney, bladder, or the urethra. The urethra is the tube that drains pee from the bladder.  Other causes may include: Kidney stones. Infection of the prostate. Cancer. Too much calcium in the pee. Conditions that are passed from parent to child. Too much exercise. Infections can be treated with medicine. A kidney stone will usually leave your body when you pee. If infections or kidney stones didn't cause the blood in the urine, then more tests may be needed. It is very important to tell your provider about any blood in your pee, even if you have no pain or the blood stops with no treatment. Blood in the pee can be a sign of a very serious problem, such as cancer. Follow these instructions at home: Medicines Take your medicines only as told. If you were given antibiotics, take them as told. Do not stop taking them even if you start to feel better. Eating and drinking Drink more fluids as told. Aim to drink 3-4 quarts (2.8-3.8 L) a day. Avoid caffeine, tea, and carbonated drinks. These can bother the bladder. Avoid alcohol if a male because it may irritate the prostate. General instructions If you have been diagnosed with a kidney stone, strain your pee to catch the stone if told by your provider. Empty your bladder often. Avoid holding pee for a long time. If you're male, make sure that: You wipe from front to back after using the bathroom. You use each piece of toilet paper only once. You pee before and after sex. It's up to you to get the results of any tests. Ask when your results will be ready and how to get them. You may need to call or meet with your provider to get your results. Keep all follow-up visits. Your provider will need to know  about any changes or any new symptoms. Contact a health care provider if: Your symptoms don't get better after 3 days. Your symptoms get worse. You have back pain or belly pain. You have a fever or chills. You throw up or feel like you may throw up. You throw up every time you take medicine. Get help right away if: You pass blood clots in your pee. You pass out. These symptoms may be an emergency. Call 911 right away. Do not wait to see if the symptoms will go away. Do not drive yourself to the hospital. This information is not intended to replace advice given to you by your health care provider. Make sure you discuss any questions you have with your health care provider. Document Revised: 12/25/2022 Document Reviewed: 12/04/2022 Elsevier Patient Education  2024 [employer].

## 2023-06-21 ENCOUNTER — Other Ambulatory Visit: Payer: Self-pay

## 2023-06-21 ENCOUNTER — Emergency Department (HOSPITAL_COMMUNITY)

## 2023-06-21 ENCOUNTER — Emergency Department (HOSPITAL_COMMUNITY)
Admission: EM | Admit: 2023-06-21 | Discharge: 2023-06-21 | Disposition: A | Discharge status: Home / Self Care | Attending: Emergency Medicine | Admitting: Emergency Medicine

## 2023-06-21 ENCOUNTER — Encounter (HOSPITAL_COMMUNITY): Payer: Self-pay | Admitting: *Deleted

## 2023-06-21 DIAGNOSIS — J449 Chronic obstructive pulmonary disease, unspecified: Secondary | ICD-10-CM | POA: Insufficient documentation

## 2023-06-21 DIAGNOSIS — I700 Atherosclerosis of aorta: Secondary | ICD-10-CM | POA: Diagnosis not present

## 2023-06-21 DIAGNOSIS — R509 Fever, unspecified: Secondary | ICD-10-CM | POA: Diagnosis not present

## 2023-06-21 DIAGNOSIS — Z7901 Long term (current) use of anticoagulants: Secondary | ICD-10-CM | POA: Insufficient documentation

## 2023-06-21 DIAGNOSIS — Z7951 Long term (current) use of inhaled steroids: Secondary | ICD-10-CM | POA: Insufficient documentation

## 2023-06-21 DIAGNOSIS — R059 Cough, unspecified: Secondary | ICD-10-CM | POA: Diagnosis not present

## 2023-06-21 DIAGNOSIS — J438 Other emphysema: Secondary | ICD-10-CM | POA: Diagnosis not present

## 2023-06-21 LAB — PROTIME-INR
INR: 1.4 — ABNORMAL HIGH (ref 0.8–1.2)
Prothrombin Time: 16.9 s — ABNORMAL HIGH (ref 11.4–15.2)

## 2023-06-21 LAB — CBC WITH DIFFERENTIAL/PLATELET
Abs Immature Granulocytes: 0.04 10*3/uL (ref 0.00–0.07)
Basophils Absolute: 0 10*3/uL (ref 0.0–0.1)
Basophils Relative: 0 %
Eosinophils Absolute: 0 10*3/uL (ref 0.0–0.5)
Eosinophils Relative: 0 %
HCT: 32.7 % — ABNORMAL LOW (ref 39.0–52.0)
Hemoglobin: 9.3 g/dL — ABNORMAL LOW (ref 13.0–17.0)
Immature Granulocytes: 0 %
Lymphocytes Relative: 9 %
Lymphs Abs: 0.8 10*3/uL (ref 0.7–4.0)
MCH: 28.3 pg (ref 26.0–34.0)
MCHC: 28.4 g/dL — ABNORMAL LOW (ref 30.0–36.0)
MCV: 99.4 fL (ref 80.0–100.0)
Monocytes Absolute: 0.4 10*3/uL (ref 0.1–1.0)
Monocytes Relative: 4 %
Neutro Abs: 8.3 10*3/uL — ABNORMAL HIGH (ref 1.7–7.7)
Neutrophils Relative %: 87 %
Platelets: 211 10*3/uL (ref 150–400)
RBC: 3.29 MIL/uL — ABNORMAL LOW (ref 4.22–5.81)
RDW: 14.3 % (ref 11.5–15.5)
WBC: 9.7 10*3/uL (ref 4.0–10.5)
nRBC: 0 % (ref 0.0–0.2)

## 2023-06-21 LAB — COMPREHENSIVE METABOLIC PANEL WITH GFR
ALT: 10 U/L (ref 0–44)
AST: 14 U/L — ABNORMAL LOW (ref 15–41)
Albumin: 3.4 g/dL — ABNORMAL LOW (ref 3.5–5.0)
Alkaline Phosphatase: 71 U/L (ref 38–126)
Anion gap: 8 (ref 5–15)
BUN: 18 mg/dL (ref 8–23)
CO2: 42 mmol/L — ABNORMAL HIGH (ref 22–32)
Calcium: 9.2 mg/dL (ref 8.9–10.3)
Chloride: 87 mmol/L — ABNORMAL LOW (ref 98–111)
Creatinine, Ser: 0.77 mg/dL (ref 0.61–1.24)
GFR, Estimated: >60 mL/min (ref >60)
Glucose, Bld: 166 mg/dL — ABNORMAL HIGH (ref 70–99)
Potassium: 4.1 mmol/L (ref 3.5–5.1)
Sodium: 137 mmol/L (ref 135–145)
Total Bilirubin: 0.5 mg/dL (ref 0.0–1.2)
Total Protein: 7.6 g/dL (ref 6.5–8.1)

## 2023-06-21 LAB — URINALYSIS, W/ REFLEX TO CULTURE (INFECTION SUSPECTED)
Bacteria, UA: NONE SEEN
Bilirubin Urine: NEGATIVE
Glucose, UA: NEGATIVE mg/dL
Ketones, ur: NEGATIVE mg/dL
Leukocytes,Ua: NEGATIVE
Nitrite: NEGATIVE
Protein, ur: 100 mg/dL — AB
RBC / HPF: >50 RBC/hpf (ref 0–5)
Specific Gravity, Urine: 1.023 (ref 1.005–1.030)
pH: 6 (ref 5.0–8.0)

## 2023-06-21 LAB — APTT: aPTT: 30 s (ref 24–36)

## 2023-06-21 LAB — LACTIC ACID, PLASMA
Lactic Acid, Venous: 1.2 mmol/L (ref 0.5–1.9)
Lactic Acid, Venous: 0.8 mmol/L (ref 0.5–1.9)

## 2023-06-21 MED ORDER — SODIUM CHLORIDE 0.9 % IV SOLN
2.0000 g | Freq: Once | INTRAVENOUS | Status: AC
  Administered 2023-06-21: 2 g via INTRAVENOUS
  Filled 2023-06-21: qty 20

## 2023-06-21 MED ORDER — METHYLPREDNISOLONE SODIUM SUCC 125 MG IJ SOLR
125.0000 mg | Freq: Once | INTRAMUSCULAR | Status: AC
  Administered 2023-06-21: 125 mg via INTRAVENOUS
  Filled 2023-06-21: qty 2

## 2023-06-21 MED ORDER — IPRATROPIUM-ALBUTEROL 0.5-2.5 (3) MG/3ML IN SOLN
3.0000 mL | Freq: Once | RESPIRATORY_TRACT | Status: AC
  Administered 2023-06-21: 3 mL via RESPIRATORY_TRACT
  Filled 2023-06-21: qty 3

## 2023-06-21 MED ORDER — HYDROCODONE-ACETAMINOPHEN 5-325 MG PO TABS
1.0000 | ORAL_TABLET | Freq: Once | ORAL | Status: AC
  Administered 2023-06-21: 1 via ORAL
  Filled 2023-06-21: qty 1

## 2023-06-21 MED ORDER — MAGNESIUM SULFATE 2 GM/50ML IV SOLN
2.0000 g | Freq: Once | INTRAVENOUS | Status: AC
  Administered 2023-06-21: 2 g via INTRAVENOUS
  Filled 2023-06-21: qty 50

## 2023-06-21 MED ORDER — AMOXICILLIN-POT CLAVULANATE 875-125 MG PO TABS: 1.0000 | ORAL_TABLET | Freq: Two times a day (BID) | ORAL | 0 refills | Status: DC

## 2023-06-21 MED ORDER — SODIUM CHLORIDE 0.9 % IV BOLUS
1000.0000 mL | Freq: Once | INTRAVENOUS | Status: AC
  Administered 2023-06-21: 1000 mL via INTRAVENOUS

## 2023-06-21 MED ORDER — ALBUTEROL SULFATE (2.5 MG/3ML) 0.083% IN NEBU
2.5000 mg | INHALATION_SOLUTION | Freq: Once | RESPIRATORY_TRACT | Status: AC
  Administered 2023-06-21: 2.5 mg via RESPIRATORY_TRACT
  Filled 2023-06-21: qty 3

## 2023-06-21 NOTE — ED Triage Notes (Signed)
 Pt fever, last took Tylenol about an hour ago.  Pt states he was sweating a lot last night.  Pt productive cough, pt thinks he has some kind of infection.  Pt is on [place] at 4 L/M and sats 82 % in triage.

## 2023-06-21 NOTE — ED Provider Notes (Signed)
 [place] EMERGENCY DEPARTMENT AT [facility] Provider Note   CSN: [id number] Arrival date & time: 06/21/23  1832     History {Add pertinent medical, surgical, social history, OB history to HPI:1} Chief Complaint  Patient presents with   Fever    David Stein is a 78 y.o. male.  Patient has COPD.  Patient complains of fever and cough.  He has been on doxycycline for couple days   Fever      Home Medications Prior to Admission medications   Medication Sig Start Date End Date Taking? Authorizing Provider  amoxicillin-clavulanate (AUGMENTIN) 875-125 MG tablet Take 1 tablet by mouth every 12 (twelve) hours. 06/21/23  Yes Bethann Berkshire, MD  acetaminophen (TYLENOL) 500 MG tablet Take 500 mg by mouth every 6 (six) hours as needed for mild pain.    [provider]  ALPRAZolam Prudy Feeler) 0.5 MG tablet Take 0.5 mg by mouth every 8 (eight) hours as needed for anxiety. 06/11/20   [provider]  amiodarone (PACERONE) 200 MG tablet Take 1 tablet (200 mg total) by mouth daily. 04/07/22   Marinus Maw, MD  antiseptic oral rinse (BIOTENE) LIQD 15 mLs by Mouth Rinse route as needed for dry mouth.    [provider]  apixaban (ELIQUIS) 5 MG TABS tablet Take 1 tablet (5 mg total) by mouth 2 (two) times daily. 12/14/15   Langston Reusing, MD  Budeson-Glycopyrrol-Formoterol (BREZTRI AEROSPHERE) 160-9-4.8 MCG/ACT AERO Inhale 2 puffs into the lungs in the morning and at bedtime.    [provider]  carboxymethylcellulose 1 % ophthalmic solution Place 1 drop into both eyes daily as needed (dry eyes).    [provider]  diltiazem (CARDIZEM CD) 240 MG 24 hr capsule Take 1 capsule (240 mg total) by mouth daily. 07/01/22   Marinus Maw, MD  Elastic Bandages & Supports (M-4 KNEE HIGH STOCKINGS) MISC 1 each by Does not apply route as directed. 08/02/20   Antoine Poche, MD  finasteride (PROSCAR) 5 MG tablet Take 1 tablet (5 mg total) by mouth  daily. 06/01/23   McKenzie, Mardene Celeste, MD  fluticasone (FLONASE) 50 MCG/ACT nasal spray Place 1 spray into both nostrils daily.    [provider]  furosemide (LASIX) 20 MG tablet Take 1 tablet (20 mg total) by mouth daily. 03/23/23   Antoine Poche, MD  guaiFENesin (MUCINEX) 600 MG 12 hr tablet Take 1 tablet (600 mg total) by mouth 2 (two) times daily. 09/20/21   Shon Hale, MD  HYDROcodone-acetaminophen (NORCO) 10-325 MG tablet Take 1 tablet by mouth 4 (four) times daily as needed for moderate pain (pain score 4-6). 12/10/21   [provider]  levothyroxine (SYNTHROID, LEVOTHROID) 50 MCG tablet Take 50 mcg by mouth daily before breakfast.    [provider]  MAGNESIUM CHLORIDE PO Take 1 tablet by mouth daily.    [provider]  Multiple Vitamin (MULTIVITAMIN WITH MINERALS) TABS tablet Take 1 tablet by mouth daily.    [provider]  OXYGEN Inhale 4-4.5 L into the lungs continuous.    [provider]  pantoprazole (PROTONIX) 40 MG tablet Take 1 tablet (40 mg total) by mouth daily. 01/23/23 01/23/24  Shon Hale, MD  polyethylene glycol (MIRALAX / GLYCOLAX) 17 g packet Take 17 g by mouth daily.    [provider]  predniSONE (DELTASONE) 10 MG tablet Take 10 mg by mouth daily with breakfast. 08/30/21   [provider]  [facility]  108 (90 Base) MCG/ACT inhaler Inhale 1 puff into the lungs every 6 (six) hours as needed for wheezing or shortness of breath. 09/20/21   Shon Hale, MD  rosuvastatin (CRESTOR) 10 MG tablet Take 10 mg by mouth daily. 06/12/20   [provider]  senna (SENOKOT) 8.6 MG tablet Take 1 tablet by mouth daily. Patient not taking: Reported on 03/12/2023    [provider]  tamsulosin (FLOMAX) 0.4 MG CAPS capsule Take 1 capsule (0.4 mg total) by mouth daily after supper. 06/01/23   McKenzie, Mardene Celeste, MD      Allergies    Codeine    Review of Systems   Review of Systems   Constitutional:  Positive for fever.    Physical Exam Updated Vital Signs BP (!) 123/44 (BP Location: Right Arm)   Pulse 78   Temp 98.8 F (37.1 C) (Oral)   Resp 17   Ht 6\' 2"  (1.88 m)   Wt 84.8 kg   SpO2 91%   BMI 24.01 kg/m  Physical Exam  ED Results / Procedures / Treatments   Labs (all labs ordered are listed, but only abnormal results are displayed) Labs Reviewed  COMPREHENSIVE METABOLIC PANEL WITH GFR - Abnormal; Notable for the following components:      Result Value   Chloride 87 (*)    CO2 42 (*)    Glucose, Bld 166 (*)    Albumin 3.4 (*)    AST 14 (*)    All other components within normal limits  CBC WITH DIFFERENTIAL/PLATELET - Abnormal; Notable for the following components:   RBC 3.29 (*)    Hemoglobin 9.3 (*)    HCT 32.7 (*)    MCHC 28.4 (*)    Neutro Abs 8.3 (*)    All other components within normal limits  PROTIME-INR - Abnormal; Notable for the following components:   Prothrombin Time 16.9 (*)    INR 1.4 (*)    All other components within normal limits  URINALYSIS, W/ REFLEX TO CULTURE (INFECTION SUSPECTED) - Abnormal; Notable for the following components:   Color, Urine AMBER (*)    APPearance CLOUDY (*)    Hgb urine dipstick MODERATE (*)    Protein, ur 100 (*)    All other components within normal limits  CULTURE, BLOOD (ROUTINE X 2)  CULTURE, BLOOD (ROUTINE X 2)  URINE CULTURE  LACTIC ACID, PLASMA  LACTIC ACID, PLASMA  APTT    EKG None  Radiology DG Chest Portable 1 View Result Date: 06/21/2023 CLINICAL DATA:  Cough, fever EXAM: PORTABLE CHEST 1 VIEW COMPARISON:  01/21/2023 FINDINGS: There is hyperinflation of the lungs compatible with COPD. Scarring in the lung bases. No acute confluent airspace opacities. Heart mediastinal contours within normal limits. Aortic atherosclerosis. No effusions. IMPRESSION: COPD/chronic changes.  No active disease. Electronically Signed   By: Charlett Nose M.D.   On: 06/21/2023 19:21     Procedures Procedures  {Document cardiac monitor, telemetry assessment procedure when appropriate:1}  Medications Ordered in ED Medications  sodium chloride 0.9 % bolus 1,000 mL (0 mLs Intravenous Stopped 06/21/23 2100)  cefTRIAXone (ROCEPHIN) 2 g in sodium chloride 0.9 % 100 mL IVPB (0 g Intravenous Stopped 06/21/23 2026)  ipratropium-albuterol (DUONEB) 0.5-2.5 (3) MG/3ML nebulizer solution 3 mL (3 mLs Nebulization Given 06/21/23 1950)  albuterol (PROVENTIL) (2.5 MG/3ML) 0.083% nebulizer solution 2.5 mg (2.5 mg Nebulization Given 06/21/23 1950)  magnesium sulfate IVPB 2 g 50 mL (0 g Intravenous Stopped 06/21/23 2127)  methylPREDNISolone sodium succinate (  SOLU-MEDROL) 125 mg/2 mL injection 125 mg (125 mg Intravenous Given 06/21/23 1927)  HYDROcodone-acetaminophen (NORCO/VICODIN) 5-325 MG per tablet 1 tablet (1 tablet Oral Given 06/21/23 2137)    ED Course/ Medical Decision Making/ A&P   {   Click here for ABCD2, HEART and other calculatorsREFRESH Note before signing :1}                              Medical Decision Making Amount and/or Complexity of Data Reviewed Labs: ordered. Radiology: ordered.  Risk Prescription drug management.   COPD exacerbation and possible respiratory infection.  Patient is stable and will be placed on Augmentin to take along with his doxycycline {Document critical care time when appropriate:1} {Document review of labs and clinical decision tools ie heart score, Chads2Vasc2 etc:1}  {Document your independent review of radiology images, and any outside records:1} {Document your discussion with family members, caretakers, and with consultants:1} {Document social determinants of health affecting pt's care:1} {Document your decision making why or why not admission, treatments were needed:1} Final Clinical Impression(s) / ED Diagnoses Final diagnoses:  Other emphysema (HCC)    Rx / DC Orders ED Discharge Orders          Ordered     amoxicillin-clavulanate (AUGMENTIN) 875-125 MG tablet  Every 12 hours        06/21/23 2153

## 2023-06-21 NOTE — Discharge Instructions (Signed)
 Follow-up with your doctor in few days for recheck

## 2023-06-23 DIAGNOSIS — J439 Emphysema, unspecified: Secondary | ICD-10-CM | POA: Diagnosis not present

## 2023-06-23 DIAGNOSIS — J449 Chronic obstructive pulmonary disease, unspecified: Secondary | ICD-10-CM | POA: Diagnosis not present

## 2023-06-23 LAB — URINE CULTURE: Culture: NO GROWTH

## 2023-06-26 LAB — CULTURE, BLOOD (ROUTINE X 2)
Special Requests: ADEQUATE
Special Requests: ADEQUATE

## 2023-06-28 DIAGNOSIS — J9612 Chronic respiratory failure with hypercapnia: Secondary | ICD-10-CM | POA: Diagnosis not present

## 2023-06-28 DIAGNOSIS — J449 Chronic obstructive pulmonary disease, unspecified: Secondary | ICD-10-CM | POA: Diagnosis not present

## 2023-06-28 DIAGNOSIS — J479 Bronchiectasis, uncomplicated: Secondary | ICD-10-CM | POA: Diagnosis not present

## 2023-06-30 ENCOUNTER — Telehealth: Payer: Medicare Other | Admitting: Cardiology

## 2023-07-27 ENCOUNTER — Ambulatory Visit: Admitting: Internal Medicine

## 2023-08-03 ENCOUNTER — Ambulatory Visit: Payer: Medicare Other | Admitting: Primary Care

## 2023-08-23 DEATH — deceased

## 2023-11-18 ENCOUNTER — Other Ambulatory Visit (HOSPITAL_COMMUNITY)

## 2023-11-26 ENCOUNTER — Other Ambulatory Visit (HOSPITAL_COMMUNITY): Payer: Medicare Other

## 2023-12-02 ENCOUNTER — Ambulatory Visit: Admitting: Urology
